# Patient Record
Sex: Female | Born: 1935 | Race: White | Hispanic: No | Smoking: Former smoker
Health system: Southern US, Community
[De-identification: ages and names within clinical notes are randomized; demographics above are authoritative.]

## PROBLEM LIST (undated history)

## (undated) DIAGNOSIS — I739 Peripheral vascular disease, unspecified: Secondary | ICD-10-CM

## (undated) DIAGNOSIS — M1990 Unspecified osteoarthritis, unspecified site: Secondary | ICD-10-CM

## (undated) DIAGNOSIS — I10 Essential (primary) hypertension: Secondary | ICD-10-CM

## (undated) DIAGNOSIS — K219 Gastro-esophageal reflux disease without esophagitis: Secondary | ICD-10-CM

## (undated) DIAGNOSIS — G629 Polyneuropathy, unspecified: Secondary | ICD-10-CM

## (undated) DIAGNOSIS — I4891 Unspecified atrial fibrillation: Secondary | ICD-10-CM

## (undated) DIAGNOSIS — M810 Age-related osteoporosis without current pathological fracture: Secondary | ICD-10-CM

## (undated) DIAGNOSIS — L97509 Non-pressure chronic ulcer of other part of unspecified foot with unspecified severity: Secondary | ICD-10-CM

## (undated) HISTORY — PX: OTHER SURGICAL HISTORY: SHX169

## (undated) HISTORY — PX: ABDOMINAL HYSTERECTOMY: SHX81

## (undated) HISTORY — PX: TONSILLECTOMY: SHX5217

## (undated) HISTORY — PX: STENT PLACEMENT ILIAC (ARMC HX): HXRAD1735

---

## 2004-10-24 ENCOUNTER — Ambulatory Visit: Payer: Self-pay | Admitting: Internal Medicine

## 2005-10-31 ENCOUNTER — Ambulatory Visit: Payer: Self-pay | Admitting: Internal Medicine

## 2006-05-22 ENCOUNTER — Ambulatory Visit: Payer: Self-pay | Admitting: Gastroenterology

## 2006-08-22 ENCOUNTER — Ambulatory Visit: Payer: Self-pay | Admitting: Gastroenterology

## 2006-08-22 ENCOUNTER — Other Ambulatory Visit: Payer: Self-pay

## 2006-11-03 ENCOUNTER — Ambulatory Visit: Payer: Self-pay | Admitting: Internal Medicine

## 2007-11-05 ENCOUNTER — Ambulatory Visit: Payer: Self-pay | Admitting: Internal Medicine

## 2008-11-17 ENCOUNTER — Ambulatory Visit: Payer: Self-pay | Admitting: Internal Medicine

## 2009-08-05 ENCOUNTER — Emergency Department: Payer: Self-pay | Admitting: Internal Medicine

## 2009-11-29 ENCOUNTER — Ambulatory Visit: Payer: Self-pay | Admitting: Internal Medicine

## 2012-07-11 ENCOUNTER — Emergency Department: Payer: Self-pay | Admitting: Emergency Medicine

## 2012-12-15 ENCOUNTER — Ambulatory Visit: Payer: Self-pay | Admitting: Vascular Surgery

## 2012-12-15 LAB — BASIC METABOLIC PANEL
Anion Gap: 6 — ABNORMAL LOW (ref 7–16)
Chloride: 109 mmol/L — ABNORMAL HIGH (ref 98–107)
Creatinine: 0.77 mg/dL (ref 0.60–1.30)
Osmolality: 281 (ref 275–301)
Potassium: 4.2 mmol/L (ref 3.5–5.1)
Sodium: 140 mmol/L (ref 136–145)

## 2012-12-15 LAB — CREATININE, SERUM
Creatinine: 1 mg/dL (ref 0.60–1.30)
EGFR (African American): 59 — ABNORMAL LOW
EGFR (Non-African Amer.): 51 — ABNORMAL LOW

## 2012-12-16 LAB — CBC WITH DIFFERENTIAL/PLATELET
Basophil #: 0 10*3/uL (ref 0.0–0.1)
Basophil %: 0.3 %
Eosinophil %: 0.1 %
HGB: 9.6 g/dL — ABNORMAL LOW (ref 12.0–16.0)
Lymphocyte %: 18.3 %
MCH: 32.2 pg (ref 26.0–34.0)
MCHC: 35 g/dL (ref 32.0–36.0)
MCV: 92 fL (ref 80–100)
Monocyte #: 0.6 x10 3/mm (ref 0.2–0.9)
Monocyte %: 7.5 %
RDW: 13.9 % (ref 11.5–14.5)
WBC: 8.4 10*3/uL (ref 3.6–11.0)

## 2012-12-17 LAB — BASIC METABOLIC PANEL
Anion Gap: 8 (ref 7–16)
BUN: 13 mg/dL (ref 7–18)
Calcium, Total: 8.2 mg/dL — ABNORMAL LOW (ref 8.5–10.1)
Co2: 26 mmol/L (ref 21–32)
EGFR (African American): >60
EGFR (Non-African Amer.): >60
Potassium: 3.8 mmol/L (ref 3.5–5.1)
Sodium: 142 mmol/L (ref 136–145)

## 2013-04-16 ENCOUNTER — Encounter: Payer: Self-pay | Admitting: Nurse Practitioner

## 2013-04-16 ENCOUNTER — Encounter: Payer: Self-pay | Admitting: Cardiothoracic Surgery

## 2013-05-02 ENCOUNTER — Encounter: Payer: Self-pay | Admitting: Cardiothoracic Surgery

## 2013-05-02 ENCOUNTER — Encounter: Payer: Self-pay | Admitting: Nurse Practitioner

## 2013-05-03 ENCOUNTER — Ambulatory Visit: Payer: Self-pay | Admitting: Vascular Surgery

## 2013-05-03 LAB — BUN: BUN: 25 mg/dL — ABNORMAL HIGH (ref 7–18)

## 2013-05-03 LAB — CREATININE, SERUM: EGFR (Non-African Amer.): 55 — ABNORMAL LOW

## 2013-06-01 ENCOUNTER — Encounter: Payer: Self-pay | Admitting: Nurse Practitioner

## 2013-06-01 ENCOUNTER — Encounter: Payer: Self-pay | Admitting: Cardiothoracic Surgery

## 2013-07-02 ENCOUNTER — Encounter: Payer: Self-pay | Admitting: Nurse Practitioner

## 2013-07-02 ENCOUNTER — Encounter: Payer: Self-pay | Admitting: Cardiothoracic Surgery

## 2013-08-02 ENCOUNTER — Encounter: Payer: Self-pay | Admitting: Cardiothoracic Surgery

## 2013-11-10 DIAGNOSIS — L97209 Non-pressure chronic ulcer of unspecified calf with unspecified severity: Secondary | ICD-10-CM | POA: Insufficient documentation

## 2013-12-14 DIAGNOSIS — M79609 Pain in unspecified limb: Secondary | ICD-10-CM | POA: Insufficient documentation

## 2014-05-25 DIAGNOSIS — I70229 Atherosclerosis of native arteries of extremities with rest pain, unspecified extremity: Secondary | ICD-10-CM | POA: Insufficient documentation

## 2014-05-25 DIAGNOSIS — L03119 Cellulitis of unspecified part of limb: Secondary | ICD-10-CM

## 2014-05-25 DIAGNOSIS — L02619 Cutaneous abscess of unspecified foot: Secondary | ICD-10-CM | POA: Insufficient documentation

## 2014-05-25 DIAGNOSIS — I998 Other disorder of circulatory system: Secondary | ICD-10-CM | POA: Insufficient documentation

## 2014-09-13 DIAGNOSIS — L97909 Non-pressure chronic ulcer of unspecified part of unspecified lower leg with unspecified severity: Secondary | ICD-10-CM | POA: Insufficient documentation

## 2015-03-24 NOTE — Discharge Summary (Signed)
PATIENT NAME:  Danielle OppenheimGRAHAM, Arna T MR#:  [id number] DATE OF BIRTH:  February 14, 1925  DATE OF ADMISSION:  12/15/2012 DATE OF DISCHARGE:  12/17/2012  DISCHARGE DIAGNOSIS:  1. Atherosclerotic occlusive disease, bilateral lower extremities, with rest pain and ulceration of the left lower extremity.  2. Hematoma complicating procedure with a right groin hematoma.   SECONDARY DIAGNOSES: 1. Hypertension.  2. Parkinson's.  3. Anxiety disorder.  4. Anemia of blood loss.  5. Diffuse degenerative joint disease.  6. Gastroesophageal reflux disease.   PROCEDURES PERFORMED: 1. Left lower extremity revascularization with percutaneous transluminal angioplasty, stent placement of the left external iliac artery, percutaneous transluminal angioplasty of the left superficial femoral artery.  2. Angiography performed via right groin approach.   CONSULTATIONS: None.   INDICATIONS: The patient is an 81 year old woman who presented to the office the day before her procedure with intense rest pain as well as ulceration of the left lower foot. Her left leg was clearly ischemic and in jeopardy, and therefore she was scheduled for angiography with the hope for intervention and revascularization for limb salvage.   HOSPITAL COURSE: On the day of admission, the patient underwent successful revascularization. Stents were placed in the above noted locations, and perfusion was significantly improved to the left foot. Attempts at using a closure device failed, and the patient sustained a moderate sized hematoma. Concomitant with this, the patient was also experiencing significant urinary retention and at the conclusion of the procedure became bradycardic as well as hypotensive. Initially, in and out catheterization resulted in improvement of her symptoms, however, they did recur after approximately 1 hour, at which point a full CT scan was obtained to help determine what was going on.  CT scan demonstrated the right groin hematoma,  moderate size, but also significant urinary retention in spite of having placed a Foley prior to the CT. Therefore, the Foley was taken down, reinserted, and this time over a liter of urine was returned. Postoperatively, the patient was taken to the intensive care unit where she did well overnight. On postoperative day #1, her  blood pressure was stable in the low 100s; however, Physical Therapy found that she was intolerant of weight-bearing on her left foot secondary to the pain. She was experiencing significant reperfusion symptoms and hyperemia of her foot. Doppler signals were excellent. After therapy evaluation, it was felt the patient would benefit from short-term rehabilitation and PT, and, therefore, she was transferred from the unit to the floor on post procedure day #2. [facility] She is actually starting to ambulate better. She was able to cruise around the room short distances from using furniture as an aid.  Her pain is much better, and she has not been using large quantities of narcotics. In fact, she at this point has had just 2 doses of narcotics this day. She is otherwise doing very well and felt fit for discharge to rehab.   DISPOSITION: She will be discharged to skilled nursing/ rehab. She is in improved condition. Her left foot is now warm and well perfused. She is weight-bearing as tolerated. Diet is regular. Medications are as noted in the discharge instructions.  ____________________________ Renford DillsGregory G. Lumi Winslett, MD ggs:cb D: 12/17/2012 16:39:27 ET T: 12/17/2012 17:16:49 ET JOB#: [id number]  cc: Renford DillsGregory G. Zeta Bucy, MD, <Dictator> Barbette ReichmannVishwanath Hande, MD [facility] Renford DillsGREGORY G Kathyrn Warmuth MD ELECTRONICALLY SIGNED 12/25/2012 10:02

## 2015-03-24 NOTE — Op Note (Signed)
PATIENT NAME:  [name], [name] MR#:  [id number] DATE OF BIRTH:  [birth year]  DATE OF PROCEDURE:  12/15/2012  PREOPERATIVE DIAGNOSES:   1.  Atherosclerotic occlusive disease, bilateral lower extremities, with ulceration and rest pain of the left lower extremity.  2.  Venous insufficiency.  3.  Varicose veins with venous ulceration, left ankle.   POSTOPERATIVE DIAGNOSES:   1.  Atherosclerotic occlusive disease, bilateral lower extremities, with ulceration and rest pain of the left lower extremity.  2.  Venous insufficiency.  3.  Varicose veins with venous ulceration, left ankle.   PROCEDURES PERFORMED:   1.  Abdominal aortogram.  2.  Left lower extremity distal runoff, third order catheter placement.  3.  Crosser atherectomy, left SFA.  4.  Percutaneous transluminal angioplasty and stent placement, left SFA.  5.  Percutaneous transluminal angioplasty and stent placement, left external iliac artery.   SURGEON:  Renford Dills, MD   SEDATION:  Versed 5 mg plus fentanyl 200 mcg administered IV. Continuous ECG, pulse oximetry and cardiopulmonary monitoring is performed throughout the entire procedure by the interventional radiology nurse. Total sedation time was 1 hour and 40 minutes.   ACCESS:  7 French sheath, right common femoral artery.   CONTRAST USED:  Isovue 110 mL.   FLUOROSCOPY TIME:  19.1 minutes.   INDICATIONS:  The patient is an 81 year old woman who presented to the office with increased pain in her left lower extremity associated with an ulcer that has been present since August. Physical examination demonstrated lack of pulses and trophic changes along with venous stasis changes. The patient appears to have a mixed ulcer which originated as a venous ulcer, but is now nonhealing secondary to her atherosclerotic occlusive disease. She is also describing dangling her leg off the side of the bed at night and increasing pain consistently throughout the day which has lessened  slightly with dependency, all suggestive of rest pain. The risks and benefits for angiography and the hope for intervention were reviewed. All questions have been answered. The patient has agreed to proceed.   DESCRIPTION OF PROCEDURE:  The patient is taken to special procedures and placed in the supine position. After adequate sedation is achieved, the right groin is prepped and draped in sterile fashion. Ultrasound is placed in a sterile sleeve. Ultrasound is utilized secondary to lack of appropriate landmarks and to avoid vascular injury. Under direct ultrasound visualization, the common femoral artery is identified. It is scanned distally to demonstrate the bifurcation and then scanned more proximally to select the common femoral proper. The artery is noted to be echolucent, homogeneous and pulsatile indicating patency. Image is recorded for the permanent record and after 1% lidocaine is infiltrated in the soft tissues under direct ultrasound visualization, a micropuncture needle is inserted, microwire followed micro sheath, J-wire followed by 5 French sheath and 5 French pigtail catheter. The pigtail catheter is positioned at the level of T12 and AP projection of the aorta is obtained. After review of the images, pigtail catheter is repositioned to just above the bifurcation and RAO projection of the pelvis is obtained. Rim catheter and stiff angled Glidewire are then used to cross the bifurcation. The catheter is advanced down into the profunda and hand injection of contrast is then used to evaluate the left lower extremity distal runoff. Flush occlusion of the SFA is noted with reconstitution of the above knee or at knee popliteal. There appears to be 2-vessel runoff to the foot.   Then 4000 units of heparin  are given and the sheath is then upsized to a 7 [place]French Ansel. The Ansel is advanced up and over the bifurcation positioned with its tip in the common femoral. An angled Usher catheter was then  utilized in combination with a S6 device in the Crosser atherectomy which has been prepped on the field. The S6 Usher catheter is then used to engage the SFA and the Crosser catheter is advanced down to the mid popliteal. Subsequently, a combination of catheters and Glidewires are used to negotiate this and the catheter is advanced down into the distal popliteal where hand injection of contrast is used to perform distal runoff and verify intraluminal placement. A 0.014 wire is reintroduced and a 3.5 x 30 balloon is used to angioplasty the proximal popliteal and SFA after 2 serial angioplasties. Followup angiography demonstrates there is a high-grade residual stenosis in the proximal SFA along with a dissection and therefore, a 5 x 80 LifeStent is deployed across this region. Its leading edge is approximately 2 to 3 cm below the bifurcation. It is post dilated with a 4 x 80 balloon.   Attention is then turned down to the reentry at Hunter's canal. There still appears to be some haziness and the 3.5 balloon is once again used to angioplasty this area. A followup angiography demonstrates an excellent result without flow limitation. The sheath is then pulled into the common iliac. Magnified oblique views of the external iliac are obtained demonstrating a midportion greater than 70% stenosis and a 6 x 40 LIFESTAR stent is deployed across this and postdilated to 5 mm. A sheath is then pulled into the external on the right and subsequently an oblique view is obtained. ProGlide is attempted. The knot does not capture and a Mynx device is deployed. Mynx appears to be hemostatic; however, the patient did become bradycardic and her blood pressure did drop, but she was noted to have a very large bladder and this situation was resolved with 1 dose of atropine, a small bolus of fluids and subsequently an in-and-out catheter which returned over a liter of urine.   The patient tolerated the procedure otherwise quite well and  was taken to the recovery area in excellent condition.   INTERPRETATION:  There are diffuse atherosclerotic changes throughout. There are no hemodynamically significant lesions within the aorta or the common iliac arteries bilaterally. There is a mid-external iliac on the left lesion of approximately 70%. Common femoral is patent as is the profunda. The superficial femoral artery is a flush occlusion at the common femoral with reconstitution of the mid popliteal. There appears to be 2-vessel runoff. Following angioplasty to 3.5 mm and subsequently a stent and angioplasty at 4 mm proximally, there is patency now of the SFA and popliteal. Following angioplasty and stent placement in the external iliac, there is complete resolution of the stenosis.   SUMMARY:  Successful revascularization of the left lower extremity as described above.   ____________________________ Renford DillsGregory G. Schnier, MD ggs:si D: 12/15/2012 17:59:00 ET T: 12/16/2012 00:01:35 ET JOB#: [id number]  cc: Renford DillsGregory G. Schnier, MD, <Dictator> Barbette ReichmannVishwanath Hande, MD  Renford DillsGREGORY G SCHNIER MD ELECTRONICALLY SIGNED 12/25/2012 10:01

## 2015-03-24 NOTE — Op Note (Signed)
PATIENT NAME:  [name], [name] MR#:  [id number] DATE OF BIRTH:  [birth year]  DATE OF PROCEDURE:  05/03/2013  PREOPERATIVE DIAGNOSES:  1.  Peripheral arterial disease with ulceration.  2.  Hypertension.   POSTOPERATIVE DIAGNOSES: 1.  Peripheral arterial disease with ulceration.  2.  Hypertension.   PROCEDURE:  1.  Catheter placement into left popliteal artery from right femoral approach.  2.  Aortogram, selective left lower extremity angiogram.  3.  Percutaneous transluminal angioplasty of entire left superficial femoral artery and above-knee popliteal artery with 4 mm diameter angioplasty balloon.  4.  Percutaneous transluminal angioplasty of proximal superficial femoral artery with 5 mm diameter angioplasty balloon.  5.  Self-expanding stent placement to mid superficial femoral artery with 5 mm diameter x 12 cm self-expanding stent for greater than 50% residual stenosis and dissection after angioplasty.  6.  StarClose closure device, right femoral artery.   SURGEON: Annice NeedyJason S Dew, M.D.   ANESTHESIA: Local with moderate conscious sedation.   ESTIMATED BLOOD LOSS: Minimal.   INDICATION FOR PROCEDURE: An 81 year old white female with severe peripheral vascular disease. She has had previous intervention. She has nonhealing ulceration of left lower extremity. She is brought back for an attempt at revascularization. Risks and benefits were discussed. Informed consent was obtained.   DESCRIPTION OF PROCEDURE: The patient is brought to the vascular interventional radiology suite. Groins were shaved and prepped and a sterile surgical field was created. The right femoral head was localized with fluoroscopy. The right femoral artery was accessed without difficulty with  a Seldinger needle. A J-wire and 5-French sheath were placed. Pigtail catheter was placed at noted at the L1 level and AP aortogram was performed. This showed some diffuse aortoiliac calcification with some mild stenosis in the iliacs  that was not flow limiting. I then hooked the aortic bifurcation and advanced to the left femoral head and selective left lower extremity angiogram was then performed. This showed basically a string sign of the majority of the SFA. The previous left SFA stent was actually open. The above-knee popliteal artery reconstituted just above the knee and she then had two-vessel runoff distally. The patient was systemically heparinized. A 6-French Ansel sheath was placed over a Terumo Advantage wire that would cross the lesion without difficulty and confirm intraluminal flow in the popliteal artery at the level of the knee. I then replaced the wire. A 4 mm diameter angioplasty balloon was inflated from just above the knee to the common femoral artery. In the upper leg, a 5 mm diameter angioplasty balloon was also used. There was still residual disease just below the previously placed stent and a 5 mm diameter x 12 cm self-expanding stent was used to encompass the lesion with dissection greater than 50% residual stenosis. This was ironed out with a 5 mm balloon. The angiographic result was good. Sheaths were pulled back to the ipsilateral external iliac artery. Oblique atrium was performed. StarClose closure device was deployed in the usual fashion and excellent hemostasis was achieved. The patient was then awakened from anesthesia and taken to the recovery room in stable condition having tolerated the procedure well.   ____________________________ Annice NeedyJason S. Dew, MD jsd:aw D: 05/03/2013 09:40:02 ET Good: 05/03/2013 10:30:30 ET JOB#: [id number]  cc: Annice NeedyJason S. Dew, MD, <Dictator> Barbette ReichmannVishwanath Hande, MD Annice NeedyJASON S DEW MD ELECTRONICALLY SIGNED 05/12/2013 10:55

## 2015-06-09 DIAGNOSIS — M810 Age-related osteoporosis without current pathological fracture: Secondary | ICD-10-CM | POA: Insufficient documentation

## 2015-06-09 DIAGNOSIS — K589 Irritable bowel syndrome without diarrhea: Secondary | ICD-10-CM | POA: Insufficient documentation

## 2015-10-23 ENCOUNTER — Emergency Department: Payer: Medicare Other

## 2015-10-23 ENCOUNTER — Inpatient Hospital Stay: Payer: Medicare Other

## 2015-10-23 ENCOUNTER — Inpatient Hospital Stay
Admission: EM | Admit: 2015-10-23 | Discharge: 2015-10-25 | DRG: 291 | Disposition: A | Discharge status: Home / Self Care | Payer: Medicare Other | Attending: Internal Medicine | Admitting: Internal Medicine

## 2015-10-23 ENCOUNTER — Encounter: Payer: Self-pay | Admitting: Emergency Medicine

## 2015-10-23 DIAGNOSIS — I509 Heart failure, unspecified: Secondary | ICD-10-CM

## 2015-10-23 DIAGNOSIS — I739 Peripheral vascular disease, unspecified: Secondary | ICD-10-CM | POA: Diagnosis present

## 2015-10-23 DIAGNOSIS — I071 Rheumatic tricuspid insufficiency: Secondary | ICD-10-CM | POA: Diagnosis present

## 2015-10-23 DIAGNOSIS — Z87891 Personal history of nicotine dependence: Secondary | ICD-10-CM | POA: Diagnosis not present

## 2015-10-23 DIAGNOSIS — Z885 Allergy status to narcotic agent status: Secondary | ICD-10-CM | POA: Diagnosis not present

## 2015-10-23 DIAGNOSIS — Z7902 Long term (current) use of antithrombotics/antiplatelets: Secondary | ICD-10-CM

## 2015-10-23 DIAGNOSIS — I5033 Acute on chronic diastolic (congestive) heart failure: Secondary | ICD-10-CM | POA: Diagnosis present

## 2015-10-23 DIAGNOSIS — J9601 Acute respiratory failure with hypoxia: Secondary | ICD-10-CM | POA: Diagnosis present

## 2015-10-23 DIAGNOSIS — G629 Polyneuropathy, unspecified: Secondary | ICD-10-CM | POA: Diagnosis present

## 2015-10-23 DIAGNOSIS — I482 Chronic atrial fibrillation: Secondary | ICD-10-CM | POA: Diagnosis present

## 2015-10-23 DIAGNOSIS — L8990 Pressure ulcer of unspecified site, unspecified stage: Secondary | ICD-10-CM | POA: Insufficient documentation

## 2015-10-23 DIAGNOSIS — Z888 Allergy status to other drugs, medicaments and biological substances status: Secondary | ICD-10-CM | POA: Diagnosis not present

## 2015-10-23 DIAGNOSIS — I110 Hypertensive heart disease with heart failure: Secondary | ICD-10-CM | POA: Diagnosis not present

## 2015-10-23 DIAGNOSIS — Z881 Allergy status to other antibiotic agents status: Secondary | ICD-10-CM | POA: Diagnosis not present

## 2015-10-23 DIAGNOSIS — M810 Age-related osteoporosis without current pathological fracture: Secondary | ICD-10-CM | POA: Diagnosis present

## 2015-10-23 DIAGNOSIS — M1990 Unspecified osteoarthritis, unspecified site: Secondary | ICD-10-CM | POA: Diagnosis present

## 2015-10-23 DIAGNOSIS — R0902 Hypoxemia: Secondary | ICD-10-CM

## 2015-10-23 DIAGNOSIS — R0602 Shortness of breath: Secondary | ICD-10-CM | POA: Diagnosis not present

## 2015-10-23 DIAGNOSIS — Z882 Allergy status to sulfonamides status: Secondary | ICD-10-CM | POA: Diagnosis not present

## 2015-10-23 DIAGNOSIS — J069 Acute upper respiratory infection, unspecified: Secondary | ICD-10-CM | POA: Diagnosis present

## 2015-10-23 DIAGNOSIS — L89522 Pressure ulcer of left ankle, stage 2: Secondary | ICD-10-CM | POA: Diagnosis present

## 2015-10-23 DIAGNOSIS — K219 Gastro-esophageal reflux disease without esophagitis: Secondary | ICD-10-CM | POA: Diagnosis present

## 2015-10-23 DIAGNOSIS — Z79899 Other long term (current) drug therapy: Secondary | ICD-10-CM | POA: Diagnosis not present

## 2015-10-23 HISTORY — DX: Unspecified osteoarthritis, unspecified site: M19.90

## 2015-10-23 HISTORY — DX: Unspecified atrial fibrillation: I48.91

## 2015-10-23 HISTORY — DX: Polyneuropathy, unspecified: G62.9

## 2015-10-23 HISTORY — DX: Non-pressure chronic ulcer of other part of unspecified foot with unspecified severity: L97.509

## 2015-10-23 HISTORY — DX: Age-related osteoporosis without current pathological fracture: M81.0

## 2015-10-23 HISTORY — DX: Peripheral vascular disease, unspecified: I73.9

## 2015-10-23 HISTORY — DX: Gastro-esophageal reflux disease without esophagitis: K21.9

## 2015-10-23 LAB — CBC
HCT: 43.2 % (ref 35.0–47.0)
HEMOGLOBIN: 14.1 g/dL (ref 12.0–16.0)
MCH: 30.3 pg (ref 26.0–34.0)
MCHC: 32.6 g/dL (ref 32.0–36.0)
MCV: 92.9 fL (ref 80.0–100.0)
Platelets: 157 10*3/uL (ref 150–440)
RBC: 4.66 MIL/uL (ref 3.80–5.20)
RDW: 15.1 % — AB (ref 11.5–14.5)
WBC: 6.4 10*3/uL (ref 3.6–11.0)

## 2015-10-23 LAB — BASIC METABOLIC PANEL
Anion gap: 8 (ref 5–15)
BUN: 16 mg/dL (ref 6–20)
CO2: 21 mmol/L — ABNORMAL LOW (ref 22–32)
CREATININE: 0.91 mg/dL (ref 0.44–1.00)
Calcium: 9 mg/dL (ref 8.9–10.3)
Chloride: 101 mmol/L (ref 101–111)
GFR calc Af Amer: >60 mL/min (ref >60)
GFR, EST NON AFRICAN AMERICAN: 55 mL/min — AB (ref >60)
GLUCOSE: 77 mg/dL (ref 65–99)
POTASSIUM: 4.2 mmol/L (ref 3.5–5.1)
SODIUM: 130 mmol/L — AB (ref 135–145)

## 2015-10-23 LAB — BRAIN NATRIURETIC PEPTIDE: B NATRIURETIC PEPTIDE 5: 654 pg/mL — AB (ref 0.0–100.0)

## 2015-10-23 LAB — TROPONIN I: Troponin I: 0.03 ng/mL (ref <0.031)

## 2015-10-23 MED ORDER — SIMVASTATIN 10 MG PO TABS
10.0000 mg | ORAL_TABLET | Freq: Every day | ORAL | Status: DC
  Administered 2015-10-23 – 2015-10-24 (×2): 10 mg via ORAL
  Filled 2015-10-23 (×2): qty 1

## 2015-10-23 MED ORDER — METOPROLOL TARTRATE 50 MG PO TABS
50.0000 mg | ORAL_TABLET | Freq: Two times a day (BID) | ORAL | Status: DC
  Administered 2015-10-24 – 2015-10-25 (×2): 50 mg via ORAL
  Filled 2015-10-23 (×3): qty 1

## 2015-10-23 MED ORDER — HYDROCODONE-ACETAMINOPHEN 5-325 MG PO TABS
1.0000 | ORAL_TABLET | Freq: Two times a day (BID) | ORAL | Status: DC | PRN
Start: 2015-10-23 — End: 2015-10-25

## 2015-10-23 MED ORDER — ACETAMINOPHEN 325 MG PO TABS
650.0000 mg | ORAL_TABLET | Freq: Four times a day (QID) | ORAL | Status: DC | PRN
  Administered 2015-10-24: 650 mg via ORAL
  Filled 2015-10-23: qty 2

## 2015-10-23 MED ORDER — NITROGLYCERIN 2 % TD OINT
0.5000 [in_us] | TOPICAL_OINTMENT | Freq: Once | TRANSDERMAL | Status: AC
  Administered 2015-10-23: 0.5 [in_us] via TOPICAL
  Filled 2015-10-23: qty 1

## 2015-10-23 MED ORDER — ALPRAZOLAM 0.25 MG PO TABS: 0.2500 mg | ORAL_TABLET | Freq: Every evening | ORAL | Status: DC | PRN

## 2015-10-23 MED ORDER — LOSARTAN POTASSIUM 50 MG PO TABS
50.0000 mg | ORAL_TABLET | Freq: Every day | ORAL | Status: DC
Start: 2015-10-24 — End: 2015-10-24

## 2015-10-23 MED ORDER — GABAPENTIN 300 MG PO CAPS
300.0000 mg | ORAL_CAPSULE | Freq: Two times a day (BID) | ORAL | Status: DC
  Administered 2015-10-23 – 2015-10-25 (×4): 300 mg via ORAL
  Filled 2015-10-23 (×4): qty 1

## 2015-10-23 MED ORDER — ENALAPRILAT 1.25 MG/ML IV SOLN
0.6250 mg | Freq: Once | INTRAVENOUS | Status: AC
  Administered 2015-10-23: 0.625 mg via INTRAVENOUS
  Filled 2015-10-23: qty 2

## 2015-10-23 MED ORDER — CLOPIDOGREL BISULFATE 75 MG PO TABS
75.0000 mg | ORAL_TABLET | Freq: Every day | ORAL | Status: DC
  Administered 2015-10-24 – 2015-10-25 (×2): 75 mg via ORAL
  Filled 2015-10-23 (×2): qty 1

## 2015-10-23 MED ORDER — FAMOTIDINE 20 MG PO TABS
20.0000 mg | ORAL_TABLET | Freq: Two times a day (BID) | ORAL | Status: DC
  Administered 2015-10-24 – 2015-10-25 (×3): 20 mg via ORAL
  Filled 2015-10-23 (×3): qty 1

## 2015-10-23 MED ORDER — POLYETHYLENE GLYCOL 3350 17 G PO PACK
17.0000 g | PACK | Freq: Every day | ORAL | Status: DC | PRN
Start: 2015-10-23 — End: 2015-10-25

## 2015-10-23 MED ORDER — DOCUSATE SODIUM 100 MG PO CAPS
100.0000 mg | ORAL_CAPSULE | Freq: Two times a day (BID) | ORAL | Status: DC | PRN
  Filled 2015-10-23: qty 1

## 2015-10-23 MED ORDER — ASPIRIN 81 MG PO CHEW
81.0000 mg | CHEWABLE_TABLET | Freq: Every day | ORAL | Status: DC
  Administered 2015-10-23 – 2015-10-25 (×3): 81 mg via ORAL
  Filled 2015-10-23 (×3): qty 1

## 2015-10-23 MED ORDER — ACETAMINOPHEN 650 MG RE SUPP: 650.0000 mg | Freq: Four times a day (QID) | RECTAL | Status: DC | PRN

## 2015-10-23 MED ORDER — ENOXAPARIN SODIUM 40 MG/0.4ML ~~LOC~~ SOLN
40.0000 mg | SUBCUTANEOUS | Status: DC
Start: 2015-10-23 — End: 2015-10-24

## 2015-10-23 MED ORDER — FUROSEMIDE 10 MG/ML IJ SOLN
40.0000 mg | Freq: Once | INTRAMUSCULAR | Status: AC
  Administered 2015-10-23: 40 mg via INTRAVENOUS
  Filled 2015-10-23: qty 4

## 2015-10-23 MED ORDER — FLUTICASONE PROPIONATE 50 MCG/ACT NA SUSP
2.0000 | Freq: Every evening | NASAL | Status: DC | PRN
  Administered 2015-10-24: 2 via NASAL
  Filled 2015-10-23 (×3): qty 16

## 2015-10-23 MED ORDER — ROPINIROLE HCL 1 MG PO TABS
2.0000 mg | ORAL_TABLET | Freq: Every day | ORAL | Status: DC
  Administered 2015-10-23 – 2015-10-24 (×2): 2 mg via ORAL
  Filled 2015-10-23 (×2): qty 2

## 2015-10-23 MED ORDER — VITAMIN D (ERGOCALCIFEROL) 1.25 MG (50000 UNIT) PO CAPS: 50000.0000 [IU] | ORAL_CAPSULE | ORAL | Status: DC

## 2015-10-23 MED ORDER — FUROSEMIDE 10 MG/ML IJ SOLN
20.0000 mg | Freq: Three times a day (TID) | INTRAMUSCULAR | Status: DC
  Administered 2015-10-23: 20 mg via INTRAVENOUS
  Filled 2015-10-23: qty 2

## 2015-10-23 MED ORDER — DIGOXIN 125 MCG PO TABS
0.1250 mg | ORAL_TABLET | Freq: Every day | ORAL | Status: DC
Start: 2015-10-24 — End: 2015-10-25
  Administered 2015-10-25: 0.125 mg via ORAL
  Filled 2015-10-23: qty 1

## 2015-10-23 MED ORDER — DOCUSATE SODIUM 100 MG PO CAPS
100.0000 mg | ORAL_CAPSULE | Freq: Two times a day (BID) | ORAL | Status: DC
  Administered 2015-10-23 – 2015-10-25 (×4): 100 mg via ORAL
  Filled 2015-10-23 (×3): qty 1

## 2015-10-23 MED ORDER — SODIUM CHLORIDE 0.9 % IJ SOLN
3.0000 mL | Freq: Two times a day (BID) | INTRAMUSCULAR | Status: DC
  Administered 2015-10-23: 3 mL via INTRAVENOUS

## 2015-10-23 NOTE — ED Notes (Signed)
Patient transported to room 252 

## 2015-10-23 NOTE — H&P (Signed)
[facility] [place] [facility]   PATIENT NAME: [name]    MR#:  [id number]  DATE OF BIRTH:  05/07/1925  DATE OF ADMISSION:  10/23/2015  PRIMARY CARE PHYSICIAN: Barbette ReichmannHANDE,VISHWANATH, MD   REQUESTING/REFERRING PHYSICIAN: Dr. Janalyn Harderavid Kaminski  CHIEF COMPLAINT:   Chief Complaint  Patient presents with  . Shortness of Breath    HISTORY OF PRESENT ILLNESS:  Danielle CocoGenelle Picklesimer  is a 81 y.o. female with a known history of atrial fibrillation, recently taken off of anticoagulation, peripheral arterial disease, osteoporosis and arthritis presents to the hospital secondary to difficulty breathing going on for 3 days now. Patient had congestive heart failure diagnosis in the past, according to daughter no recent trouble with her breathing. She denies any chest pain. Patient does not follow a strict low-sodium diet. She in fact eats a lot of salt. Noticed any swelling of her ankles. She notices that her breathing started to get worse or the last 3 days. She usually is able to walk around the house and able to perform simple activities. But lately has been getting more short of breath. She is not on any home oxygen. She was advised to come to the urgent care due to her breathing today. Over there she was noted to be hypoxic requiring 4 L of oxygen. Chest x-ray revealed pulmonary edema. So she is being admitted for CHF exacerbation. Patient denies any chest pain, nausea or vomiting. No fevers. She has been having occasional spells of dizziness for which reason her eliquis has been discontinued recently.  PAST MEDICAL HISTORY:   Past Medical History  Diagnosis Date  . Atrial fibrillation (HCC)   . PAD (peripheral artery disease) (HCC)     s/p bypass and left leg stents  . Osteoporosis   . Peripheral neuropathy (HCC)   . Degenerative joint disease   . Degenerative arthritis   . Foot ulcer (HCC)     Chronic left ankle ulcer  . GERD (gastroesophageal reflux disease)      PAST SURGICAL HISTORY:   Past Surgical History  Procedure Laterality Date  . Femoral bypass surgery      left leg  . Stent placement iliac ([facility] hx)      Left leg  . Abdominal hysterectomy      SOCIAL HISTORY:   Social History  Substance Use Topics  . Smoking status: Former Games developermoker  . Smokeless tobacco: Not on file     Comment: Quit 10 years ago  . Alcohol Use: No    FAMILY HISTORY:   Family History  Problem Relation Age of Onset  . Peripheral vascular disease Father     DRUG ALLERGIES:   Allergies  Allergen Reactions  . Alendronate Other (See Comments)    Reaction:  Unknown   . Doxycycline Nausea And Vomiting  . Minocycline Nausea And Vomiting  . Oxycodone Other (See Comments)    Reaction:  Hallucinations   . Sulfa Antibiotics Nausea And Vomiting    REVIEW OF SYSTEMS:   Review of Systems  Constitutional: Negative for fever, chills and weight loss.  HENT: Negative for ear discharge, ear pain, hearing loss, nosebleeds and tinnitus.   Eyes: Positive for blurred vision. Negative for double vision and photophobia.  Respiratory: Positive for shortness of breath. Negative for cough, hemoptysis and wheezing.   Cardiovascular: Positive for palpitations. Negative for chest pain, orthopnea and leg swelling.  Gastrointestinal: Positive for heartburn. Negative for nausea, vomiting, abdominal pain, diarrhea, constipation and melena.  Genitourinary: Negative  for dysuria, urgency, frequency and hematuria.  Musculoskeletal: Positive for joint pain. Negative for myalgias, back pain and neck pain.  Skin: Negative for rash.  Neurological: Positive for dizziness and weakness. Negative for tingling, tremors, sensory change, speech change, focal weakness and headaches.  Endo/Heme/Allergies: Does not bruise/bleed easily.  Psychiatric/Behavioral: Negative for depression.    MEDICATIONS AT HOME:   Prior to Admission medications   Medication Sig Start Date End Date Taking?  Authorizing Provider  ALPRAZolam Prudy Feeler) 0.25 MG tablet Take 0.25 mg by mouth at bedtime as needed for sleep.   Yes Historical Provider, MD  cetirizine-pseudoephedrine (ZYRTEC-D) 5-120 MG tablet Take 1 tablet by mouth 2 (two) times daily as needed for allergies.   Yes Historical Provider, MD  clopidogrel (PLAVIX) 75 MG tablet Take 75 mg by mouth daily.   Yes Historical Provider, MD  digoxin (LANOXIN) 0.125 MG tablet Take 0.125 mg by mouth daily.   Yes Historical Provider, MD  docusate sodium (COLACE) 100 MG capsule Take 100 mg by mouth 2 (two) times daily as needed for mild constipation.   Yes Historical Provider, MD  fluticasone (FLONASE) 50 MCG/ACT nasal spray Place 2 sprays into both nostrils at bedtime as needed for rhinitis.   Yes Historical Provider, MD  furosemide (LASIX) 20 MG tablet Take 20 mg by mouth daily.   Yes Historical Provider, MD  gabapentin (NEURONTIN) 300 MG capsule Take 300 mg by mouth 2 (two) times daily.    Yes Historical Provider, MD  HYDROcodone-acetaminophen (NORCO/VICODIN) 5-325 MG tablet Take 1 tablet by mouth 2 (two) times daily as needed for moderate pain.   Yes Historical Provider, MD  losartan (COZAAR) 50 MG tablet Take 50 mg by mouth daily.   Yes Historical Provider, MD  metoprolol (LOPRESSOR) 50 MG tablet Take 50 mg by mouth 2 (two) times daily.   Yes Historical Provider, MD  phenylephrine (SUDAFED PE) 10 MG TABS tablet Take 10 mg by mouth every 4 (four) hours as needed (for congestion).   Yes Historical Provider, MD  ranitidine (ZANTAC) 150 MG tablet Take 150 mg by mouth 2 (two) times daily as needed for heartburn.   Yes Historical Provider, MD  rOPINIRole (REQUIP) 2 MG tablet Take 2 mg by mouth at bedtime.   Yes Historical Provider, MD  simvastatin (ZOCOR) 10 MG tablet Take 10 mg by mouth at bedtime.   Yes Historical Provider, MD  Vitamin D, Ergocalciferol, (DRISDOL) 50000 UNITS CAPS capsule Take 50,000 Units by mouth every 7 (seven) days. Pt takes on Tuesday.    Yes Historical Provider, MD      VITAL SIGNS:  Blood pressure 166/98, pulse 54, temperature 97.6 F (36.4 C), temperature source Oral, resp. rate 22, height 5' (1.524 m), weight 48.535 kg (107 lb), SpO2 100 %.  PHYSICAL EXAMINATION:   Physical Exam  GENERAL:  81 y.o.-year-old elderly patient lying in the bed with no acute distress.  EYES: Pupils equal, round, reactive to light and accommodation. No scleral icterus. Extraocular muscles intact.  HEENT: Head atraumatic, normocephalic. Oropharynx and nasopharynx clear.  NECK:  Supple, No thyroid enlargement, no tenderness. Jugular venous distention present on exam LUNGS: Normal breath sounds bilaterally, no wheezing or rhonchi. Fine bibasilar crackles are present. No use of accessory muscles of respiration.  CARDIOVASCULAR: S1, S2 normal. No  rubs, or gallops. 3/6 systolic murmur is present ABDOMEN: Soft, nontender, nondistended. Bowel sounds present. No organomegaly or mass.  EXTREMITIES: No pedal edema, cyanosis, or clubbing. There is a healing left lateral ankle wounds  present. NEUROLOGIC: Cranial nerves II through XII are intact. Muscle strength 5/5 in all extremities. Sensation intact. Gait not checked.  PSYCHIATRIC: The patient is alert and oriented x 3.  SKIN: No obvious rash, lesion. Healing left lateral ankle wound.  LABORATORY PANEL:   CBC  Recent Labs Lab 10/23/15 1726  WBC 6.4  HGB 14.1  HCT 43.2  PLT 157   ------------------------------------------------------------------------------------------------------------------  Chemistries   Recent Labs Lab 10/23/15 1528  NA 130*  K 4.2  CL 101  CO2 21*  GLUCOSE 77  BUN 16  CREATININE 0.91  CALCIUM 9.0   ------------------------------------------------------------------------------------------------------------------  Cardiac Enzymes  Recent Labs Lab 10/23/15 1528  TROPONINI <0.03    ------------------------------------------------------------------------------------------------------------------  RADIOLOGY:  Dg Chest 2 View  10/23/2015  CLINICAL DATA:  Hypoxia. Shortness of breath. Cough. Symptoms started on Friday. EXAM: CHEST  2 VIEW COMPARISON:  Port from 12/22/2014 FINDINGS: Moderate enlargement of the cardiopericardial silhouette. Tortuous and atherosclerotic thoracic aorta. Small right pleural effusion. Indistinct pulmonary vasculature with accentuated interstitium especially in the lung bases. Suspected Kerley B-lines. Expanded AP diameter of the chest suggesting COPD. Thoracic spondylosis. Thoracic kyphosis. Dextroconvex upper thoracic scoliosis. Right greater than left apical pleural parenchymal scarring. IMPRESSION: 1. Moderate enlargement of the cardiopericardial silhouette with Kerley B-lines and interstitial accentuation suggesting interstitial pulmonary edema. 2. Small right pleural effusion. 3. Atherosclerotic aorta. Electronically Signed   By: Gaylyn Rong M.D.   On: 10/23/2015 16:14    EKG:   Orders placed or performed during the hospital encounter of 10/23/15  . ED EKG within 10 minutes  . ED EKG within 10 minutes  . EKG 12-Lead  . EKG 12-Lead    IMPRESSION AND PLAN:   Tasha Diaz  is a 81 y.o. female with a known history of atrial fibrillation, recently taken off of anticoagulation, peripheral arterial disease, osteoporosis and arthritis presents to the hospital secondary to difficulty breathing going on for 3 days now.  #1 Acute CHF exacerbation- unknown ejection fraction at baseline. -Admit to telemetry, start Lasix   IV 3 times a day, follow-up chest x-ray in a couple of days. -Echocardiogram ordered, cardiology consulted. -Check basic metabolic panel every day and adjust Lasix dose -Low sodium diet here and also counseled about low sodium diet. Strict input and output monitoring  #2 acute respiratory distress secondary to CHF  exacerbation-on 4 L oxygen now. -Continue diuresis and wean oxygen as tolerated.  #3 atrial fibrillation-rate controlled. Patient on metoprolol. -Also on digoxin. Anticoagulation discontinued recently due to dizziness and risk of falls with her age. -Maybe aspirin can be started. She is only on Plavix at this time.  #4 peripheral vascular disease-status post stents and bypass in her left leg. Continue Plavix.  #5 hypertension-continue losartan and metoprolol  #6 DVT prophylaxis-on Lovenox here  Physical therapy consult requested   All the records are reviewed and case discussed with ED provider. Management plans discussed with the patient, family and they are in agreement.  CODE STATUS: Full code  TOTAL TIME TAKING CARE OF THIS PATIENT: 50 minutes.    Enid Baas M.D on 10/23/2015 at 7:06 PM  Between 7am to 6pm - Pager - [phone]  After 6pm go to [email or web address] - password [email or web address] [facility]  [facility]  Office  [phone]  CC: Primary care physician; Barbette Reichmann, MD

## 2015-10-23 NOTE — ED Notes (Signed)
Assessment completed:   Gen: Sitting in bed, no acute distress. Very pleasant CV : irregularly irregular, +radial pulses palp bilaterally, fingers with delayed cap refill.  Pulm: mildly labored, diminished bilaterally. Productive cough.  Abd: Soft, non-tender Extremities: Warm, pt states when she is cold her fingertips turn purple.  Skin : wound dressed to left heel, laterally.   PIV : #20g left forearm (repeat labs sent)

## 2015-10-23 NOTE — ED Provider Notes (Signed)
[facility] Emergency Department Provider Note  ____________________________________________  Time seen: 1600 p.m.   I have reviewed the triage vital signs and the nursing notes.  History by: Primarily by daughter, but also from patient.  HISTORY  Chief Complaint Shortness of Breath     HPI [name] is a 81 y.o. female who has a history of vascular disease but no known pulmonary problems. She has been having increased shortness of breath through the weekend. She called her primary physician, Dr. Eston Esters office, at [place]. He is not available and they were referred to [facility] acute care. Upon arrival at [facility] acute care, the initial evaluation noted that she was hypoxic.The patient was then sent directly to the emergency department.  Upon arrival, the patient was having some shortness of breath. She had an oxygen saturation level 91% on 4 L. She is alert and communicative. She denies any chest pain. She does report ongoing shortness of breath. She denies any chest pain.   Past Medical History  Diagnosis Date  . Atrial fibrillation (HCC)     There are no active problems to display for this patient.   Past Surgical History  Procedure Laterality Date  . Cardiac surgery      No current outpatient prescriptions on file.  Allergies Alendronate; Doxycycline; Minocycline; Oxycodone; and Sulfa antibiotics  No family history on file.  Social History Social History  Substance Use Topics  . Smoking status: Former Games developer  . Smokeless tobacco: None  . Alcohol Use: No    Review of Systems  Constitutional: Negative for fever/chills. ENT: Negative for congestion. Cardiovascular: Negative for chest pain. Respiratory: Shortness of breath. See history of present illness Gastrointestinal: Negative for abdominal pain, vomiting and diarrhea. Genitourinary: Negative for dysuria. Musculoskeletal: No myalgias or injuries. Skin: Negative  for rash. Neurological: Negative for headache or focal weakness   10-point ROS otherwise negative.  ____________________________________________   PHYSICAL EXAM:  VITAL SIGNS: ED Triage Vitals  Enc Vitals Group     BP 10/23/15 1521 159/101 mmHg     Pulse Rate 10/23/15 1521 54     Resp 10/23/15 1521 18     Temp 10/23/15 1521 97.6 F (36.4 C)     Temp Source 10/23/15 1521 Oral     SpO2 10/23/15 1521 91 %     Weight 10/23/15 1521 107 lb (48.535 kg)     Height 10/23/15 1521 5' (1.524 m)     Head Cir --      Peak Flow --      Pain Score --      Pain Loc --      Pain Edu? --      Excl. in GC? --     Constitutional:  Alert and oriented. I'll increase work of breathing but otherwise no acute distress.Marland Kitchen ENT   Head: Normocephalic and atraumatic.   Nose: No congestion/rhinnorhea.       Mouth: No erythema, no swelling   Cardiovascular: Normal rate, regular rhythm, no murmur noted Respiratory:  Mild increase work of breathing. Mild rales. No tachypnea.  Gastrointestinal: Soft and nontender. No distention.  Back: No muscle spasm, no tenderness, no CVA tenderness. Musculoskeletal: No deformity noted. Nontender with normal range of motion in all extremities.  No noted edema. Neurologic:  Communicative. Normal appearing spontaneous movement in all 4 extremities. No gross focal neurologic deficits are appreciated.  Skin:  Skin is warm, dry. No rash noted. Psychiatric: Mood and affect are normal. Speech and behavior  are normal.  ____________________________________________    LABS (pertinent positives/negatives)  Labs Reviewed  BASIC METABOLIC PANEL - Abnormal; Notable for the following:    Sodium 130 (*)    CO2 21 (*)    GFR calc non Af Amer 55 (*)    All other components within normal limits  CBC - Abnormal; Notable for the following:    RDW 15.1 (*)    All other components within normal limits  TROPONIN I  BRAIN NATRIURETIC PEPTIDE      ____________________________________________   EKG  ED ECG REPORT I, Paiten Boies W, the attending physician, personally viewed and interpreted this ECG.   Date: 10/23/2015  EKG Time: 1538  Rate: 53  Rhythm:Atrial fibrillation   Axis: Normal  Intervals: Normal  ST&T Change: None   ____________________________________________    RADIOLOGY  Chest x-ray: IMPRESSION: 1. Moderate enlargement of the cardiopericardial silhouette with Kerley B-lines and interstitial accentuation suggesting interstitial pulmonary edema. 2. Small right pleural effusion. 3. Atherosclerotic aorta.  ____________________________________________   PROCEDURES  CRITICAL CARE Performed by: Darien RamusKAMINSKI,Angeliah Wisdom W   Total critical care time: 30 minutes due to her hypoxia, need for Lasix, ACE inhibitor, and glycerin. Discussions with family as well as with the admitting team, Dr. Imogene Burnhen.  Critical care time was exclusive of separately billable procedures and treating other patients.  Critical care was necessary to treat or prevent imminent or life-threatening deterioration.  Critical care was time spent personally by me on the following activities: development of treatment plan with patient and/or surrogate as well as nursing, discussions with consultants, evaluation of patient's response to treatment, examination of patient, obtaining history from patient or surrogate, ordering and performing treatments and interventions, ordering and review of laboratory studies, ordering and review of radiographic studies, pulse oximetry and re-evaluation of patient's condition.   ____________________________________________   INITIAL IMPRESSION / ASSESSMENT AND PLAN / ED COURSE  Pertinent labs & imaging results that were available during my care of the patient were reviewed by me and considered in my medical decision making (see chart for details).  81 year old female with worsening for breath and hypoxia. She  is currently 91% on 4 L by nasal cannula.  ----------------------------------------- 5:23 PM on 10/23/2015 -----------------------------------------  Patient's chest x-ray shows curly B-lines and I am concerned that congestive heart is the primary cause of her hypoxia and shortness of breath. We will treat her with enalapril, nitroglycerin glycerin paced, and Lasix IV.    ____________________________________________   FINAL CLINICAL IMPRESSION(S) / ED DIAGNOSES  Final diagnoses:  Acute congestive heart failure, unspecified congestive heart failure type (HCC)  Shortness of breath  Hypoxia      Darien Ramusavid W Chabeli Barsamian, MD 10/23/15 1810

## 2015-10-23 NOTE — ED Notes (Signed)
Pt presents from [facility] with low oxygen and shortness of breath with a cough, all started on Friday. Pt currently 91% 4 liters.

## 2015-10-24 ENCOUNTER — Inpatient Hospital Stay
Admit: 2015-10-24 | Discharge: 2015-10-24 | Disposition: A | Discharge status: Home / Self Care | Payer: Medicare Other | Attending: Internal Medicine | Admitting: Internal Medicine

## 2015-10-24 DIAGNOSIS — L8990 Pressure ulcer of unspecified site, unspecified stage: Secondary | ICD-10-CM | POA: Insufficient documentation

## 2015-10-24 LAB — BASIC METABOLIC PANEL
ANION GAP: 6 (ref 5–15)
BUN: 20 mg/dL (ref 6–20)
CALCIUM: 8.9 mg/dL (ref 8.9–10.3)
CO2: 28 mmol/L (ref 22–32)
Chloride: 108 mmol/L (ref 101–111)
Creatinine, Ser: 1 mg/dL (ref 0.44–1.00)
GFR, EST AFRICAN AMERICAN: 57 mL/min — AB (ref >60)
GFR, EST NON AFRICAN AMERICAN: 49 mL/min — AB (ref >60)
GLUCOSE: 95 mg/dL (ref 65–99)
Potassium: 3.9 mmol/L (ref 3.5–5.1)
Sodium: 142 mmol/L (ref 135–145)

## 2015-10-24 LAB — CBC
HCT: 34.8 % — ABNORMAL LOW (ref 35.0–47.0)
HEMOGLOBIN: 11.7 g/dL — AB (ref 12.0–16.0)
MCH: 30.9 pg (ref 26.0–34.0)
MCHC: 33.7 g/dL (ref 32.0–36.0)
MCV: 91.8 fL (ref 80.0–100.0)
Platelets: 146 10*3/uL — ABNORMAL LOW (ref 150–440)
RBC: 3.79 MIL/uL — AB (ref 3.80–5.20)
RDW: 14.8 % — ABNORMAL HIGH (ref 11.5–14.5)
WBC: 5.8 10*3/uL (ref 3.6–11.0)

## 2015-10-24 LAB — TROPONIN I
TROPONIN I: 0.04 ng/mL — AB (ref <0.031)
TROPONIN I: 0.04 ng/mL — AB (ref <0.031)

## 2015-10-24 MED ORDER — SODIUM CHLORIDE 0.9 % IJ SOLN: 3.0000 mL | INTRAMUSCULAR | Status: DC | PRN

## 2015-10-24 MED ORDER — FUROSEMIDE 10 MG/ML IJ SOLN
20.0000 mg | Freq: Two times a day (BID) | INTRAMUSCULAR | Status: DC
  Administered 2015-10-25: 20 mg via INTRAVENOUS
  Filled 2015-10-24: qty 2

## 2015-10-24 MED ORDER — FLUTICASONE PROPIONATE 50 MCG/ACT NA SUSP
2.0000 | Freq: Every evening | NASAL | Status: DC | PRN
  Filled 2015-10-24: qty 16

## 2015-10-24 MED ORDER — ENSURE ENLIVE PO LIQD: 237.0000 mL | Freq: Every day | ORAL | Status: DC

## 2015-10-24 MED ORDER — TIZANIDINE HCL 4 MG PO TABS
2.0000 mg | ORAL_TABLET | Freq: Once | ORAL | Status: AC
  Administered 2015-10-24: 2 mg via ORAL
  Filled 2015-10-24: qty 1

## 2015-10-24 MED ORDER — ENOXAPARIN SODIUM 30 MG/0.3ML ~~LOC~~ SOLN
30.0000 mg | SUBCUTANEOUS | Status: DC
Start: 2015-10-24 — End: 2015-10-25
  Administered 2015-10-24: 30 mg via SUBCUTANEOUS
  Filled 2015-10-24: qty 0.3

## 2015-10-24 NOTE — Evaluation (Signed)
Physical Therapy Evaluation Patient Details Name: Danielle OppenheimGenelle T Good MRN: [id number] DOB: 05/20/1925 Today's Date: 10/24/2015   History of Present Illness  Pt is an 81 y.o. female presenting to hospital with SOB x3 days and admitted with acute CHF exacerbation.  PMH includes:  a-fib, PAD, osteoporosis, CHF, chronic L ankle ulcer, L femoral bypass.  Clinical Impression  Prior to admission, pt was modified independent with AD (uses SPC during day and rollator at night/first thing in morning).  Pt lives alone but her daughter lives across the street (and can assist as needed).  Currently pt is CGA with transfers and ambulation with RW.  Pt would benefit from skilled PT to address noted impairments and functional limitations.  Recommend pt discharge to home with intermittent assist from family (and HHPT pending pt's progress) when medically appropriate.     Follow Up Recommendations Supervision - Intermittent (HHPT pending pt's progress)    Equipment Recommendations       Recommendations for Other Services       Precautions / Restrictions Precautions Precautions: Fall Restrictions Weight Bearing Restrictions: No      Mobility  Bed Mobility Overal bed mobility: Needs Assistance Bed Mobility: Sit to Supine       Sit to supine: Supervision;HOB elevated      Transfers Overall transfer level: Needs assistance Equipment used: Rolling walker (2 wheeled) Transfers: Sit to/from Stand Sit to Stand: Min guard         General transfer comment: steady without loss of balance  Ambulation/Gait Ambulation/Gait assistance: Min guard Ambulation Distance (Feet): 100 Feet Assistive device: Rolling walker (2 wheeled)       General Gait Details: decreased B step length/foot clearance/heelstrike; mildly antalgic d/t cramping in B calves; decreased cadence but steady  Stairs            Wheelchair Mobility    Modified Rankin (Stroke Patients Only)       Balance Overall  balance assessment: Needs assistance Sitting-balance support: No upper extremity supported;Feet supported Sitting balance-Leahy Scale: Good     Standing balance support: Bilateral upper extremity supported (on RW) Standing balance-Leahy Scale: Good                               Pertinent Vitals/Pain Pain Assessment:  (pt c/o cramping in B calves but alleviated after ambulation)  See flowsheet for HR and O2 vitals.    Home Living Family/patient expects to be discharged to:: Private residence Living Arrangements: Alone Available Help at Discharge: Family Type of Home: House Home Access: Level entry     Home Layout: One level Home Equipment: Environmental consultantWalker - 2 wheels;Walker - 4 wheels;Cane - single point;Bedside commode;Shower seat - built in      Prior Function Level of Independence: Independent with assistive device(s)         Comments: Pt uses SPC during the day and rollator at night and 1st thing in the morning.  Pt's daughter lives across the street and can assist as needed.  Pt denies any falls in past 6 months.     Hand Dominance        Extremity/Trunk Assessment   Upper Extremity Assessment: Generalized weakness           Lower Extremity Assessment: Generalized weakness         Communication   Communication: No difficulties  Cognition Arousal/Alertness: Awake/alert Behavior During Therapy: WFL for tasks assessed/performed Overall Cognitive Status: Within Functional Limits  for tasks assessed                      General Comments   Nursing cleared pt for participation in physical therapy.  Pt agreeable to PT session. Pt's daughter present during session.    Exercises  Performed standing B heelcord stretch 2x30 seconds B (UE support on RW).  Educated pt on pacing/energy conservation techniques.      Assessment/Plan    PT Assessment Patient needs continued PT services  PT Diagnosis Generalized weakness;Difficulty walking   PT  Problem List Decreased activity tolerance;Decreased balance;Decreased mobility;Cardiopulmonary status limiting activity  PT Treatment Interventions DME instruction;Gait training;Functional mobility training;Therapeutic activities;Therapeutic exercise;Balance training;Neuromuscular re-education;Patient/family education   PT Goals (Current goals can be found in the Care Plan section) Acute Rehab PT Goals Patient Stated Goal: to go home PT Goal Formulation: With patient/family Time For Goal Achievement: 11/07/15 Potential to Achieve Goals: Good    Frequency Min 2X/week   Barriers to discharge        Co-evaluation               End of Session Equipment Utilized During Treatment: Gait belt;Oxygen Activity Tolerance: Patient tolerated treatment well Patient left: in bed;with call bell/phone within reach;with bed alarm set;with family/visitor present Nurse Communication: Mobility status         Time: 1400-1430 PT Time Calculation (min) (ACUTE ONLY): 30 min   Charges:   PT Evaluation $Initial PT Evaluation Tier I: 1 Procedure PT Treatments $Therapeutic Exercise: 8-22 mins   PT G CodesHendricks Limes 11-14-2015, 2:58 PM Hendricks Limes, PT [phone]

## 2015-10-24 NOTE — Consult Note (Signed)
[facility] Cardiology  CARDIOLOGY CONSULT NOTE  Patient ID: [name] MRN: [id number] DOB/AGE: 79-Sep-1926 79 y.o.  Admit date: 10/23/2015 Referring Physician Clent Ridges Primary Physician [facility] Primary Cardiologist [facility] Reason for Consultation congestive heart failure  HPI: 81 year old female referred for evaluation of acute on chronic diastolic congestive heart failure. Patient has a history of essential hypertension and chronic CHF. She has a history of chronic atrial fibrillation, currently on Plavix history of peripheral vascular disease. According to the patient, his been in usual state of health until recently she noted sinus upper airway congestion consistent with URI type symptoms. He presented to [facility] emergency room chest x-ray revealed evidence for pulmonary edema patient was admitted with acute on chronic*congestive heart failure. Initial labs were notable for borderline elevated troponin. The patient was treated with intravenous furosemide with diuresis and overall clinical improvement. Echocardiogram was performed which revealed normal ventricular function with LVEF of 55-65%. Moderate to severe tricuspid regurgitation was noted.  Review of systems complete and found to be negative unless listed above     Past Medical History  Diagnosis Date  . Atrial fibrillation (HCC)   . PAD (peripheral artery disease) (HCC)     s/p bypass and left leg stents  . Osteoporosis   . Peripheral neuropathy (HCC)   . Degenerative joint disease   . Degenerative arthritis   . Foot ulcer (HCC)     Chronic left ankle ulcer  . GERD (gastroesophageal reflux disease)     Past Surgical History  Procedure Laterality Date  . Femoral bypass surgery      left leg  . Stent placement iliac ([facility] hx)      Left leg  . Abdominal hysterectomy      Prescriptions prior to admission  Medication Sig Dispense Refill Last Dose  . ALPRAZolam (XANAX) 0.25 MG tablet Take 0.25 mg by mouth at bedtime as needed for  sleep.   PRN at PRN  . cetirizine-pseudoephedrine (ZYRTEC-D) 5-120 MG tablet Take 1 tablet by mouth 2 (two) times daily as needed for allergies.   Past Week at Unknown time  . clopidogrel (PLAVIX) 75 MG tablet Take 75 mg by mouth daily.   10/23/2015 at 1000  . digoxin (LANOXIN) 0.125 MG tablet Take 0.125 mg by mouth daily.   10/23/2015 at Unknown time  . docusate sodium (COLACE) 100 MG capsule Take 100 mg by mouth 2 (two) times daily as needed for mild constipation.   PRN at PRN  . fluticasone (FLONASE) 50 MCG/ACT nasal spray Place 2 sprays into both nostrils at bedtime as needed for rhinitis.   PRN at PRN  . furosemide (LASIX) 20 MG tablet Take 20 mg by mouth daily.   10/23/2015 at Unknown time  . gabapentin (NEURONTIN) 300 MG capsule Take 300 mg by mouth 2 (two) times daily.    10/23/2015 at Unknown time  . HYDROcodone-acetaminophen (NORCO/VICODIN) 5-325 MG tablet Take 1 tablet by mouth 2 (two) times daily as needed for moderate pain.   Past Week at Unknown time  . losartan (COZAAR) 50 MG tablet Take 50 mg by mouth daily.   10/23/2015 at Unknown time  . metoprolol (LOPRESSOR) 50 MG tablet Take 50 mg by mouth 2 (two) times daily.   10/23/2015 at 1000  . phenylephrine (SUDAFED PE) 10 MG TABS tablet Take 10 mg by mouth every 4 (four) hours as needed (for congestion).   Past Week at Unknown time  . ranitidine (ZANTAC) 150 MG tablet Take 150 mg by mouth 2 (two)  times daily as needed for heartburn.   PRN at PRN  . rOPINIRole (REQUIP) 2 MG tablet Take 2 mg by mouth at bedtime.   10/22/2015 at Unknown time  . simvastatin (ZOCOR) 10 MG tablet Take 10 mg by mouth at bedtime.   10/22/2015 at Unknown time  . Vitamin D, Ergocalciferol, (DRISDOL) 50000 UNITS CAPS capsule Take 50,000 Units by mouth every 7 (seven) days. Pt takes on Tuesday.   10/17/2015 at unknown   Social History   Social History  . Marital Status: Widowed    Spouse Name: N/A  . Number of Children: N/A  . Years of Education: N/A    Occupational History  . Not on file.   Social History Main Topics  . Smoking status: Former Games developer  . Smokeless tobacco: Not on file     Comment: Quit 10 years ago  . Alcohol Use: No  . Drug Use: Not on file  . Sexual Activity: Not on file   Other Topics Concern  . Not on file   Social History Narrative   Left-sided home independently. Walks with a cane, also has a walker. Daughter lives right across her.    Family History  Problem Relation Age of Onset  . Peripheral vascular disease Father       Review of systems complete and found to be negative unless listed above      PHYSICAL EXAM  General: Well developed, well nourished, in no acute distress HEENT:  Normocephalic and atramatic Neck:  No JVD.  Lungs: Clear bilaterally to auscultation and percussion. Heart: HRRR . Normal S1 and S2 without gallops or murmurs.  Abdomen: Bowel sounds are positive, abdomen soft and non-tender  Msk:  Back normal, normal gait. Normal strength and tone for age. Extremities: No clubbing, cyanosis or edema.   Neuro: Alert and oriented X 3. Psych:  Good affect, responds appropriately  Labs:   Lab Results  Component Value Date   WBC 5.8 10/24/2015   HGB 11.7* 10/24/2015   HCT 34.8* 10/24/2015   MCV 91.8 10/24/2015   PLT 146* 10/24/2015    Recent Labs Lab 10/24/15 0437  NA 142  K 3.9  CL 108  CO2 28  BUN 20  CREATININE 1.00  CALCIUM 8.9  GLUCOSE 95   Lab Results  Component Value Date   TROPONINI 0.04* 10/24/2015   No results found for: CHOL No results found for: HDL No results found for: LDLCALC No results found for: TRIG No results found for: CHOLHDL No results found for: LDLDIRECT    Radiology: Dg Chest 2 View  10/23/2015  CLINICAL DATA:  Hypoxia. Shortness of breath. Cough. Symptoms started on Friday. EXAM: CHEST  2 VIEW COMPARISON:  Port from 12/22/2014 FINDINGS: Moderate enlargement of the cardiopericardial silhouette. Tortuous and atherosclerotic thoracic  aorta. Small right pleural effusion. Indistinct pulmonary vasculature with accentuated interstitium especially in the lung bases. Suspected Kerley B-lines. Expanded AP diameter of the chest suggesting COPD. Thoracic spondylosis. Thoracic kyphosis. Dextroconvex upper thoracic scoliosis. Right greater than left apical pleural parenchymal scarring. IMPRESSION: 1. Moderate enlargement of the cardiopericardial silhouette with Kerley B-lines and interstitial accentuation suggesting interstitial pulmonary edema. 2. Small right pleural effusion. 3. Atherosclerotic aorta. Electronically Signed   By: Gaylyn Rong M.D.   On: 10/23/2015 16:14    EKG: Atrial fibrillation with a controlled rate  ASSESSMENT AND PLAN:   1. Acute on chronic diastolic congestive heart failure, improved after initial diuresis 2. URI 3. Moderate to severe tricuspid regurgitation, chronic  4. Chronic atrial fibrillation, rate control  Recommendations  1. Agree with overall current therapy 2. Continue diuresis 3. Carefully monitor renal status 4. Defer chronic anticoagulation for atrial fibrillation 5. If patient continues to improve clinically, may discharge in a.m., follow-up Dr. Juliann Paresallwood   Signed: Marcina MillardPARASCHOS,Rennae Ferraiolo MD,PhD, [facility] 10/24/2015, 5:19 PM

## 2015-10-24 NOTE — Care Management (Signed)
Patient presents from home with CHF exacerbation. Patient lives at home alone.  Patient's daughter lives across the street and is a support system for her.  Patient has a cane and rollator at home.  Patient primarily uses cane for ambulation.  Patient does not have chronic O2.  Patient is currently requiring 4 liters acutely .  If patient requires O2 at the time of discharge she will need qualifying O2 sats.  Currently PT is recommending Supervision - Intermittent (HHPT pending pt's progress). RNCM to follow for discharge planning

## 2015-10-24 NOTE — Progress Notes (Signed)
*  PRELIMINARY RESULTS* Echocardiogram 2D Echocardiogram has been performed.  [name] 10/24/2015, 8:55 AM

## 2015-10-24 NOTE — Progress Notes (Addendum)
Initial Nutrition Assessment   INTERVENTION:   Meals and Snacks: Cater to patient preferences. Pt aware of low sodium diet order. Medical Food Supplement Therapy: will recommend Ensure Enlive po daily, each supplement provides 350 kcal and 20 grams of protein; will send mixed with ice cream as a milkshake as pt prefers Ensure to be frozen. Coordination of Care: Recommend daily weights   NUTRITION DIAGNOSIS:   Increased nutrient needs related to wound healing as evidenced by estimated needs.  GOAL:   Patient will meet greater than or equal to 90% of their needs  MONITOR:    (Energy Intake, Anthropometrics, Digestive System)  REASON FOR ASSESSMENT:   Diagnosis    ASSESSMENT:   Pt admitted with SOB secondary to CHF.  Past Medical History  Diagnosis Date  . Atrial fibrillation (HCC)   . PAD (peripheral artery disease) (HCC)     s/p bypass and left leg stents  . Osteoporosis   . Peripheral neuropathy (HCC)   . Degenerative joint disease   . Degenerative arthritis   . Foot ulcer (HCC)     Chronic left ankle ulcer  . GERD (gastroesophageal reflux disease)      Diet Order:  Diet 2 gram sodium Room service appropriate?: Yes; Fluid consistency:: Thin    Current Nutrition: Pt reports eating pasta, salad and fruit for lunch today and home fries with pancakes for breakfast. Pt reports very good appetite today.   Food/Nutrition-Related History: Pt reports appetite was good PTA usually eats breakfast late, a bowl of cereal, 'whatever' is around for lunch and dinner is always a vegetable and meat. Pt reports liking to drink Boost as a bedtime snack.    Scheduled Medications:  . aspirin  81 mg Oral Daily  . clopidogrel  75 mg Oral Daily  . digoxin  0.125 mg Oral Daily  . docusate sodium  100 mg Oral BID  . enoxaparin (LOVENOX) injection  30 mg Subcutaneous Q24H  . famotidine  20 mg Oral BID  . feeding supplement (ENSURE ENLIVE)  237 mL Oral Q2000  . furosemide  20 mg  Intravenous 3 times per day  . gabapentin  300 mg Oral BID  . metoprolol  50 mg Oral BID  . rOPINIRole  2 mg Oral QHS  . simvastatin  10 mg Oral QHS  . sodium chloride  3 mL Intravenous Q12H  . Vitamin D (Ergocalciferol)  50,000 Units Oral Q7 days    Electrolyte/Renal Profile and Glucose Profile:   Recent Labs Lab 10/23/15 1528 10/24/15 0437  NA 130* 142  K 4.2 3.9  CL 101 108  CO2 21* 28  BUN 16 20  CREATININE 0.91 1.00  CALCIUM 9.0 8.9  GLUCOSE 77 95   Protein Profile: No results for input(s): ALBUMIN in the last 168 hours.  Gastrointestinal Profile: Last BM:   Nutrition-Focused Physical Exam Findings:  Unable to complete Nutrition-Focused physical exam at this time.    Weight Change: Pt reports weight has been between 100-105lbs recently   Skin:   (Stage II foot pressure ulcer)  Height:   Ht Readings from Last 1 Encounters:  10/23/15 5' (1.524 m)    Weight:   Wt Readings from Last 1 Encounters:  10/23/15 107 lb (48.535 kg)    BMI:  Body mass index is 20.9 kg/(m^2).  Estimated Nutritional Needs:   Kcal:  BEE: 839kcals, TEE: (IF 1.1-1.3)(AF 1.2) 1104-1305kcals  Protein:  58-72g protein (1.2-1.5g/kg)  Fluid:  1213-143155mL of fluid (25-7530mL/kg)    MODERATE  Care Level  [place], [place], [place] Pager [phone]

## 2015-10-24 NOTE — Progress Notes (Signed)
[facility] [place] [facility]   PATIENT NAME: Danielle CocoGenelle Good    MR#:  [id number]  DATE OF BIRTH:  12/24/1924  SUBJECTIVE:  CHIEF COMPLAINT:   Chief Complaint  Patient presents with  . Shortness of Breath   Comfortable. Still with fairly high oxygen need  REVIEW OF SYSTEMS:   Review of Systems  Constitutional: Negative for fever.  Respiratory: Positive for shortness of breath.   Cardiovascular: Negative for chest pain and palpitations.  Gastrointestinal: Negative for nausea, vomiting and abdominal pain.  Genitourinary: Negative for dysuria.    DRUG ALLERGIES:   Allergies  Allergen Reactions  . Alendronate Other (See Comments)    Reaction:  Unknown   . Doxycycline Nausea And Vomiting  . Minocycline Nausea And Vomiting  . Oxycodone Other (See Comments)    Reaction:  Hallucinations   . Sulfa Antibiotics Nausea And Vomiting    VITALS:  Blood pressure 98/58, pulse 84, temperature 97.4 F (36.3 C), temperature source Axillary, resp. rate 18, height 5' (1.524 m), weight 48.535 kg (107 lb), SpO2 94 %.  PHYSICAL EXAMINATION:  GENERAL:  81 y.o.-year-old patient lying in the bed with no acute distress.  LUNGS: Basilar crackles, no wheezing, rales,rhonchi or crepitation. No use of accessory muscles of respiration.  CARDIOVASCULAR: S1, S2 normal. No murmurs, rubs, or gallops.  ABDOMEN: Soft, nontender, nondistended. Bowel sounds present. No organomegaly or mass.  EXTREMITIES: Trace bilateral pedal edema, cyanosis, or clubbing.  NEUROLOGIC: Cranial nerves II through XII are intact. Muscle strength 5/5 in all extremities. Sensation intact. Gait not checked.  PSYCHIATRIC: The patient is alert and oriented x 3.  SKIN: No obvious rash, lesion, or ulcer.    LABORATORY PANEL:   CBC  Recent Labs Lab 10/24/15 0437  WBC 5.8  HGB 11.7*  HCT 34.8*  PLT 146*    ------------------------------------------------------------------------------------------------------------------  Chemistries   Recent Labs Lab 10/24/15 0437  NA 142  K 3.9  CL 108  CO2 28  GLUCOSE 95  BUN 20  CREATININE 1.00  CALCIUM 8.9   ------------------------------------------------------------------------------------------------------------------  Cardiac Enzymes  Recent Labs Lab 10/24/15 1011  TROPONINI 0.04*   ------------------------------------------------------------------------------------------------------------------  RADIOLOGY:  Dg Chest 2 View  10/23/2015  CLINICAL DATA:  Hypoxia. Shortness of breath. Cough. Symptoms started on Friday. EXAM: CHEST  2 VIEW COMPARISON:  Port from 12/22/2014 FINDINGS: Moderate enlargement of the cardiopericardial silhouette. Tortuous and atherosclerotic thoracic aorta. Small right pleural effusion. Indistinct pulmonary vasculature with accentuated interstitium especially in the lung bases. Suspected Kerley B-lines. Expanded AP diameter of the chest suggesting COPD. Thoracic spondylosis. Thoracic kyphosis. Dextroconvex upper thoracic scoliosis. Right greater than left apical pleural parenchymal scarring. IMPRESSION: 1. Moderate enlargement of the cardiopericardial silhouette with Kerley B-lines and interstitial accentuation suggesting interstitial pulmonary edema. 2. Small right pleural effusion. 3. Atherosclerotic aorta. Electronically Signed   By: Gaylyn RongWalter  Liebkemann M.D.   On: 10/23/2015 16:14    EKG:   Orders placed or performed during the hospital encounter of 10/23/15  . ED EKG within 10 minutes  . ED EKG within 10 minutes  . EKG 12-Lead  . EKG 12-Lead    ASSESSMENT AND PLAN:   1. Acute respiratory failure with hypoxia due to acute on chronic diastolic heart failure:  - Appreciate cardiology consultation, echo showing EF 50-65%, diastolic dysfunction, tricuspid regurgitation and severe - Continue with diuresis,  she is responding well, we will decrease frequency  2. Atrial fibrillation - Rate is controlled. Continue metoprolol, digoxin  3. Peripheral vascular disease:  Continue Plavix  4. Hypertension: Blood pressure is fairly low with diuresis. I am holding losartan. Continue metoprolol    All the records are reviewed and case discussed with Care Management/Social Workerr. Management plans discussed with the patient, family and they are in agreement.  CODE STATUS: Full  TOTAL TIME TAKING CARE OF THIS PATIENT:25 minutes.  Greater than 50% of time spent in care coordination and counseling. POSSIBLE D/C IN 1 DAYS, DEPENDING ON CLINICAL CONDITION.   Elby Showers M.D on 10/24/2015 at 6:01 PM  Between 7am to 6pm - Pager - [phone]  After 6pm go to [email or web address] - password [email or web address] [facility]  [place] [facility]  Office  [phone]  CC: Primary care physician; Barbette Reichmann, MD

## 2015-10-24 NOTE — Progress Notes (Signed)
Patient alert and oriented, BP trending low, HTN meds and lasix were held today.    Patient c/o cramps in her legs which is better with ambulation and walking.  No other complaints.  Currently on 3LNC, no shortness of breath noted.

## 2015-10-25 ENCOUNTER — Inpatient Hospital Stay: Payer: Medicare Other

## 2015-10-25 LAB — CBC
HCT: 37.6 % (ref 35.0–47.0)
Hemoglobin: 12.7 g/dL (ref 12.0–16.0)
MCH: 31.1 pg (ref 26.0–34.0)
MCHC: 33.8 g/dL (ref 32.0–36.0)
MCV: 92 fL (ref 80.0–100.0)
Platelets: 167 10*3/uL (ref 150–440)
RBC: 4.09 MIL/uL (ref 3.80–5.20)
RDW: 15 % — AB (ref 11.5–14.5)
WBC: 5.1 10*3/uL (ref 3.6–11.0)

## 2015-10-25 LAB — BASIC METABOLIC PANEL
Anion gap: 6 (ref 5–15)
BUN: 19 mg/dL (ref 6–20)
CALCIUM: 8.8 mg/dL — AB (ref 8.9–10.3)
CO2: 27 mmol/L (ref 22–32)
CREATININE: 1.16 mg/dL — AB (ref 0.44–1.00)
Chloride: 106 mmol/L (ref 101–111)
GFR calc non Af Amer: 41 mL/min — ABNORMAL LOW (ref >60)
GFR, EST AFRICAN AMERICAN: 47 mL/min — AB (ref >60)
Glucose, Bld: 88 mg/dL (ref 65–99)
Potassium: 4 mmol/L (ref 3.5–5.1)
SODIUM: 139 mmol/L (ref 135–145)

## 2015-10-25 MED ORDER — FUROSEMIDE 20 MG PO TABS: 20.0000 mg | ORAL_TABLET | Freq: Every day | ORAL | Status: DC

## 2015-10-25 NOTE — Progress Notes (Signed)
[facility]  SUBJECTIVE: I feel better   Filed Vitals:   10/25/15 1119 10/25/15 1120 10/25/15 1123 10/25/15 1203  BP: 129/59   132/60  Pulse: 64 70  55  Temp: 98.1 F (36.7 C)   98.3 F (36.8 C)  TempSrc: Oral   Oral  Resp:    20  Height:      Weight:      SpO2: 99%  97% 95%     Intake/Output Summary (Last 24 hours) at 10/25/15 1336 Last data filed at 10/25/15 1212  Gross per 24 hour  Intake    480 ml  Output   2325 ml  Net  -1845 ml      PHYSICAL EXAM  General: Well developed, well nourished, in no acute distress HEENT:  Normocephalic and atramatic Neck:  No JVD.  Lungs: Clear bilaterally to auscultation and percussion. Heart: HRRR . Normal S1 and S2 without gallops or murmurs.  Abdomen: Bowel sounds are positive, abdomen soft and non-tender  Msk:  Back normal, normal gait. Normal strength and tone for age. Extremities: No clubbing, cyanosis or edema.   Neuro: Alert and oriented X 3. Psych:  Good affect, responds appropriately   LABS: Basic Metabolic Panel:  Recent Labs  86/57/8411/22/16 0437 10/25/15 0413  NA 142 139  K 3.9 4.0  CL 108 106  CO2 28 27  GLUCOSE 95 88  BUN 20 19  CREATININE 1.00 1.16*  CALCIUM 8.9 8.8*   Liver Function Tests: No results for input(s): AST, ALT, ALKPHOS, BILITOT, PROT, ALBUMIN in the last 72 hours. No results for input(s): LIPASE, AMYLASE in the last 72 hours. CBC:  Recent Labs  10/24/15 0437 10/25/15 0413  WBC 5.8 5.1  HGB 11.7* 12.7  HCT 34.8* 37.6  MCV 91.8 92.0  PLT 146* 167   Cardiac Enzymes:  Recent Labs  10/23/15 2227 10/24/15 0437 10/24/15 1011  TROPONINI 0.03 0.04* 0.04*   BNP: Invalid input(s): POCBNP D-Dimer: No results for input(s): DDIMER in the last 72 hours. Hemoglobin A1C: No results for input(s): HGBA1C in the last 72 hours. Fasting Lipid Panel: No results for input(s): CHOL, HDL, LDLCALC, TRIG, CHOLHDL, LDLDIRECT in the last 72 hours. Thyroid Function Tests: No results for input(s):  TSH, T4TOTAL, T3FREE, THYROIDAB in the last 72 hours.  Invalid input(s): FREET3 Anemia Panel: No results for input(s): VITAMINB12, FOLATE, FERRITIN, TIBC, IRON, RETICCTPCT in the last 72 hours.  Dg Chest 2 View  10/25/2015  CLINICAL DATA:  Cough, shortness of Breath EXAM: CHEST  2 VIEW COMPARISON:  10/23/2015 FINDINGS: Cardiomediastinal silhouette is stable. No convincing pulmonary edema. There is small right pleural effusion with right basilar atelectasis or infiltrate. Trace left basilar atelectasis. Osteopenia and degenerative changes thoracic spine. IMPRESSION: Small right pleural effusion with right basilar atelectasis or infiltrate. Trace left basilar atelectasis. No convincing pulmonary edema. Osteopenia and degenerative changes thoracic spine. Electronically Signed   By: Natasha MeadLiviu  Pop M.D.   On: 10/25/2015 10:52   Dg Chest 2 View  10/23/2015  CLINICAL DATA:  Hypoxia. Shortness of breath. Cough. Symptoms started on Friday. EXAM: CHEST  2 VIEW COMPARISON:  Port from 12/22/2014 FINDINGS: Moderate enlargement of the cardiopericardial silhouette. Tortuous and atherosclerotic thoracic aorta. Small right pleural effusion. Indistinct pulmonary vasculature with accentuated interstitium especially in the lung bases. Suspected Kerley B-lines. Expanded AP diameter of the chest suggesting COPD. Thoracic spondylosis. Thoracic kyphosis. Dextroconvex upper thoracic scoliosis. Right greater than left apical pleural parenchymal scarring. IMPRESSION: 1. Moderate enlargement of the cardiopericardial silhouette  with Kerley B-lines and interstitial accentuation suggesting interstitial pulmonary edema. 2. Small right pleural effusion. 3. Atherosclerotic aorta. Electronically Signed   By: Gaylyn Rong M.D.   On: 10/23/2015 16:14     Echo Normal left ventricular function, LVEF 55-65%, with moderate to severe tricuspid regurgitation  TELEMETRY: Sinus rhythm:  ASSESSMENT AND PLAN:  Active Problems:   CHF  (congestive heart failure) (HCC)   Pressure ulcer    1. Acute on chronic diastolic congestive heart failure, improved after diuresis 2. Chronic atrial fibrillation, rate control 3. Moderate to severe tricuspid regurgitation, chronic  Recommendations  1. Agree with current therapy 2. Continue gentle diuresis 3. Carefully monitor renal status 4. Defer chronic anticoagulation for atrial fibrillation  Sign off for now, please call if any questions   Olden Klauer, MD, PhD, [facility] 10/25/2015 1:36 PM

## 2015-10-25 NOTE — Care Management Important Message (Signed)
Important Message  Patient Details  Name: Georgeann OppenheimGenelle T Espericueta MRN: [id number] Date of Birth: 07/25/1925   Medicare Important Message Given:  Yes    Olegario MessierKathy A Jadee Golebiewski 10/25/2015, 10:28 AM

## 2015-10-25 NOTE — Progress Notes (Signed)
Initial appointment scheduled at the [facility] on November 06, 2015 at 10:00am. Thank you .

## 2015-10-25 NOTE — Discharge Instructions (Signed)
Heart Failure  Heart failure means your heart has trouble pumping blood. This makes it hard for your body to work well. Heart failure is usually a long-term (chronic) condition. You must take good care of yourself and follow your doctor's treatment plan.  HOME CARE   Take your heart medicine as told by your doctor.    Do not stop taking medicine unless your doctor tells you to.    Do not skip any dose of medicine.    Refill your medicines before they run out.    Take other medicines only as told by your doctor or pharmacist.   Stay active if told by your doctor. The elderly and people with severe heart failure should talk with a doctor about physical activity.   Eat heart-healthy foods. Choose foods that are without trans fat and are low in saturated fat, cholesterol, and salt (sodium). This includes fresh or frozen fruits and vegetables, fish, lean meats, fat-free or low-fat dairy foods, whole grains, and high-fiber foods. Lentils and dried peas and beans (legumes) are also good choices.   Limit salt if told by your doctor.   Cook in a healthy way. Roast, grill, broil, bake, poach, steam, or stir-fry foods.   Limit fluids as told by your doctor.   Weigh yourself every morning. Do this after you pee (urinate) and before you eat breakfast. Write down your weight to give to your doctor.   Take your blood pressure and write it down if your doctor tells you to.   Ask your doctor how to check your pulse. Check your pulse as told.   Lose weight if told by your doctor.   Stop smoking or chewing tobacco. Do not use gum or patches that help you quit without your doctor's approval.   Schedule and go to doctor visits as told.   Nonpregnant women should have no more than 1 drink a day. Men should have no more than 2 drinks a day. Talk to your doctor about drinking alcohol.   Stop illegal drug use.   Stay current with shots (immunizations).   Manage your health conditions as told by your doctor.   Learn to  manage your stress.   Rest when you are tired.   If it is really hot outside:    Avoid intense activities.    Use air conditioning or fans, or get in a cooler place.    Avoid caffeine and alcohol.    Wear loose-fitting, lightweight, and light-colored clothing.   If it is really cold outside:    Avoid intense activities.    Layer your clothing.    Wear mittens or gloves, a hat, and a scarf when going outside.    Avoid alcohol.   Learn about heart failure and get support as needed.   Get help to maintain or improve your quality of life and your ability to care for yourself as needed.  GET HELP IF:    You gain weight quickly.   You are more short of breath than usual.   You cannot do your normal activities.   You tire easily.   You cough more than normal, especially with activity.   You have any or more puffiness (swelling) in areas such as your hands, feet, ankles, or belly (abdomen).   You cannot sleep because it is hard to breathe.   You feel like your heart is beating fast (palpitations).   You get dizzy or light-headed when you stand up.  GET HELP   are not doing well or get worse.   This information is not intended to replace advice given to you by your health care provider. Make sure you discuss any questions you have with your health care provider.   Document Released: 08/27/2008 Document Revised: 12/09/2014 Document Reviewed: 01/04/2013 Elsevier Interactive Patient Education 2016 [employer]   [facility] Failure [facility] appointment on November 06, 2015 at 10:00am with Clarisa Kindredina Hackney, FNP. Please call [phone] to reschedule.

## 2015-10-25 NOTE — Progress Notes (Signed)
PT Cancellation Note  Patient Details Name: [name] MRN: [id number] DOB: 10/22/1925   Cancelled Treatment:    Reason Eval/Treat Not Completed: Other (comment). Treatment attempted, MD in the room with pt/family. Spoke with MD when exited room who states it is not necessary to see this pt. Pt is being discharged today and pt/family wish no follow up PT at this time.    Elsie StainHeidi Elizabeth Bishop 10/25/2015, 3:41 PM

## 2015-10-28 NOTE — Discharge Summary (Signed)
[facility] [place] [facility]   PATIENT NAME: Danielle CocoGenelle Good    MR#:  [id number]  DATE OF BIRTH:  04/01/1925  DATE OF ADMISSION:  10/23/2015 ADMITTING PHYSICIAN: Enid Baasadhika Kalisetti, MD  DATE OF DISCHARGE: 10/25/2015  4:48 PM  PRIMARY CARE PHYSICIAN: Barbette ReichmannHANDE,VISHWANATH, MD    ADMISSION DIAGNOSIS:  Shortness of breath [R06.02] CHF (congestive heart failure) (HCC) [I50.9] Hypoxia [R09.02] Acute congestive heart failure, unspecified congestive heart failure type (HCC) [I50.9]  DISCHARGE DIAGNOSIS:  Active Problems:   CHF (congestive heart failure) (HCC)   Pressure ulcer Acute respiratory failure with hypoxia due to acute on chronic diastolic heart failure SECONDARY DIAGNOSIS:   Past Medical History  Diagnosis Date  . Atrial fibrillation (HCC)   . PAD (peripheral artery disease) (HCC)     s/p bypass and left leg stents  . Osteoporosis   . Peripheral neuropathy (HCC)   . Degenerative joint disease   . Degenerative arthritis   . Foot ulcer (HCC)     Chronic left ankle ulcer  . GERD (gastroesophageal reflux disease)     HOSPITAL COURSE:  81 y.o. female with a known history of atrial fibrillation, recently taken off of anticoagulation, peripheral arterial disease, osteoporosis and arthritis presents to the hospital secondary to difficulty breathing. Chest x-ray revealed pulmonary edema was admitted for CHF exacerbation.  Please see Dr. Prudencio PairKalisetti's dictated history and physical for further details.   Patient was admitted for Acute respiratory failure with hypoxia due to acute on chronic diastolic heart failure and responded well to intravenous diuresis.  Cardiology consultation was obtained who agreed with above management.  Patient was feeling much better by 23rd of November and was discharged home in stable condition.  She was agreeable to discharge plans.  DISCHARGE CONDITIONS:   stable  CONSULTS OBTAINED:  Treatment Team:  Marcina MillardAlexander  Paraschos, MD  DRUG ALLERGIES:   Allergies  Allergen Reactions  . Alendronate Other (See Comments)    Reaction:  Unknown   . Doxycycline Nausea And Vomiting  . Minocycline Nausea And Vomiting  . Oxycodone Other (See Comments)    Reaction:  Hallucinations   . Sulfa Antibiotics Nausea And Vomiting    DISCHARGE MEDICATIONS:   Discharge Medication List as of 10/25/2015  4:11 PM    CONTINUE these medications which have CHANGED   Details  furosemide (LASIX) 20 MG tablet Take 1 tablet (20 mg total) by mouth daily. Can use additional 20 mg PO lasix as need for worsening SOB, LEG SWELLING or more than 3 lbs wt gain in a week, Starting 10/25/2015, Until Discontinued, Normal      CONTINUE these medications which have NOT CHANGED   Details  ALPRAZolam (XANAX) 0.25 MG tablet Take 0.25 mg by mouth at bedtime as needed for sleep., Until Discontinued, Historical Med    cetirizine-pseudoephedrine (ZYRTEC-D) 5-120 MG tablet Take 1 tablet by mouth 2 (two) times daily as needed for allergies., Until Discontinued, Historical Med    clopidogrel (PLAVIX) 75 MG tablet Take 75 mg by mouth daily., Until Discontinued, Historical Med    digoxin (LANOXIN) 0.125 MG tablet Take 0.125 mg by mouth daily., Until Discontinued, Historical Med    docusate sodium (COLACE) 100 MG capsule Take 100 mg by mouth 2 (two) times daily as needed for mild constipation., Until Discontinued, Historical Med    fluticasone (FLONASE) 50 MCG/ACT nasal spray Place 2 sprays into both nostrils at bedtime as needed for rhinitis., Until Discontinued, Historical Med    gabapentin (NEURONTIN) 300 MG  capsule Take 300 mg by mouth 2 (two) times daily. , Until Discontinued, Historical Med    HYDROcodone-acetaminophen (NORCO/VICODIN) 5-325 MG tablet Take 1 tablet by mouth 2 (two) times daily as needed for moderate pain., Until Discontinued, Historical Med    losartan (COZAAR) 50 MG tablet Take 50 mg by mouth daily., Until Discontinued,  Historical Med    metoprolol (LOPRESSOR) 50 MG tablet Take 50 mg by mouth 2 (two) times daily., Until Discontinued, Historical Med    phenylephrine (SUDAFED PE) 10 MG TABS tablet Take 10 mg by mouth every 4 (four) hours as needed (for congestion)., Until Discontinued, Historical Med    ranitidine (ZANTAC) 150 MG tablet Take 150 mg by mouth 2 (two) times daily as needed for heartburn., Until Discontinued, Historical Med    rOPINIRole (REQUIP) 2 MG tablet Take 2 mg by mouth at bedtime., Until Discontinued, Historical Med    simvastatin (ZOCOR) 10 MG tablet Take 10 mg by mouth at bedtime., Until Discontinued, Historical Med    Vitamin D, Ergocalciferol, (DRISDOL) 50000 UNITS CAPS capsule Take 50,000 Units by mouth every 7 (seven) days. Pt takes on Tuesday., Until Discontinued, Historical Med       DISCHARGE INSTRUCTIONS:    DIET:  Cardiac diet  DISCHARGE CONDITION:  Good  ACTIVITY:  Activity as tolerated  OXYGEN:  Home Oxygen: No.   Oxygen Delivery: room air  DISCHARGE LOCATION:  home   If you experience worsening of your admission symptoms, develop shortness of breath, life threatening emergency, suicidal or homicidal thoughts you must seek medical attention immediately by calling 911 or calling your MD immediately  if symptoms less severe.  You Must read complete instructions/literature along with all the possible adverse reactions/side effects for all the Medicines you take and that have been prescribed to you. Take any new Medicines after you have completely understood and accpet all the possible adverse reactions/side effects.   Please note  You were cared for by a hospitalist during your hospital stay. If you have any questions about your discharge medications or the care you received while you were in the hospital after you are discharged, you can call the unit and asked to speak with the hospitalist on call if the hospitalist that took care of you is not available. Once  you are discharged, your primary care physician will handle any further medical issues. Please note that NO REFILLS for any discharge medications will be authorized once you are discharged, as it is imperative that you return to your primary care physician (or establish a relationship with a primary care physician if you do not have one) for your aftercare needs so that they can reassess your need for medications and monitor your lab values.    On the day of Discharge:   VITAL SIGNS:  Blood pressure 132/60, pulse 55, temperature 98.3 F (36.8 C), temperature source Oral, resp. rate 20, height  (1.473 m), weight 47.356 kg (104 lb 6.4 oz), SpO2 95 %.  I/O:  No intake or output data in the 24 hours ending 10/28/15 1255  PHYSICAL EXAMINATION:  GENERAL:  81 y.o.-year-old patient lying in the bed with no acute distress.  EYES: Pupils equal, round, reactive to light and accommodation. No scleral icterus. Extraocular muscles intact.  HEENT: Head atraumatic, normocephalic. Oropharynx and nasopharynx clear.  NECK:  Supple, no jugular venous distention. No thyroid enlargement, no tenderness.  LUNGS: Normal breath sounds bilaterally, no wheezing, rales,rhonchi or crepitation. No use of accessory muscles of respiration.  CARDIOVASCULAR: S1, S2 normal. No murmurs, rubs, or gallops.  ABDOMEN: Soft, non-tender, non-distended. Bowel sounds present. No organomegaly or mass.  EXTREMITIES: No pedal edema, cyanosis, or clubbing.  NEUROLOGIC: Cranial nerves II through XII are intact. Muscle strength 5/5 in all extremities. Sensation intact. Gait not checked.  PSYCHIATRIC: The patient is alert and oriented x 3.  SKIN: No obvious rash, lesion, or ulcer.   DATA REVIEW:   CBC  Recent Labs Lab 10/25/15 0413  WBC 5.1  HGB 12.7  HCT 37.6  PLT 167    Chemistries   Recent Labs Lab 10/25/15 0413  NA 139  K 4.0  CL 106  CO2 27  GLUCOSE 88  BUN 19  CREATININE 1.16*  CALCIUM 8.8*    Cardiac  Enzymes  Recent Labs Lab 10/24/15 1011  TROPONINI 0.04*    Microbiology Results  No results found for this or any previous visit.  RADIOLOGY:  No results found.   Management plans discussed with the patient, family and they are in agreement.  CODE STATUS:  Advance Directive Documentation        Most Recent Value   Type of Advance Directive  Healthcare Power of Attorney   Pre-existing out of facility DNR order (yellow form or pink MOST form)     "MOST" Form in Place?        TOTAL TIME TAKING CARE OF THIS PATIENT: 55 minutes.    [facility], Tiffany Talarico M.D on 10/28/2015 at 12:55 PM  Between 7am to 6pm - Pager - [phone]  After 6pm go to [email or web address] - password [email or web address] [facility]  [facility]  Office  [phone]  CC: Primary care physician; Barbette Reichmann, MD

## 2015-11-06 ENCOUNTER — Encounter: Payer: Self-pay | Admitting: Family

## 2015-11-06 ENCOUNTER — Ambulatory Visit: Payer: Medicare Other | Attending: Family | Admitting: Family

## 2015-11-06 VITALS — BP 116/68 | HR 49 | Resp 18 | Ht 60.0 in | Wt 104.0 lb

## 2015-11-06 DIAGNOSIS — Z79899 Other long term (current) drug therapy: Secondary | ICD-10-CM | POA: Diagnosis not present

## 2015-11-06 DIAGNOSIS — K219 Gastro-esophageal reflux disease without esophagitis: Secondary | ICD-10-CM | POA: Insufficient documentation

## 2015-11-06 DIAGNOSIS — R001 Bradycardia, unspecified: Secondary | ICD-10-CM | POA: Diagnosis not present

## 2015-11-06 DIAGNOSIS — M810 Age-related osteoporosis without current pathological fracture: Secondary | ICD-10-CM | POA: Diagnosis not present

## 2015-11-06 DIAGNOSIS — I4891 Unspecified atrial fibrillation: Secondary | ICD-10-CM | POA: Insufficient documentation

## 2015-11-06 DIAGNOSIS — I482 Chronic atrial fibrillation, unspecified: Secondary | ICD-10-CM

## 2015-11-06 DIAGNOSIS — R0602 Shortness of breath: Secondary | ICD-10-CM | POA: Insufficient documentation

## 2015-11-06 DIAGNOSIS — R42 Dizziness and giddiness: Secondary | ICD-10-CM | POA: Insufficient documentation

## 2015-11-06 DIAGNOSIS — G629 Polyneuropathy, unspecified: Secondary | ICD-10-CM | POA: Diagnosis not present

## 2015-11-06 DIAGNOSIS — M1990 Unspecified osteoarthritis, unspecified site: Secondary | ICD-10-CM | POA: Insufficient documentation

## 2015-11-06 DIAGNOSIS — Z87891 Personal history of nicotine dependence: Secondary | ICD-10-CM | POA: Insufficient documentation

## 2015-11-06 DIAGNOSIS — R2242 Localized swelling, mass and lump, left lower limb: Secondary | ICD-10-CM

## 2015-11-06 DIAGNOSIS — J302 Other seasonal allergic rhinitis: Secondary | ICD-10-CM

## 2015-11-06 DIAGNOSIS — R2240 Localized swelling, mass and lump, unspecified lower limb: Secondary | ICD-10-CM | POA: Insufficient documentation

## 2015-11-06 DIAGNOSIS — I5032 Chronic diastolic (congestive) heart failure: Secondary | ICD-10-CM | POA: Diagnosis not present

## 2015-11-06 DIAGNOSIS — I739 Peripheral vascular disease, unspecified: Secondary | ICD-10-CM | POA: Diagnosis not present

## 2015-11-06 NOTE — Patient Instructions (Addendum)
Continue weighing daily and call for an overnight weight gain of > 2 pounds or a weekly weight gain of >5 pounds.  Decrease metoprolol to 25mg  twice daily due to heart rate being in the 40's.

## 2015-11-06 NOTE — Progress Notes (Signed)
Subjective:    Patient ID: [name], female    DOB: July 23, 1925, 81 y.o.   MRN: [id number]  Congestive Heart Failure Presents for initial visit. The disease course has been stable. Associated symptoms include fatigue, palpitations and shortness of breath. Pertinent negatives include no abdominal pain, chest pain, edema, orthopnea or paroxysmal nocturnal dyspnea. The symptoms have been stable. Past treatments include digoxin, salt and fluid restriction, angiotensin receptor blockers and beta blockers. The treatment provided moderate relief. Compliance with prior treatments has been good. Her past medical history is significant for arrhythmia. There is no history of CVA, DM or HTN. Compliance with total regimen is 76-100%.  Other This is a recurrent (bradycardia) problem. The current episode started more than 1 year ago. The problem occurs daily. The problem has been unchanged. Associated symptoms include congestion, coughing (with allergies) and fatigue. Pertinent negatives include no abdominal pain, chest pain, headaches, neck pain, numbness, sore throat or weakness. Nothing aggravates the symptoms. She has tried nothing for the symptoms.    Past Medical History  Diagnosis Date  . Atrial fibrillation (HCC)   . PAD (peripheral artery disease) (HCC)     s/p bypass and left leg stents  . Osteoporosis   . Peripheral neuropathy (HCC)   . Degenerative joint disease   . Degenerative arthritis   . Foot ulcer (HCC)     Chronic left ankle ulcer  . GERD (gastroesophageal reflux disease)     Past Surgical History  Procedure Laterality Date  . Femoral bypass surgery      left leg  . Stent placement iliac ([facility] hx)      Left leg  . Abdominal hysterectomy    . Tonsillectomy  age 29    Family History  Problem Relation Age of Onset  . Peripheral vascular disease Father     Social History  Substance Use Topics  . Smoking status: Former Games developer  . Smokeless tobacco: Never Used      Comment: Quit 10 years ago  . Alcohol Use: No    Allergies  Allergen Reactions  . Alendronate Other (See Comments)    Reaction:  Unknown   . Doxycycline Nausea And Vomiting  . Minocycline Nausea And Vomiting  . Oxycodone Other (See Comments)    Reaction:  Hallucinations   . Sulfa Antibiotics Nausea And Vomiting    Prior to Admission medications   Medication Sig Start Date End Date Taking? Authorizing Provider  ALPRAZolam Prudy Feeler) 0.25 MG tablet Take 0.25 mg by mouth at bedtime as needed for sleep.   Yes Historical Provider, MD  cetirizine-pseudoephedrine (ZYRTEC-D) 5-120 MG tablet Take 1 tablet by mouth 2 (two) times daily as needed for allergies.   Yes Historical Provider, MD  clopidogrel (PLAVIX) 75 MG tablet Take 75 mg by mouth daily.   Yes Historical Provider, MD  digoxin (LANOXIN) 0.125 MG tablet Take 0.125 mg by mouth daily.   Yes Historical Provider, MD  docusate sodium (COLACE) 100 MG capsule Take 100 mg by mouth 2 (two) times daily as needed for mild constipation.   Yes Historical Provider, MD  fluticasone (FLONASE) 50 MCG/ACT nasal spray Place 2 sprays into both nostrils at bedtime as needed for rhinitis.   Yes Historical Provider, MD  furosemide (LASIX) 20 MG tablet Take 1 tablet (20 mg total) by mouth daily. Can use additional 20 mg PO lasix as need for worsening SOB, LEG SWELLING or more than 3 lbs wt gain in a week 10/25/15  Yes Vipul Sherryll Burger,  MD  gabapentin (NEURONTIN) 300 MG capsule Take 300 mg by mouth 2 (two) times daily.    Yes Historical Provider, MD  HYDROcodone-acetaminophen (NORCO/VICODIN) 5-325 MG tablet Take 1 tablet by mouth 2 (two) times daily as needed for moderate pain.   Yes Historical Provider, MD  losartan (COZAAR) 50 MG tablet Take 50 mg by mouth daily.   Yes Historical Provider, MD  metoprolol (LOPRESSOR) 50 MG tablet Take 50 mg by mouth 2 (two) times daily.   Yes Historical Provider, MD  phenylephrine (SUDAFED PE) 10 MG TABS tablet Take 10 mg by mouth every  4 (four) hours as needed (for congestion).   Yes Historical Provider, MD  ranitidine (ZANTAC) 150 MG tablet Take 150 mg by mouth 2 (two) times daily as needed for heartburn.   Yes Historical Provider, MD  rOPINIRole (REQUIP) 2 MG tablet Take 2 mg by mouth at bedtime.   Yes Historical Provider, MD  simvastatin (ZOCOR) 10 MG tablet Take 10 mg by mouth at bedtime.   Yes Historical Provider, MD  Vitamin D, Ergocalciferol, (DRISDOL) 50000 UNITS CAPS capsule Take 50,000 Units by mouth every 7 (seven) days. Pt takes on Tuesday.   Yes Historical Provider, MD     Review of Systems  Constitutional: Positive for fatigue. Negative for appetite change.  HENT: Positive for congestion. Negative for rhinorrhea and sore throat.   Eyes: Negative for pain and visual disturbance.  Respiratory: Positive for cough (with allergies) and shortness of breath. Negative for chest tightness and wheezing.   Cardiovascular: Positive for palpitations. Negative for chest pain and leg swelling.  Gastrointestinal: Negative for abdominal pain and abdominal distention.  Endocrine: Negative.   Genitourinary: Negative.   Musculoskeletal: Positive for neck stiffness (right side of the neck). Negative for back pain and neck pain.  Skin:       Chronic lump on medial side of left knee  Allergic/Immunologic: Negative.   Neurological: Positive for dizziness and light-headedness. Negative for weakness, numbness and headaches.  Hematological: Negative for adenopathy. Bruises/bleeds easily.  Psychiatric/Behavioral: Negative for sleep disturbance (sleeping on 1 pillow) and dysphoric mood. The patient is not nervous/anxious.        Objective:   Physical Exam  Constitutional: She is oriented to person, place, and time. She appears well-developed and well-nourished.  HENT:  Head: Normocephalic and atraumatic.  Eyes: Conjunctivae are normal. Pupils are equal, round, and reactive to light.  Neck: Normal range of motion. Neck supple.   Cardiovascular: An irregular rhythm present. Bradycardia present.   Pulmonary/Chest: Effort normal. She has no wheezes. She has no rales.  Abdominal: Soft. She exhibits no distension. There is no tenderness.  Musculoskeletal: She exhibits no edema or tenderness.  Neurological: She is alert and oriented to person, place, and time.  Skin: Skin is warm and dry. Rash noted. Rash is nodular (firm nodule medial posterior part of knee).  Psychiatric: She has a normal mood and affect. Her behavior is normal. Thought content normal.  Nursing note and vitals reviewed.   BP 116/68 mmHg  Pulse 49  Resp 18  Ht 5' (1.524 m)  Wt 104 lb (47.174 kg)  BMI 20.31 kg/m2  SpO2 95%       Assessment & Plan:  1: Chronic heart failure with preserved ejection fraction- Patient presents with some fatigue and shortness of breath upon exertion. They got lost coming to the appointment today so had to walk quite a distance and eventually had to get a wheelchair because she was getting tired.  She says that after she sits down for a little bit, her energy level improves fairly quickly. She is already weighing herself daily and says that her weight has been stable. Instructed to call for an overnight weight gain of >2 pounds or a weekly weight gain of >5 pounds. She does use a little salt usually when eating raw vegetables. Discussed a 2000mg  sodium diet and written information was given to her about that. She has already received her flu vaccine this season. [facility] went in the room and reviewed medications with the patient and her daughter. 2: Bradycardia- Patient's heartrate is in the 40's and she does feel fatigued and dizzy at times. Will decrease her metoprolol to 25mg  twice daily (1/2 tablet BID). Patient's daughter says that she does have a pill cutter at home.  3: Chronic atrial fibrillation- Is on plavix and digoxin along with the metroprolol.  4: Lump of left lower extremity- Patient has a firm nodule along  the medial lower portion of her knee where her bypass incision is. She says that sometimes it's more firm and other times, it's actually soft. She's been told that this lump is related to her femoral bypass and that eventually it should go away. Encouraged her to followup with her PCP regarding this. 5: Seasonal allergies- She has both zyrtec-D and sudafed listed but her daughter says that she doesn't take both at the same time. She says that she usually takes the zyrtec-D on a daily basis but that when she's really congested, she will take the sudafed.   Return here in 1 month or sooner for any questions/problems before then.

## 2015-12-07 ENCOUNTER — Encounter: Payer: Self-pay | Admitting: Family

## 2015-12-07 ENCOUNTER — Ambulatory Visit: Payer: Medicare Other | Attending: Family | Admitting: Family

## 2015-12-07 VITALS — BP 119/65 | HR 68 | Resp 20 | Ht 60.0 in | Wt 106.0 lb

## 2015-12-07 DIAGNOSIS — I5032 Chronic diastolic (congestive) heart failure: Secondary | ICD-10-CM | POA: Diagnosis not present

## 2015-12-07 DIAGNOSIS — Z9889 Other specified postprocedural states: Secondary | ICD-10-CM | POA: Diagnosis not present

## 2015-12-07 DIAGNOSIS — M810 Age-related osteoporosis without current pathological fracture: Secondary | ICD-10-CM | POA: Diagnosis not present

## 2015-12-07 DIAGNOSIS — K219 Gastro-esophageal reflux disease without esophagitis: Secondary | ICD-10-CM | POA: Insufficient documentation

## 2015-12-07 DIAGNOSIS — M1990 Unspecified osteoarthritis, unspecified site: Secondary | ICD-10-CM | POA: Diagnosis not present

## 2015-12-07 DIAGNOSIS — Z87891 Personal history of nicotine dependence: Secondary | ICD-10-CM | POA: Insufficient documentation

## 2015-12-07 DIAGNOSIS — Z951 Presence of aortocoronary bypass graft: Secondary | ICD-10-CM | POA: Diagnosis not present

## 2015-12-07 DIAGNOSIS — Z888 Allergy status to other drugs, medicaments and biological substances status: Secondary | ICD-10-CM | POA: Insufficient documentation

## 2015-12-07 DIAGNOSIS — I482 Chronic atrial fibrillation, unspecified: Secondary | ICD-10-CM

## 2015-12-07 DIAGNOSIS — M25462 Effusion, left knee: Secondary | ICD-10-CM | POA: Diagnosis not present

## 2015-12-07 DIAGNOSIS — R001 Bradycardia, unspecified: Secondary | ICD-10-CM | POA: Diagnosis not present

## 2015-12-07 DIAGNOSIS — I739 Peripheral vascular disease, unspecified: Secondary | ICD-10-CM | POA: Insufficient documentation

## 2015-12-07 DIAGNOSIS — G629 Polyneuropathy, unspecified: Secondary | ICD-10-CM | POA: Insufficient documentation

## 2015-12-07 DIAGNOSIS — Z79899 Other long term (current) drug therapy: Secondary | ICD-10-CM | POA: Diagnosis not present

## 2015-12-07 DIAGNOSIS — R2242 Localized swelling, mass and lump, left lower limb: Secondary | ICD-10-CM

## 2015-12-07 NOTE — Progress Notes (Signed)
Subjective:    Patient ID: [name], female    DOB: 06/21/1925, 82 y.o.   MRN: [id number]  Congestive Heart Failure Presents for follow-up visit. The disease course has been stable. Associated symptoms include fatigue and shortness of breath (when rushing around). Pertinent negatives include no abdominal pain, chest pain, edema, orthopnea or palpitations. The symptoms have been stable. Past treatments include salt and fluid restriction, digoxin, beta blockers and angiotensin receptor blockers. The treatment provided moderate relief. Compliance with prior treatments has been good. Her past medical history is significant for arrhythmia. There is no history of CVA, DM or HTN. Compliance with total regimen is 76-100%.  Atrial Fibrillation Presents for follow-up visit. Symptoms include dizziness and shortness of breath (when rushing around). Symptoms are negative for bradycardia, chest pain, hypertension, palpitations and weakness. The symptoms have been stable. Past treatments include beta blockers. Compliance with prior treatments has been good. Past medical history includes atrial fibrillation and CHF. There is no history of HTN. There are no medication compliance problems.    Past Medical History  Diagnosis Date  . Atrial fibrillation (HCC)   . PAD (peripheral artery disease) (HCC)     s/p bypass and left leg stents  . Osteoporosis   . Peripheral neuropathy (HCC)   . Degenerative joint disease   . Degenerative arthritis   . Foot ulcer (HCC)     Chronic left ankle ulcer  . GERD (gastroesophageal reflux disease)     Past Surgical History  Procedure Laterality Date  . Femoral bypass surgery      left leg  . Stent placement iliac ([facility] hx)      Left leg  . Abdominal hysterectomy    . Tonsillectomy  age 89    Family History  Problem Relation Age of Onset  . Peripheral vascular disease Father     Social History  Substance Use Topics  . Smoking status: Former Games developermoker  .  Smokeless tobacco: Never Used     Comment: Quit 10 years ago  . Alcohol Use: No    Allergies  Allergen Reactions  . Alendronate Other (See Comments)    Reaction:  Unknown   . Doxycycline Nausea And Vomiting  . Minocycline Nausea And Vomiting  . Oxycodone Other (See Comments)    Reaction:  Hallucinations   . Sulfa Antibiotics Nausea And Vomiting    Prior to Admission medications   Medication Sig Start Date End Date Taking? Authorizing Provider  ALPRAZolam Prudy Feeler(XANAX) 0.25 MG tablet Take 0.25 mg by mouth at bedtime as needed for sleep.   Yes Historical Provider, MD  cetirizine-pseudoephedrine (ZYRTEC-D) 5-120 MG tablet Take 1 tablet by mouth 2 (two) times daily as needed for allergies.   Yes Historical Provider, MD  clopidogrel (PLAVIX) 75 MG tablet Take 75 mg by mouth daily.   Yes Historical Provider, MD  digoxin (LANOXIN) 0.125 MG tablet Take 0.125 mg by mouth 4 (four) times a week.   Yes Historical Provider, MD  docusate sodium (COLACE) 100 MG capsule Take 100 mg by mouth 2 (two) times daily as needed for mild constipation.   Yes Historical Provider, MD  fluticasone (FLONASE) 50 MCG/ACT nasal spray Place 2 sprays into both nostrils at bedtime as needed for rhinitis.   Yes Historical Provider, MD  furosemide (LASIX) 20 MG tablet Take 1 tablet (20 mg total) by mouth daily. Can use additional 20 mg PO lasix as need for worsening SOB, LEG SWELLING or more than 3 lbs wt gain in  a week 10/25/15  Yes Vipul Sherryll Burger, MD  gabapentin (NEURONTIN) 300 MG capsule Take 300 mg by mouth 2 (two) times daily.    Yes Historical Provider, MD  HYDROcodone-acetaminophen (NORCO/VICODIN) 5-325 MG tablet Take 1 tablet by mouth 2 (two) times daily as needed for moderate pain.   Yes Historical Provider, MD  losartan (COZAAR) 50 MG tablet Take 50 mg by mouth daily.   Yes Historical Provider, MD  metoprolol (LOPRESSOR) 50 MG tablet Take 25 mg by mouth 2 (two) times daily.   Yes Historical Provider, MD  phenylephrine  (SUDAFED PE) 10 MG TABS tablet Take 10 mg by mouth every 4 (four) hours as needed (for congestion).   Yes Historical Provider, MD  ranitidine (ZANTAC) 150 MG tablet Take 150 mg by mouth 2 (two) times daily as needed for heartburn.   Yes Historical Provider, MD  rOPINIRole (REQUIP) 2 MG tablet Take 2 mg by mouth at bedtime.   Yes Historical Provider, MD  simvastatin (ZOCOR) 10 MG tablet Take 10 mg by mouth at bedtime.   Yes Historical Provider, MD  Vitamin D, Ergocalciferol, (DRISDOL) 50000 UNITS CAPS capsule Take 50,000 Units by mouth every 7 (seven) days. Pt takes on Tuesday.   Yes Historical Provider, MD     Review of Systems  Constitutional: Positive for fatigue. Negative for appetite change.  HENT: Positive for postnasal drip. Negative for congestion and sore throat.   Eyes: Negative.   Respiratory: Positive for cough and shortness of breath (when rushing around). Negative for chest tightness.   Cardiovascular: Negative for chest pain, palpitations and leg swelling.  Gastrointestinal: Negative for abdominal pain and abdominal distention.  Endocrine: Negative.   Genitourinary: Negative.   Musculoskeletal: Positive for arthralgias (left ankle).  Skin: Negative.   Allergic/Immunologic: Negative.   Neurological: Positive for dizziness. Negative for weakness, light-headedness and headaches.  Hematological: Negative for adenopathy. Bruises/bleeds easily.  Psychiatric/Behavioral: Negative for sleep disturbance (sleeping on  1 pillow) and dysphoric mood. The patient is not hyperactive.        Objective:   Physical Exam  Constitutional: She is oriented to person, place, and time. She appears well-developed and well-nourished.  HENT:  Head: Normocephalic and atraumatic.  Eyes: Conjunctivae are normal. Pupils are equal, round, and reactive to light.  Neck: Normal range of motion. Neck supple.  Cardiovascular: Normal rate.  An irregular rhythm present.  Pulmonary/Chest: Effort normal. She  has no wheezes. She has no rales.  Abdominal: Soft. She exhibits no distension. There is no tenderness.  Musculoskeletal: She exhibits no edema or tenderness.  Neurological: She is alert and oriented to person, place, and time.  Skin: Skin is warm and dry.  Chronic lump on the medial side of left knee  Psychiatric: She has a normal mood and affect. Her behavior is normal. Thought content normal.  Nursing note and vitals reviewed.   BP 119/65 mmHg  Pulse 68  Resp 20  Ht 5' (1.524 m)  Wt 106 lb (48.081 kg)  BMI 20.70 kg/m2       Assessment & Plan:  1: Chronic heart failure with preserved ejection fraction- Patient presents with fatigue at times and shortness of breath when she has to rush/hurry around. She denied feeling short of breath upon walking into the office today. She has continued to weigh herself daily and reports a stable weight. Reminded to call for an overnight weight gain of >2 pounds or a weekly weight gain of >5 pounds. She is not adding any salt to  her food and tries to follow a low sodium diet.  2: Chronic atrial fibrillation- Currently rate controlled at this time. She does say that her digoxin has been decreased to four times a week.  3: Bradycardia- Metoprolol was decreased at her last visit and her heart rate has rebounded nicely. Digoxin has also been decreased. Heart rate today in the 60's. 4: Lump of left leg- Patient has had this lump on the inside of the left knee for a few months. Is being followed by her PCP. No pain over it and no numbness/tingling.  Return here in 3 months or sooner for any questions/problems before then.

## 2015-12-07 NOTE — Patient Instructions (Signed)
Continue weighing daily and call for an overnight weight gain of > 2 pounds or a weekly weight gain of >5 pounds. 

## 2016-03-29 ENCOUNTER — Ambulatory Visit: Payer: Medicare Other | Admitting: Family

## 2016-09-16 ENCOUNTER — Other Ambulatory Visit: Payer: Self-pay | Admitting: Internal Medicine

## 2016-09-16 DIAGNOSIS — R1011 Right upper quadrant pain: Secondary | ICD-10-CM

## 2016-09-17 ENCOUNTER — Ambulatory Visit
Admission: RE | Admit: 2016-09-17 | Discharge: 2016-09-17 | Disposition: A | Discharge status: Home / Self Care | Payer: Medicare Other | Source: Ambulatory Visit | Attending: Internal Medicine | Admitting: Internal Medicine

## 2016-09-17 DIAGNOSIS — I480 Paroxysmal atrial fibrillation: Secondary | ICD-10-CM | POA: Diagnosis not present

## 2016-09-17 DIAGNOSIS — M150 Primary generalized (osteo)arthritis: Secondary | ICD-10-CM | POA: Insufficient documentation

## 2016-09-17 DIAGNOSIS — N200 Calculus of kidney: Secondary | ICD-10-CM | POA: Diagnosis not present

## 2016-09-17 DIAGNOSIS — N261 Atrophy of kidney (terminal): Secondary | ICD-10-CM | POA: Insufficient documentation

## 2016-09-17 DIAGNOSIS — I700 Atherosclerosis of aorta: Secondary | ICD-10-CM | POA: Diagnosis not present

## 2016-09-17 DIAGNOSIS — I10 Essential (primary) hypertension: Secondary | ICD-10-CM | POA: Diagnosis not present

## 2016-09-17 DIAGNOSIS — R1011 Right upper quadrant pain: Secondary | ICD-10-CM | POA: Diagnosis present

## 2016-09-17 DIAGNOSIS — L97909 Non-pressure chronic ulcer of unspecified part of unspecified lower leg with unspecified severity: Secondary | ICD-10-CM | POA: Insufficient documentation

## 2016-09-17 HISTORY — DX: Essential (primary) hypertension: I10

## 2016-09-17 MED ORDER — IOPAMIDOL (ISOVUE-300) INJECTION 61%
75.0000 mL | Freq: Once | INTRAVENOUS | Status: AC | PRN
  Administered 2016-09-17: 75 mL via INTRAVENOUS

## 2016-09-17 MED ORDER — IOPAMIDOL (ISOVUE-370) INJECTION 76%: 75.0000 mL | Freq: Once | INTRAVENOUS | Status: DC | PRN

## 2017-02-27 DIAGNOSIS — M24562 Contracture, left knee: Secondary | ICD-10-CM | POA: Insufficient documentation

## 2017-02-27 DIAGNOSIS — T792XXA Traumatic secondary and recurrent hemorrhage and seroma, initial encounter: Secondary | ICD-10-CM | POA: Insufficient documentation

## 2017-02-27 DIAGNOSIS — R2681 Unsteadiness on feet: Secondary | ICD-10-CM | POA: Insufficient documentation

## 2017-04-01 ENCOUNTER — Ambulatory Visit: Payer: Medicare Other | Attending: Internal Medicine

## 2017-04-01 DIAGNOSIS — M79672 Pain in left foot: Secondary | ICD-10-CM | POA: Diagnosis present

## 2017-04-01 DIAGNOSIS — R262 Difficulty in walking, not elsewhere classified: Secondary | ICD-10-CM | POA: Diagnosis not present

## 2017-04-01 DIAGNOSIS — M25672 Stiffness of left ankle, not elsewhere classified: Secondary | ICD-10-CM | POA: Diagnosis present

## 2017-04-01 NOTE — Therapy (Signed)
[facility] [place] [facility] PHYSICAL AND SPOR[facility] MEDICINE [address], [place], [address] Phone: [phone]   Fax:  [phone]  Physical Therapy Evaluation  Patient Details  Name: [name] MRN: [id number] Date of Birth: 02/02/1925 Referring Provider: Dr. Marcello Fennel  Encounter Date: 04/01/2017      PT End of Session - 04/01/17 1433    Visit Number 1   Number of Visits 17   Date for PT Re-Evaluation 05/27/17   Authorization Type 1 / 10 G Code   PT Start Time 1100   PT Stop Time 1200   PT Time Calculation (min) 60 min   Activity Tolerance Patient tolerated treatment well   Behavior During Therapy [facility] for tasks assessed/performed      Past Medical History:  Diagnosis Date  . Atrial fibrillation (HCC)   . Degenerative arthritis   . Degenerative joint disease   . Foot ulcer (HCC)    Chronic left ankle ulcer  . GERD (gastroesophageal reflux disease)   . Hypertension   . Osteoporosis   . PAD (peripheral artery disease) (HCC)    s/p bypass and left leg stents  . Peripheral neuropathy     Past Surgical History:  Procedure Laterality Date  . ABDOMINAL HYSTERECTOMY    . Femoral bypass surgery     left leg  . STENT PLACEMENT ILIAC ([facility] HX)     Left leg  . TONSILLECTOMY  age 18    There were no vitals filed for this visit.       Subjective Assessment - 04/01/17 1123    Subjective Patient reports increased L ankle/foot pain. Patient reports difficulty with bending down to grab soemthing out of a low cabinet, walking for long periods of time, reaching for items in a high shelf, . Patient reports she avoids certain activties secondary to wound on the L ankle. Patient reports she would like to get better at walking (especially over uneven ground), her balance, and decrease her ankle pain, to be able to walk over uneven surfaces. Patient reports increased L ankle pain when ambulating and performing increased weight bearing positions. Patient  reports she feels unsteadiness on her feet when changing directions quickly. Worse pain in the past week in the foot/ankle is 7/10; the best in the ankle is a 0/10      Patient is accompained by: Family member  daughter   Pertinent History PMH: Wound in the L ankle/foot since 2014; poor LE circulation; Low BP   Limitations Standing;Walking   How long can you walk comfortably? 184ft   Patient Stated Goals To improve walking throughout   Currently in Pain? Yes   Pain Score 4    Pain Location Ankle   Pain Orientation Left   Pain Descriptors / Indicators Stabbing   Pain Type Chronic pain   Pain Onset More than a month ago   Pain Frequency Intermittent            [facility] PT Assessment - 04/01/17 1113      Assessment   Medical Diagnosis L foot and ankle pain   Referring Provider Dr. Marcello Fennel   Onset Date/Surgical Date 07/02/12   Hand Dominance Right   Next MD Visit 06/12/17   Prior Therapy yes - home health PT      Precautions   Precautions Fall     Balance Screen   Has the patient fallen in the past 6 months No   Has the patient had a decrease in activity  level because of a fear of falling?  Yes   Is the patient reluctant to leave their home because of a fear of falling?  Yes     Home Environment   Living Environment Private residence   Living Arrangements Alone   Available Help at Discharge Family   Type of Home House   Home Access Level entry   Home Layout One level   Home Equipment Walker - 2 wheels;Cane - single point  Rollator     Prior Function   Level of Independence Independent with household mobility with device   Vocation Retired   Gaffer N/A   Leisure [employer]   Overall Cognitive Status Within Functional Limits for tasks assessed     Observation/Other Assessments   Other Surveys  Other Surveys   Lower Extremity Functional Scale  35/80     Observation/Other Assessments-Edema    Edema --  Increased diffuse pitting  edema along L Foot/ankle     ROM / Strength   AROM / PROM / Strength AROM;Strength     AROM   AROM Assessment Site Hip;Knee;Ankle   Right/Left Hip Right;Left   Right Hip Flexion 110   Right Hip ABduction 30   Right Hip ADduction 30   Left Hip Flexion 110   Left Hip ABduction 30   Left Hip ADduction 30   Right/Left Knee Right;Left   Right Knee Extension 0   Right Knee Flexion 110   Left Knee Extension 0   Left Knee Flexion 110   Right/Left Ankle Right;Left   Right Ankle Dorsiflexion 0   Right Ankle Plantar Flexion 50   Right Ankle Inversion 30   Right Ankle Eversion 15   Left Ankle Dorsiflexion -20   Left Ankle Plantar Flexion 45   Left Ankle Inversion 5   Left Ankle Eversion 5     Strength   Strength Assessment Site Ankle;Hip;Knee   Right/Left Hip Right;Left   Right Hip Flexion 4/5   Right Hip ABduction 4/5   Right Hip ADduction 4/5   Left Hip Flexion 4-/5   Left Hip ABduction 4/5   Left Hip ADduction 4/5   Right/Left Knee Right;Left   Right Knee Flexion 4/5   Right Knee Extension 4+/5   Left Knee Flexion 4/5   Left Knee Extension 4+/5   Right/Left Ankle Right;Left   Right Ankle Dorsiflexion 4+/5   Right Ankle Plantar Flexion 4+/5   Left Ankle Dorsiflexion 3+/5   Left Ankle Plantar Flexion 2+/5     Palpation   Palpation comment TTP: along lateral aspect on ankle over TC joint     Special Tests    Special Tests Ankle/Foot Special Tests   Ankle/Foot Special Tests  --  Push up test heel raise : not performed secondary pain      Transfers   Five time sit to stand comments  75sec     Ambulation/Gait   Assistive device Straight cane   Gait velocity Below speed for age range   Gait Comments Decreased Side length bilateral, decreased weight shift onto the L LE, antalgic gait pattern, increased hip ER and toe out bilaterally with weight acceptance       Observation: Decreased toe flexion/extension on the affected L side; Bunion noted on L 1st MTP  joint   Treatment: Sit to stand to be performed at home with use of UEs -- 2 x 5  Ankle pumps in sitting -- 1 min to be  performed throughout day at home         PT Education - 04/15/17 1432    Education provided Yes   Education Details HEP: ankle pumps, sit to stands for speed   Person(s) Educated Patient   Methods Explanation;Demonstration   Comprehension Verbalized understanding;Returned demonstration             PT Long Term Goals - Apr 15, 2017 1458      PT LONG TERM GOAL #1   Title Pt will be independent with HEP focus on improving ankle mobility and strength to continue benefits of therapy after D/C.   Baseline Dependent with Exercise and form   Time 8   Period Weeks   Status New     PT LONG TERM GOAL #2   Title Patient will improve 5xSTS to under 30sec without use of UE's to indicate singificant improvement in fall risk and functional LE strengthening   Baseline 75sec with use of UE's   Time 8   Period Weeks   Status New     PT LONG TERM GOAL #3   Title Patient will improve LEFS to over 50 points to demonstrate significant improvement in LE dysfunction and greater ability to perform ADLs.   Baseline LEFS: 35/ 80   Time 8   Period Weeks   Status New     PT LONG TERM GOAL #4   Title Patient will improve ankle dorsiflexion to 10 degrees to allow for significant improvement in ankle mobility and decreased risk of ankle injury   Baseline -20 degrees    Time 8   Period Weeks   Status New               Plan - 15-Apr-2017 1438    Clinical Impression Statement Patient is an 83 yo right hand dominant female presenting with increased L sided foot and ankle pain secondary to deconditioning and LE weakness. Patient always demonstrates increased balance and fall risk as indicated by decreased time to perform 5xSTS with use of UE's (75sec). Patient LE dysfunction is also indicated by difficulty ambulating and decreased score on the LEFS. Patient demonstrates  significant weakness and decreased mobility throughout L LE and will benefit from further skilled therapy to return to prior level of function.    Rehab Potential Fair   Clinical Impairments Affecting Rehab Potential (-) Chronicity of condition (+) family support, highly motivated   PT Frequency 2x / week   PT Duration 8 weeks   PT Treatment/Interventions Gait training;Stair training;ADLs/Self Care Home Management;Electrical Stimulation;Cryotherapy;Ultrasound;Moist Heat;Iontophoresis /ml Dexamethasone;Functional mobility training;Therapeutic activities;Therapeutic exercise;Patient/family education;Neuromuscular re-education;Balance training;Manual techniques;Passive range of motion   PT Next Visit Plan Improve ankle mobility with manual techniques, improve ankle/LE strength   Consulted and Agree with Plan of Care Patient      Patient will benefit from skilled therapeutic intervention in order to improve the following deficits and impairments:  Abnormal gait, Pain, Impaired sensation, Improper body mechanics, Decreased coordination, Decreased mobility, Increased muscle spasms, Postural dysfunction, Decreased endurance, Decreased range of motion, Difficulty walking, Decreased balance, Decreased strength  Visit Diagnosis: Difficulty in walking, not elsewhere classified - Plan: PT plan of care cert/re-cert  Pain in left foot - Plan: PT plan of care cert/re-cert  Stiffness of left ankle, not elsewhere classified - Plan: PT plan of care cert/re-cert      G-Codes - 04-15-2017 1607    Functional Assessment Tool Used (Outpatient Only) 5XSTS, LEFS, MMT, AROM, Clinical Judgement   Functional Limitation Changing and maintaining body  position   Changing and Maintaining Body Position Current Status [phone]) At least 40 percent but less than 60 percent impaired, limited or restricted   Changing and Maintaining Body Position Goal Status ([id number]) At least 20 percent but less than 40 percent impaired, limited  or restricted       Problem List Patient Active Problem List   Diagnosis Date Noted  . Chronic diastolic heart failure (HCC) 11/06/2015  . Bradycardia 11/06/2015  . Atrial fibrillation (HCC) 11/06/2015  . Lump of skin of lower extremity 11/06/2015  . Seasonal allergies 11/06/2015  . Pressure ulcer 10/24/2015    Myrene Galas, PT DPT 04/01/2017, 5:09 PM  [facility] PHYS[facility] AND SPORTS MEDICINE [address] [place], [address] Phone: [phone]   Fax:  [phone]  Name: [name] MRN: [id number] Date of Birth: 06/07/1925

## 2017-04-08 ENCOUNTER — Ambulatory Visit: Payer: Medicare Other

## 2017-04-14 ENCOUNTER — Ambulatory Visit: Payer: Medicare Other

## 2017-11-07 IMAGING — CR DG CHEST 2V
1 series · 2 of 2 positions shown · non-contrast
Comparison: Port from 12/22/2014

CLINICAL DATA: Hypoxia. Shortness of breath. Cough. Symptoms
started on [REDACTED].

EXAM:
CHEST  2 VIEW

[Series 1: dg chest 2 view · 0.14mm/px · 2 of 2 slices shown]
[im 1/2]
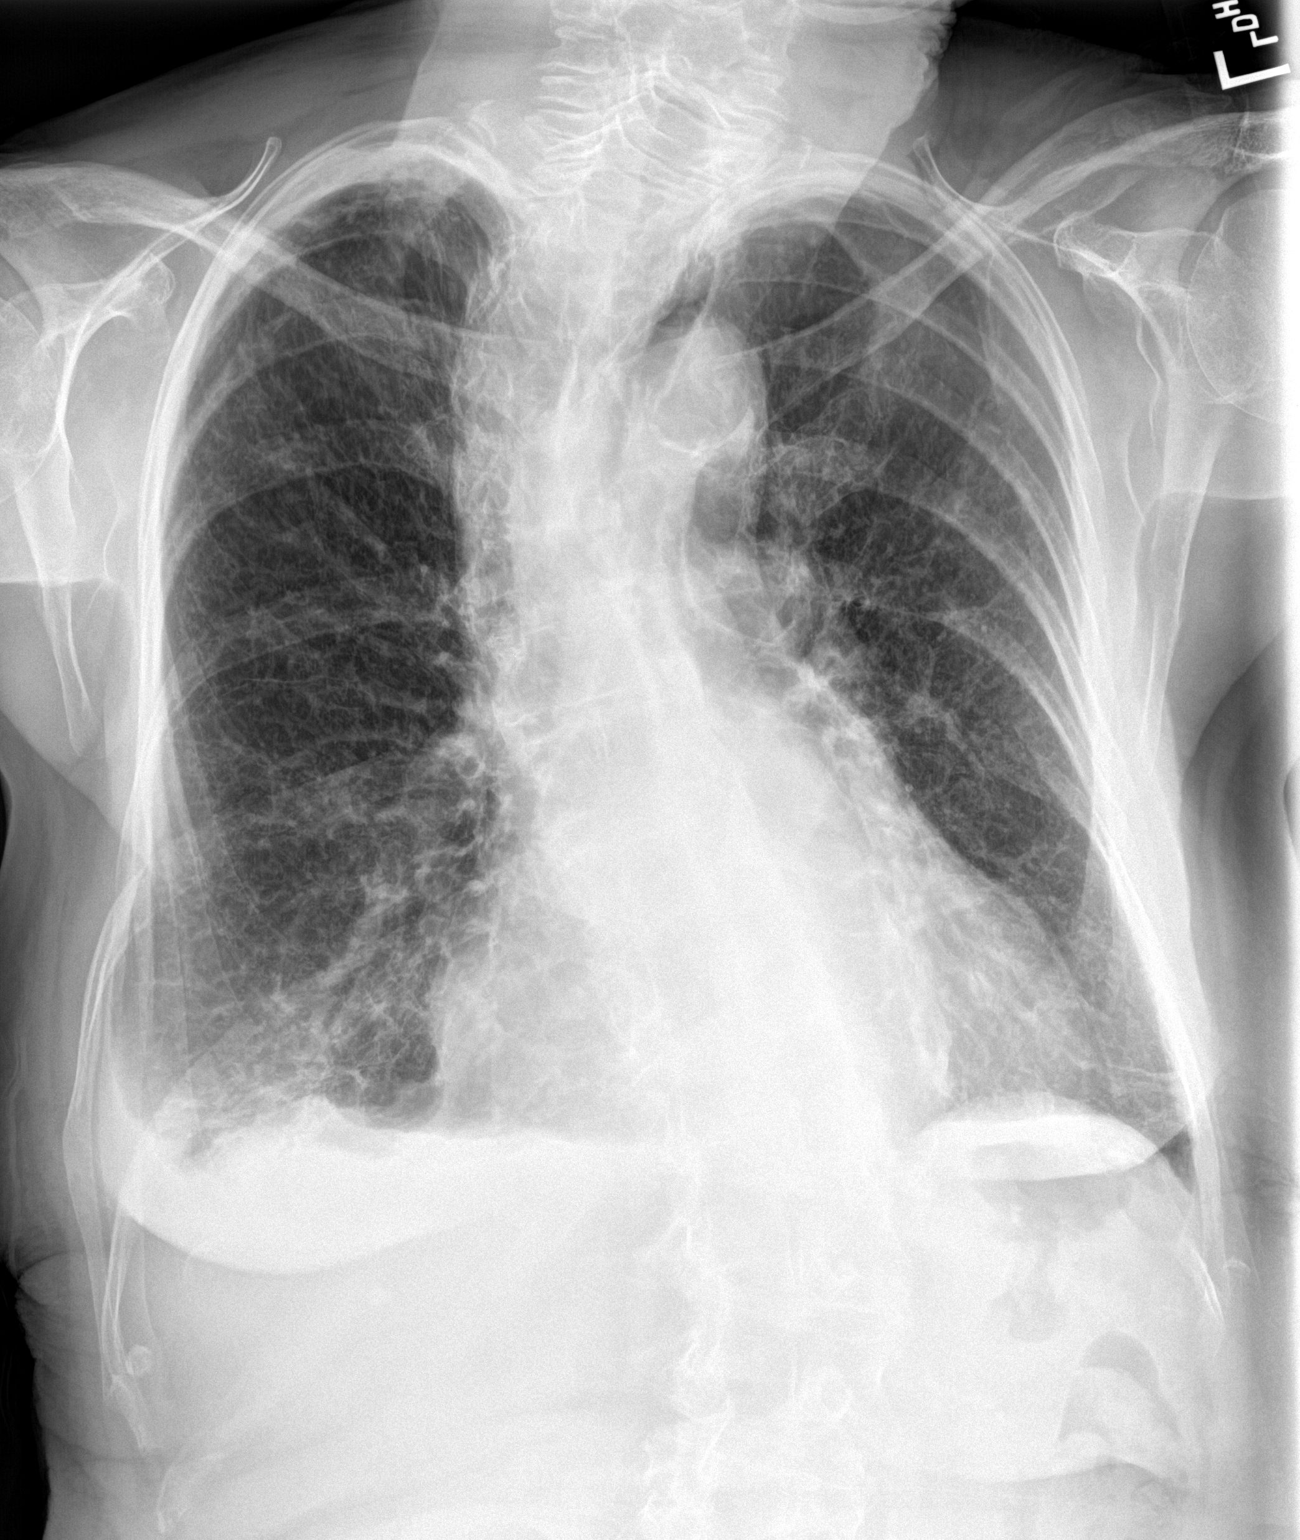
[im 2/2]
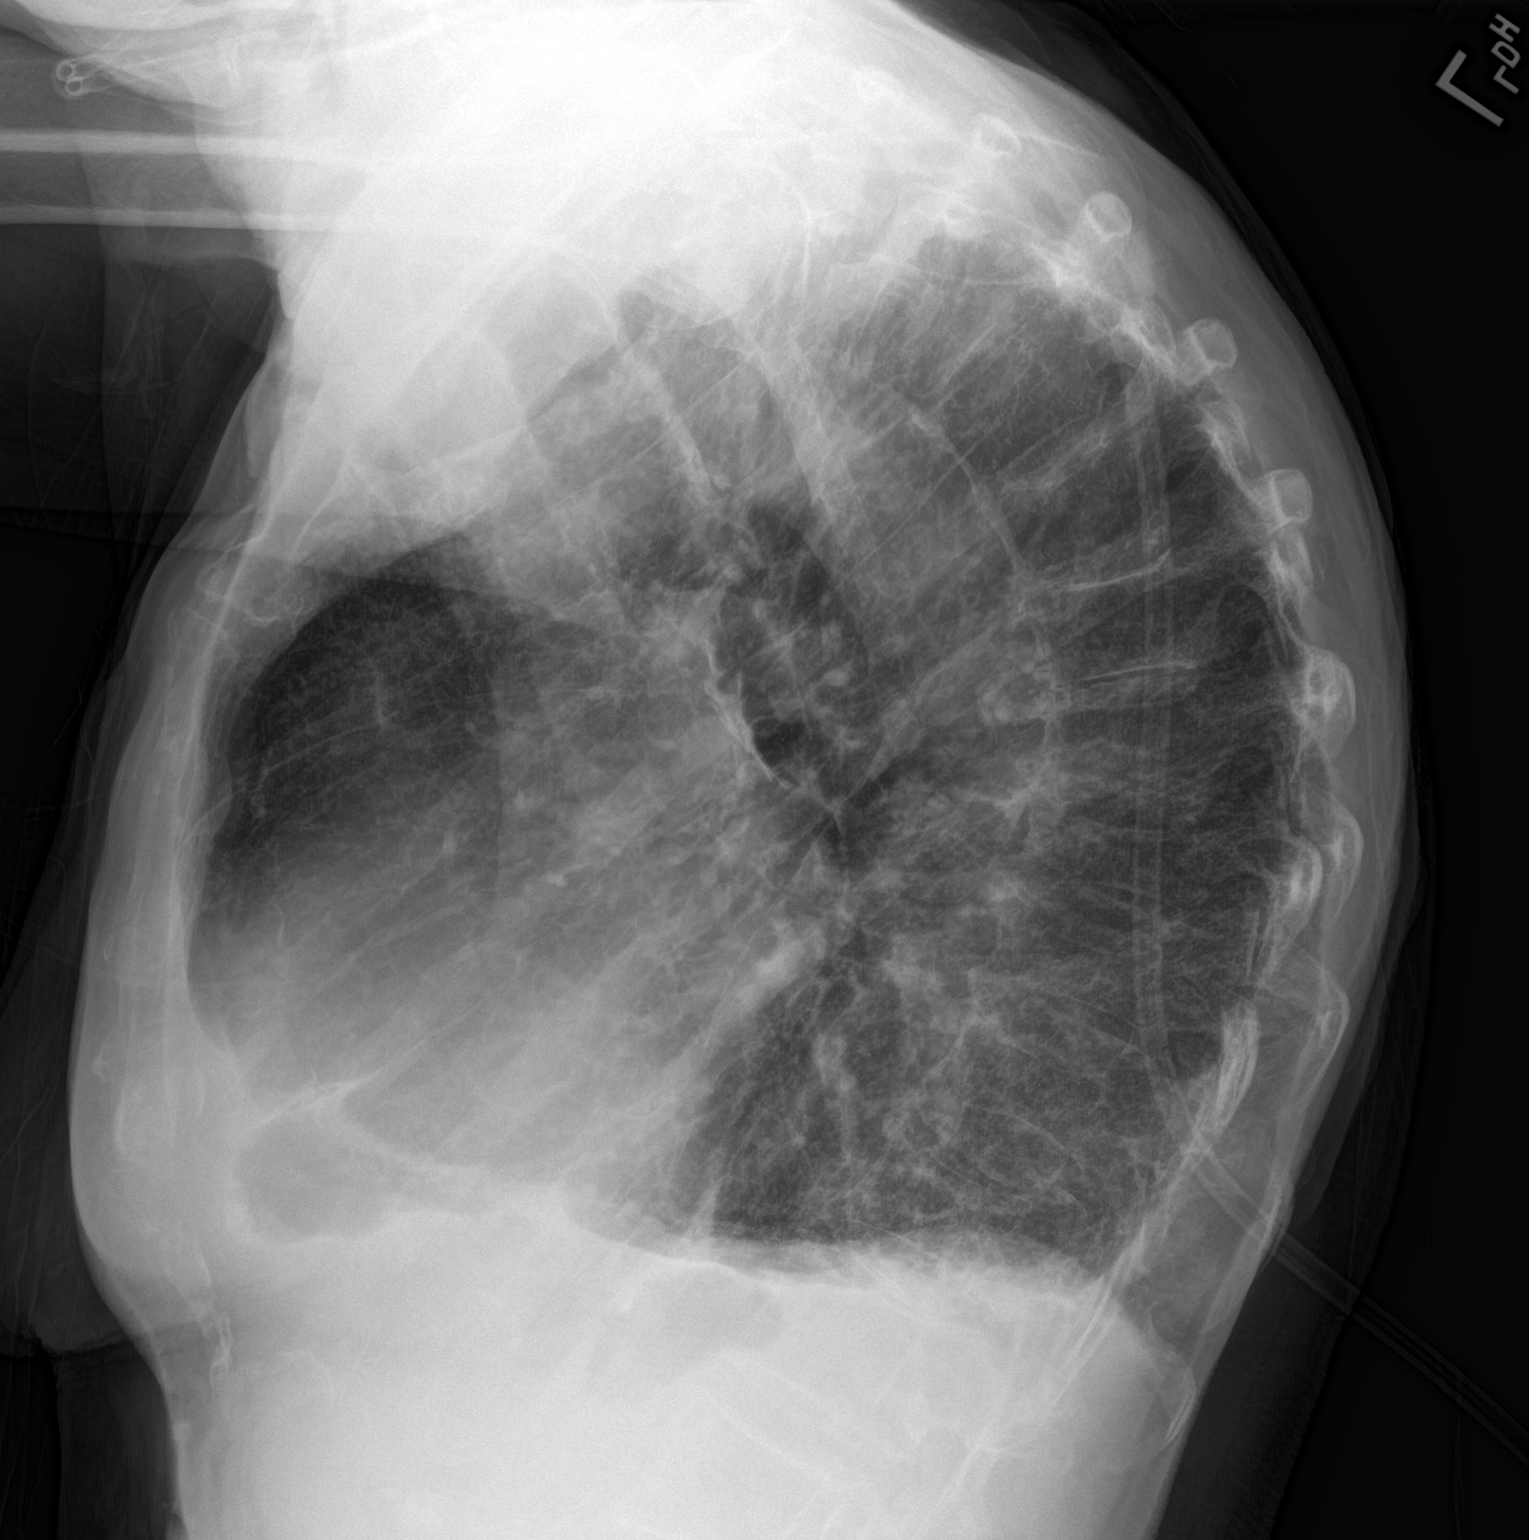

[2 of 2 positions shown; findings below may reference images not displayed]

FINDINGS: Moderate enlargement of the cardiopericardial silhouette. Tortuous
and atherosclerotic thoracic aorta. Small right pleural effusion.

Indistinct pulmonary vasculature with accentuated interstitium
especially in the lung bases. Suspected Kerley B-lines.

Expanded AP diameter of the chest suggesting COPD. Thoracic
spondylosis. Thoracic kyphosis. Dextroconvex upper thoracic
scoliosis.

Right greater than left apical pleural parenchymal scarring.
IMPRESSION: 1. Moderate enlargement of the cardiopericardial silhouette with
Kerley B-lines and interstitial accentuation suggesting interstitial
pulmonary edema.
2. Small right pleural effusion.
3. Atherosclerotic aorta.

## 2017-11-09 IMAGING — CR DG CHEST 2V
1 series · 2 of 2 positions shown · non-contrast
Comparison: 10/23/2015

CLINICAL DATA: Cough, shortness of Breath

EXAM:
CHEST  2 VIEW

[Series 1: dg chest 2 view · 0.14mm/px · 2 of 2 slices shown]
[im 1/2]
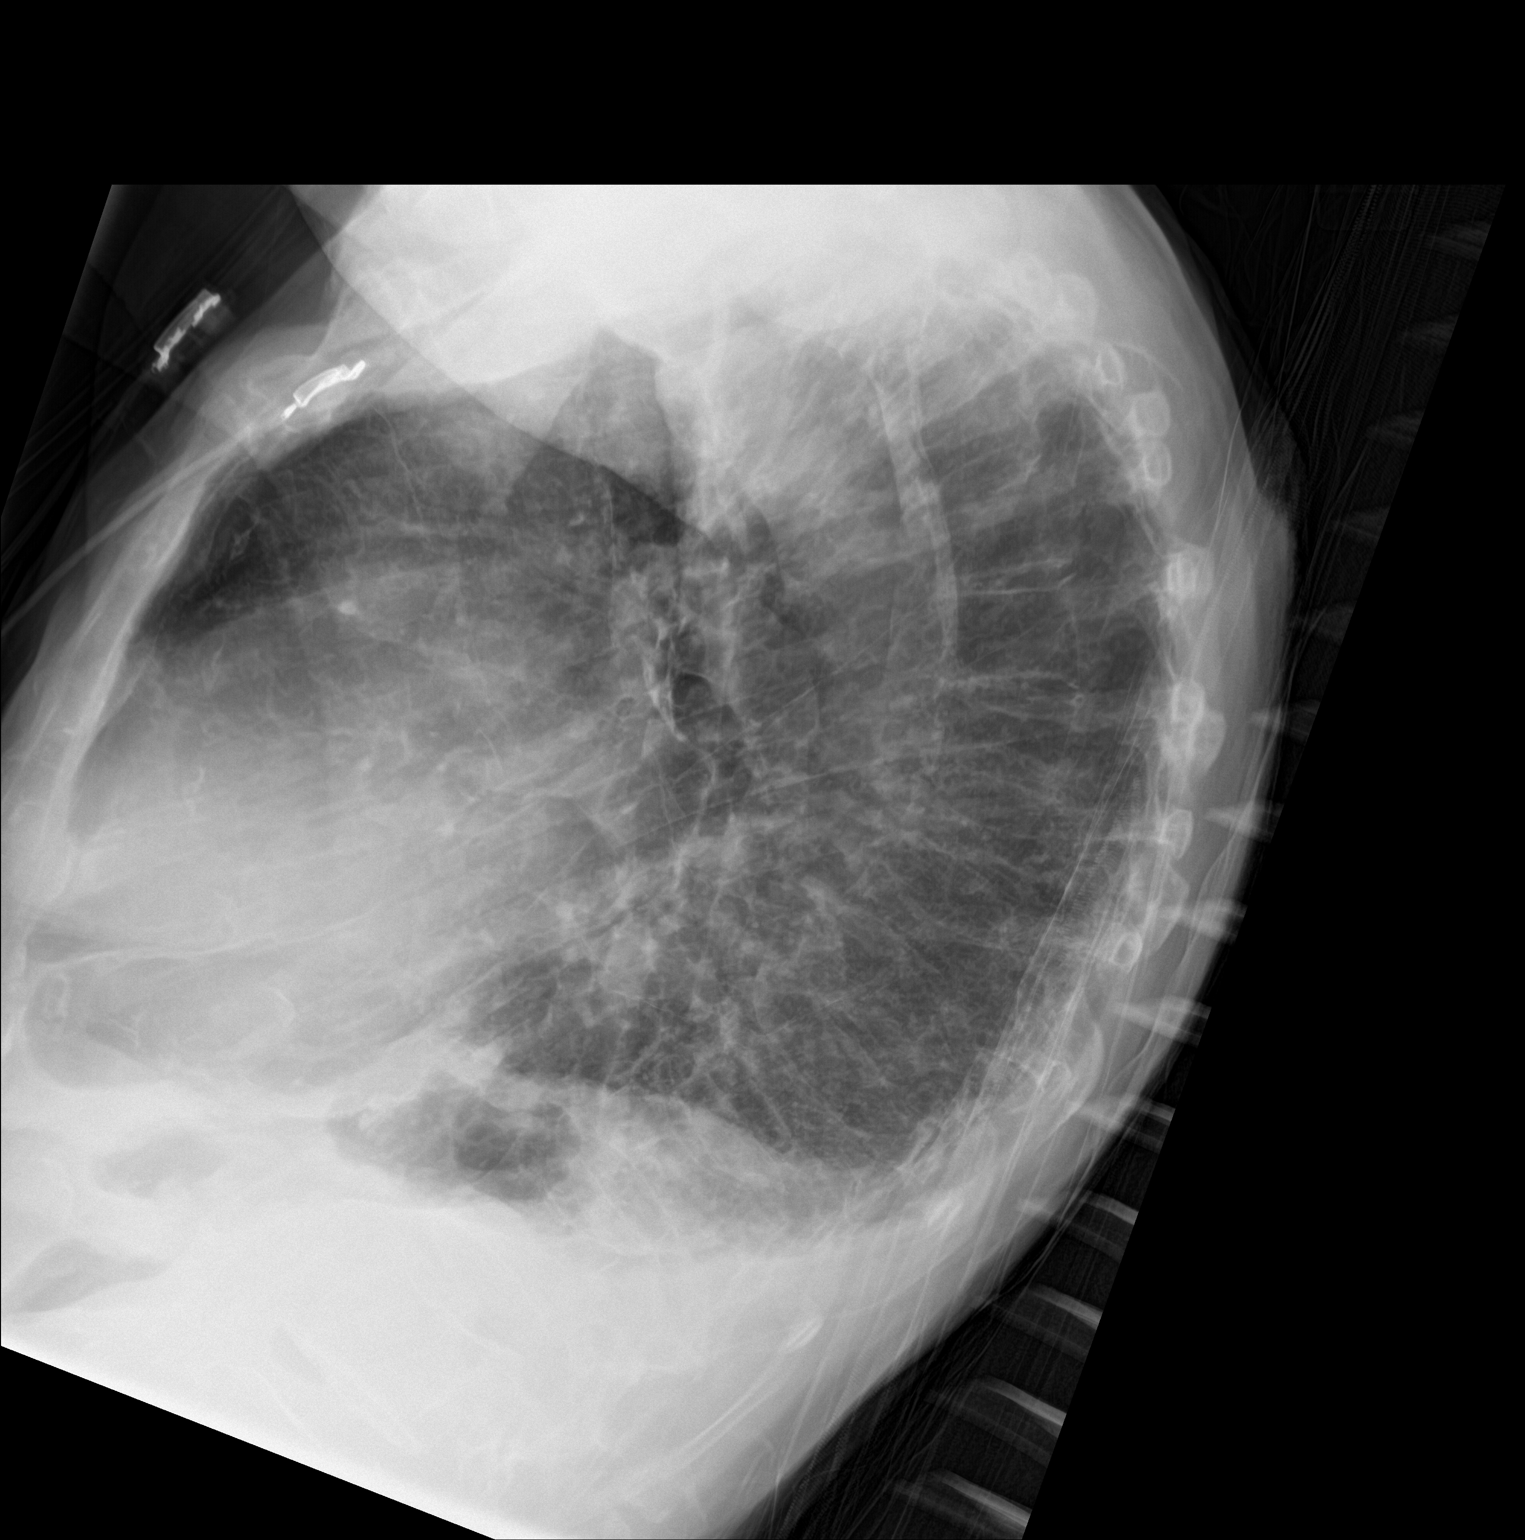
[im 2/2]
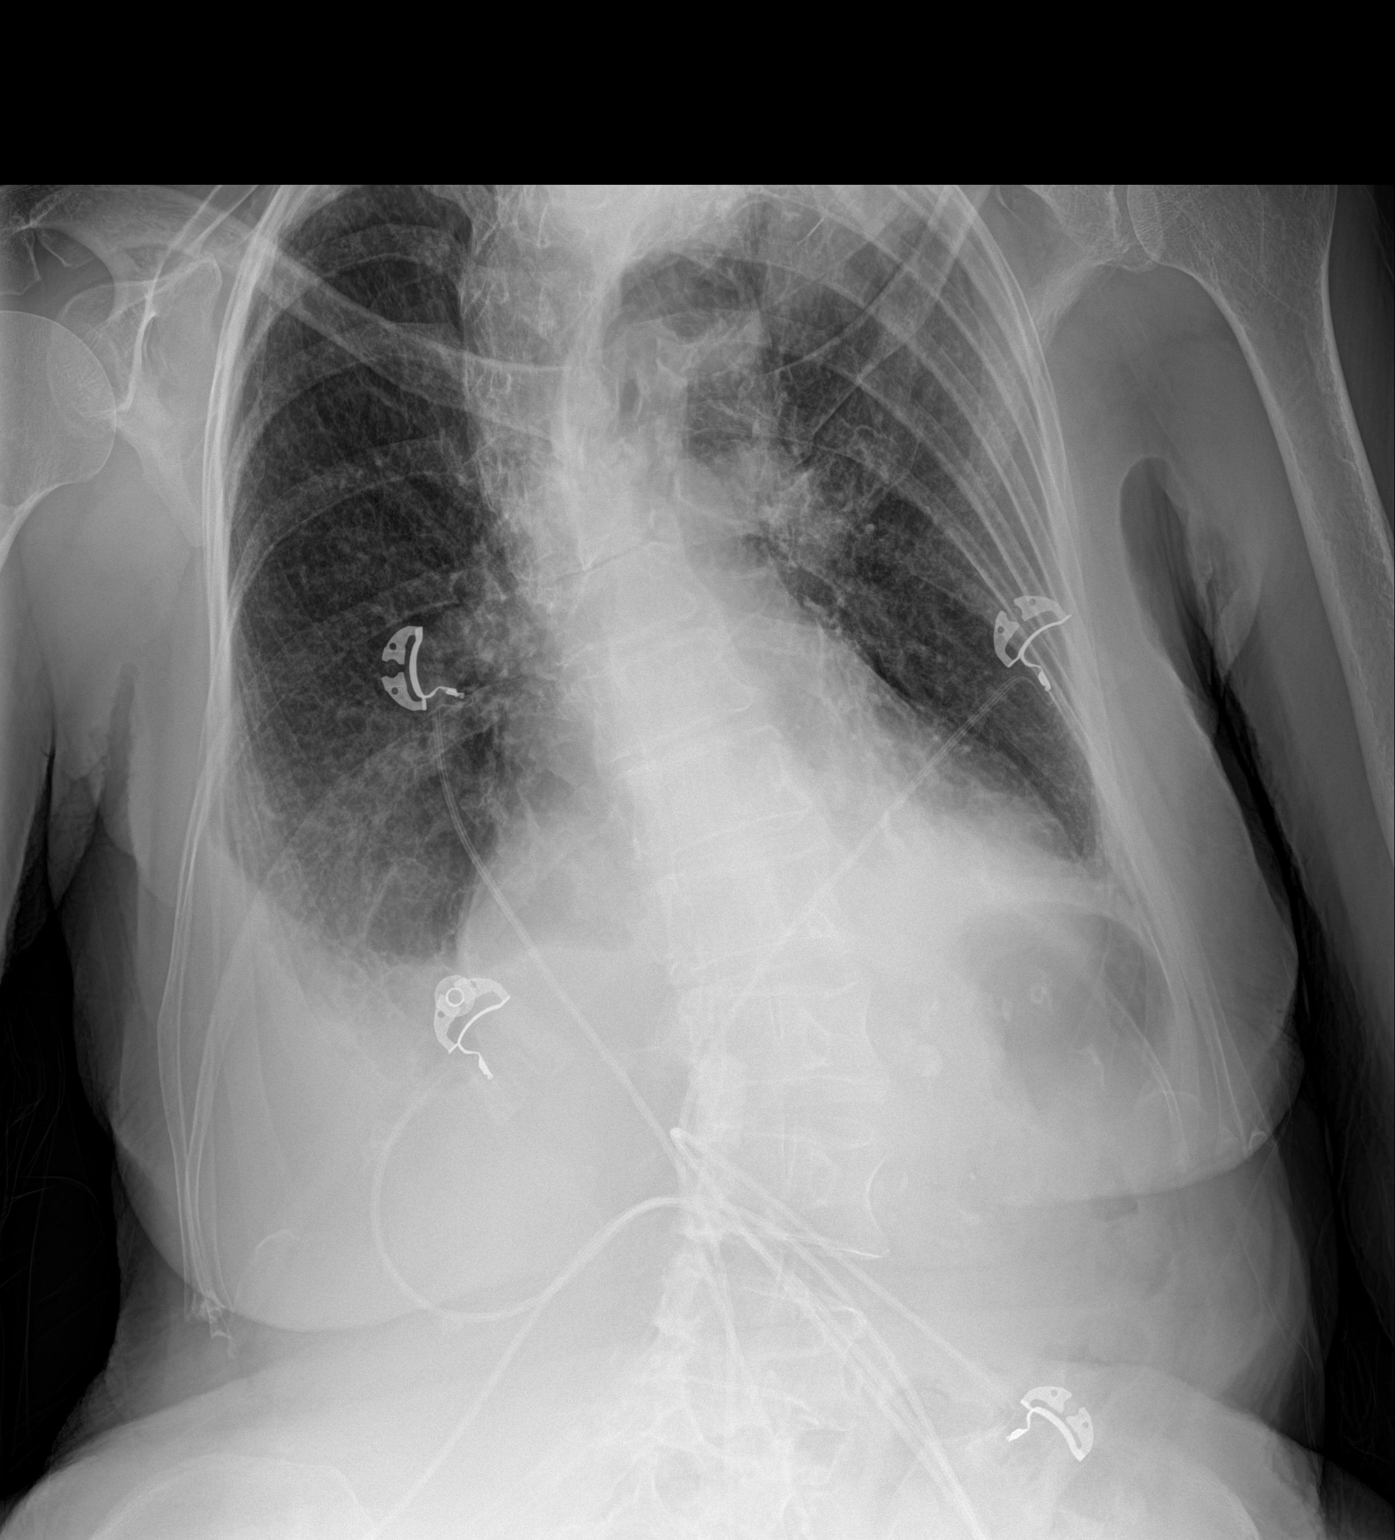

[2 of 2 positions shown; findings below may reference images not displayed]

FINDINGS: Cardiomediastinal silhouette is stable. No convincing pulmonary
edema. There is small right pleural effusion with right basilar
atelectasis or infiltrate. Trace left basilar atelectasis.
Osteopenia and degenerative changes thoracic spine.
IMPRESSION: Small right pleural effusion with right basilar atelectasis or
infiltrate. Trace left basilar atelectasis. No convincing pulmonary
edema. Osteopenia and degenerative changes thoracic spine.

## 2018-06-19 ENCOUNTER — Observation Stay
Admission: EM | Admit: 2018-06-19 | Discharge: 2018-06-23 | Disposition: A | Discharge status: Home Health | Payer: Medicare Other | Attending: Internal Medicine | Admitting: Internal Medicine

## 2018-06-19 ENCOUNTER — Emergency Department: Payer: Medicare Other

## 2018-06-19 ENCOUNTER — Other Ambulatory Visit: Payer: Self-pay

## 2018-06-19 DIAGNOSIS — Z882 Allergy status to sulfonamides status: Secondary | ICD-10-CM | POA: Diagnosis not present

## 2018-06-19 DIAGNOSIS — Z87891 Personal history of nicotine dependence: Secondary | ICD-10-CM | POA: Diagnosis not present

## 2018-06-19 DIAGNOSIS — Z881 Allergy status to other antibiotic agents status: Secondary | ICD-10-CM | POA: Diagnosis not present

## 2018-06-19 DIAGNOSIS — I739 Peripheral vascular disease, unspecified: Secondary | ICD-10-CM | POA: Diagnosis not present

## 2018-06-19 DIAGNOSIS — I480 Paroxysmal atrial fibrillation: Secondary | ICD-10-CM | POA: Insufficient documentation

## 2018-06-19 DIAGNOSIS — Z90710 Acquired absence of both cervix and uterus: Secondary | ICD-10-CM | POA: Diagnosis not present

## 2018-06-19 DIAGNOSIS — M7989 Other specified soft tissue disorders: Secondary | ICD-10-CM | POA: Diagnosis present

## 2018-06-19 DIAGNOSIS — G629 Polyneuropathy, unspecified: Secondary | ICD-10-CM | POA: Insufficient documentation

## 2018-06-19 DIAGNOSIS — K219 Gastro-esophageal reflux disease without esophagitis: Secondary | ICD-10-CM | POA: Insufficient documentation

## 2018-06-19 DIAGNOSIS — I5032 Chronic diastolic (congestive) heart failure: Secondary | ICD-10-CM | POA: Insufficient documentation

## 2018-06-19 DIAGNOSIS — Z7902 Long term (current) use of antithrombotics/antiplatelets: Secondary | ICD-10-CM | POA: Insufficient documentation

## 2018-06-19 DIAGNOSIS — L03116 Cellulitis of left lower limb: Principal | ICD-10-CM | POA: Insufficient documentation

## 2018-06-19 DIAGNOSIS — M810 Age-related osteoporosis without current pathological fracture: Secondary | ICD-10-CM | POA: Insufficient documentation

## 2018-06-19 DIAGNOSIS — I130 Hypertensive heart and chronic kidney disease with heart failure and stage 1 through stage 4 chronic kidney disease, or unspecified chronic kidney disease: Secondary | ICD-10-CM | POA: Diagnosis not present

## 2018-06-19 DIAGNOSIS — R0602 Shortness of breath: Secondary | ICD-10-CM

## 2018-06-19 DIAGNOSIS — Z885 Allergy status to narcotic agent status: Secondary | ICD-10-CM | POA: Diagnosis not present

## 2018-06-19 DIAGNOSIS — Z79899 Other long term (current) drug therapy: Secondary | ICD-10-CM | POA: Diagnosis not present

## 2018-06-19 DIAGNOSIS — Z7951 Long term (current) use of inhaled steroids: Secondary | ICD-10-CM | POA: Diagnosis not present

## 2018-06-19 LAB — CBC WITH DIFFERENTIAL/PLATELET
Basophils Absolute: 0.1 10*3/uL (ref 0–0.1)
Basophils Relative: 1 %
EOS ABS: 0.2 10*3/uL (ref 0–0.7)
Eosinophils Relative: 3 %
HCT: 42.1 % (ref 35.0–47.0)
HEMOGLOBIN: 14.3 g/dL (ref 12.0–16.0)
LYMPHS ABS: 1.5 10*3/uL (ref 1.0–3.6)
LYMPHS PCT: 22 %
MCH: 32.5 pg (ref 26.0–34.0)
MCHC: 34 g/dL (ref 32.0–36.0)
MCV: 95.6 fL (ref 80.0–100.0)
MONOS PCT: 9 %
Monocytes Absolute: 0.6 10*3/uL (ref 0.2–0.9)
Neutro Abs: 4.5 10*3/uL (ref 1.4–6.5)
Neutrophils Relative %: 65 %
Platelets: 163 10*3/uL (ref 150–440)
RBC: 4.4 MIL/uL (ref 3.80–5.20)
RDW: 14 % (ref 11.5–14.5)
WBC: 6.9 10*3/uL (ref 3.6–11.0)

## 2018-06-19 MED ORDER — CLINDAMYCIN PHOSPHATE 900 MG/50ML IV SOLN
900.0000 mg | Freq: Once | INTRAVENOUS | Status: AC
  Administered 2018-06-20: 900 mg via INTRAVENOUS
  Filled 2018-06-19: qty 50

## 2018-06-19 MED ORDER — FENTANYL CITRATE (PF) 100 MCG/2ML IJ SOLN
50.0000 ug | Freq: Once | INTRAMUSCULAR | Status: AC
  Administered 2018-06-19: 50 ug via INTRAVENOUS
  Filled 2018-06-19: qty 2

## 2018-06-19 MED ORDER — ENOXAPARIN SODIUM 60 MG/0.6ML ~~LOC~~ SOLN
1.0000 mg/kg | Freq: Once | SUBCUTANEOUS | Status: AC
Start: 2018-06-19 — End: 2018-06-20
  Administered 2018-06-20: 45 mg via SUBCUTANEOUS
  Filled 2018-06-19: qty 0.6

## 2018-06-19 MED ORDER — SODIUM CHLORIDE 0.9 % IV SOLN
1.0000 g | Freq: Once | INTRAVENOUS | Status: AC
  Administered 2018-06-19: 1 g via INTRAVENOUS
  Filled 2018-06-19: qty 10

## 2018-06-19 MED ORDER — IBUPROFEN 600 MG PO TABS
600.0000 mg | ORAL_TABLET | Freq: Once | ORAL | Status: DC
  Filled 2018-06-19: qty 1

## 2018-06-19 NOTE — ED Triage Notes (Signed)
Family reports left lower leg swelling that patient told them began yesterday and has gotten progressively worse.  Swelling and redness noted to left lower leg.

## 2018-06-19 NOTE — ED Notes (Signed)
ED Provider at bedside. 

## 2018-06-19 NOTE — ED Provider Notes (Signed)
[facility] Emergency Department Provider Note  ____________________________________________   First MD Initiated Contact with Patient 06/19/18 2259     (approximate)  I have reviewed the triage vital signs and the nursing notes.   HISTORY  Chief Complaint Leg Swelling   HPI [name] is a 84 y.o. female who is brought to the emergency department by her daughters with 1 day of sudden onset severe pain in her left lower extremity.  She has a complex past medical history including atrial fibrillation, peripheral artery disease, and a chronic wound to her left lower extremity.  She takes Plavix but no blood thinning medication.  She has a remote stent in her left lower extremity that according to the patient "failed".  She has chronic pain and swelling in that left leg however for the past 24 hours it is become acutely worse.  She has severe sharp stabbing pain with even the most minor of movements or touch.  She cannot even put a sheet on her leg without discomfort.  The pain is worse when walking minimally improved with rest and elevation.  No fevers or chills.  No new trauma.    Past Medical History:  Diagnosis Date  . Atrial fibrillation (HCC)   . Degenerative arthritis   . Degenerative joint disease   . Foot ulcer (HCC)    Chronic left ankle ulcer  . GERD (gastroesophageal reflux disease)   . Hypertension   . Osteoporosis   . PAD (peripheral artery disease) (HCC)    s/p bypass and left leg stents  . Peripheral neuropathy     Patient Active Problem List   Diagnosis Date Noted  . Cellulitis of left lower extremity 06/20/2018  . Chronic diastolic heart failure (HCC) 11/06/2015  . Bradycardia 11/06/2015  . Atrial fibrillation (HCC) 11/06/2015  . Lump of skin of lower extremity 11/06/2015  . Seasonal allergies 11/06/2015  . Pressure ulcer 10/24/2015    Past Surgical History:  Procedure Laterality Date  . ABDOMINAL HYSTERECTOMY    .  Femoral bypass surgery     left leg  . STENT PLACEMENT ILIAC ([facility] HX)     Left leg  . TONSILLECTOMY  age 40    Prior to Admission medications   Medication Sig Start Date End Date Taking? Authorizing Provider  acetaminophen (TYLENOL) 500 MG tablet Take 500 mg by mouth every 6 (six) hours as needed.   Yes [provider]  ALPRAZolam (XANAX) 0.25 MG tablet Take 0.25 mg by mouth at bedtime as needed for sleep.   Yes [provider]  cetirizine (ZYRTEC) 10 MG tablet Take 10 mg by mouth daily.   Yes [provider]  clopidogrel (PLAVIX) 75 MG tablet Take 75 mg by mouth daily.   Yes [provider]  digoxin (LANOXIN) 0.125 MG tablet Take 0.125 mg by mouth 4 (four) times a week. Sunday, Tuesday, Thursday, Saturday.   Yes [provider]  docusate sodium (COLACE) 100 MG capsule Take 100 mg by mouth 2 (two) times daily as needed for mild constipation.   Yes [provider]  ferrous sulfate 325 (65 FE) MG tablet TAKE 1 TABLET BY MOUTH EVERY three times weekly (Mondays, Wednesdays, Fridays) 04/13/18  Yes [provider]  fluticasone (FLONASE) 50 MCG/ACT nasal spray Place 2 sprays into both nostrils at bedtime.    Yes [provider]  furosemide (LASIX) 20 MG tablet Take 20-40 mg by mouth every other day. Pt alternates furosemide 20 mg and  furosemide 40 mg every other day.   Yes [provider]  HYDROcodone-acetaminophen (NORCO/VICODIN) 5-325 MG tablet Take 1 tablet by mouth 2 (two) times daily as needed for moderate pain.   Yes [provider]  losartan (COZAAR) 50 MG tablet Take 50 mg by mouth daily.   Yes [provider]  meclizine (ANTIVERT) 25 MG tablet Take 12.5 mg by mouth 2 (two) times daily as needed for dizziness.   Yes [provider]  metoprolol tartrate (LOPRESSOR) 25 MG tablet Take 25 mg by mouth 2 (two) times daily.    Yes [provider]  rOPINIRole (REQUIP) 2 MG tablet Take 2  mg by mouth at bedtime.   Yes [provider]  Vitamin D, Ergocalciferol, (DRISDOL) 50000 UNITS CAPS capsule Take 50,000 Units by mouth every 7 (seven) days. Pt takes on Tuesday.   Yes [provider]  cetirizine-pseudoephedrine (ZYRTEC-D) 5-120 MG tablet Take 1 tablet by mouth 2 (two) times daily as needed for allergies.    [provider]  furosemide (LASIX) 20 MG tablet Take 1 tablet (20 mg total) by mouth daily. Can use additional 20 mg PO lasix as need for worsening SOB, LEG SWELLING or more than 3 lbs wt gain in a week Patient not taking: Reported on 06/20/2018 10/25/15   Delfino LovettShah, Vipul, MD  gabapentin (NEURONTIN) 300 MG capsule Take 300 mg by mouth 2 (two) times daily.     [provider]  phenylephrine (SUDAFED PE) 10 MG TABS tablet Take 10 mg by mouth every 4 (four) hours as needed (for congestion).    [provider]  ranitidine (ZANTAC) 150 MG tablet Take 150 mg by mouth 2 (two) times daily as needed for heartburn.    [provider]  simvastatin (ZOCOR) 10 MG tablet Take 10 mg by mouth at bedtime.    [provider]    Allergies Alendronate; Doxycycline; Minocycline; Oxycodone; and Sulfa antibiotics  Family History  Problem Relation Age of Onset  . Peripheral vascular disease Father     Social History Social History   Tobacco Use  . Smoking status: Former Games developermoker  . Smokeless tobacco: Never Used  . Tobacco comment: Quit 10 years ago  Substance Use Topics  . Alcohol use: No    Alcohol/week: 0.0 oz  . Drug use: Not on file    Review of Systems Constitutional: No fever/chills Eyes: No visual changes. ENT: No sore throat. Cardiovascular: Denies chest pain. Respiratory: Denies shortness of breath. Gastrointestinal: No abdominal pain.  No nausea, no vomiting.  No diarrhea.  No constipation. Genitourinary: Negative for dysuria. Musculoskeletal: Positive for leg Skin: Positive for rash. Neurological: Negative for  headaches, focal weakness or numbness.   ____________________________________________   PHYSICAL EXAM:  VITAL SIGNS: ED Triage Vitals  Enc Vitals Group     BP 06/19/18 2205 131/64     Pulse Rate 06/19/18 2205 64     Resp 06/19/18 2205 18     Temp 06/19/18 2205 98.4 F (36.9 C)     Temp Source 06/19/18 2205 Oral     SpO2 06/19/18 2205 95 %     Weight 06/19/18 2202 99 lb (44.9 kg)     Height 06/19/18 2202 4\' 11"  (1.499 m)     Head Circumference --      Peak Flow --      Pain Score 06/19/18 2238 10     Pain Loc --      Pain Edu? --  Excl. in GC? --     Constitutional: Appears miserable crying out in pain with even the most minimal touch to her left leg Eyes: PERRL EOMI. Head: Atraumatic. Nose: No congestion/rhinnorhea. Mouth/Throat: No trismus Neck: No stridor.   Cardiovascular: Normal rate, regular rhythm. Grossly normal heart sounds.  Good peripheral circulation. Respiratory: Normal respiratory effort.  No retractions. Lungs CTAB and moving good air Gastrointestinal: Soft nontender Musculoskeletal: Left leg swollen and erythematous no pitting edema Neurologic:  Normal speech and language. No gross focal neurologic deficits are appreciated. Skin: Left lower extremity erythematous.  No bulla blisters or sloughing.  No crepitus appreciated.  Delayed capillary refill in the left lower extremity although about 4 seconds.  I do get a biphasic PT signal Psychiatric: Mood and affect are normal. Speech and behavior are normal.    ____________________________________________   DIFFERENTIAL includes but not limited to  DVT, arterial occlusion, necrotizing soft tissue infection, complex regional pain syndrome ____________________________________________   LABS (all labs ordered are listed, but only abnormal results are displayed)  Labs Reviewed  COMPREHENSIVE METABOLIC PANEL - Abnormal; Notable for the following components:      Result Value   Creatinine, Ser 1.06 (*)     GFR calc non Af Amer 45 (*)    GFR calc Af Amer 52 (*)    All other components within normal limits  TROPONIN I - Abnormal; Notable for the following components:   Troponin I 0.03 (*)    All other components within normal limits  C-REACTIVE PROTEIN - Abnormal; Notable for the following components:   CRP 4.0 (*)    All other components within normal limits  CULTURE, BLOOD (ROUTINE X 2)  CULTURE, BLOOD (ROUTINE X 2)  LACTIC ACID, PLASMA  CBC WITH DIFFERENTIAL/PLATELET  PROCALCITONIN  PROTIME-INR  APTT  CK    Lab work reviewed by me with negative inflammatory markers __________________________________________  EKG    ____________________________________________  RADIOLOGY  Ultrasound left lower extremity reviewed by me with no evidence of DVT ____________________________________________   PROCEDURES  Procedure(s) performed: no  Procedures  Critical Care performed: no  ____________________________________________   INITIAL IMPRESSION / ASSESSMENT AND PLAN / ED COURSE  Pertinent labs & imaging results that were available during my care of the patient were reviewed by me and considered in my medical decision making (see chart for details).   On arrival the patient is exquisitely uncomfortable appearing with pain out of proportion to her left lower extremity.  Differential includes DVT versus arterial occlusion versus infection.  I am able to get a PT signal that is biphasic so if this is claudication it is not a critical occlusion at this point.  I will begin with 50 mcg of IV fentanyl for pain control as well as an ultrasound of her left lower extremity to evaluate for DVT.  I will also give her a first dose of Lovenox to cover her given my high clinical suspicion for arterial versus venous occlusion.  I spoke with the ultrasound technician Tammy and unfortunately we are unable to perform arterial ultrasounds overnight.  Given the pain out of proportion all also cover  her with triple antibiotics and check a lactate and blood cultures in case this could represent an early necrotizing soft tissue infection.  Regardless of the results the patient will require inpatient admission for continued IV pain control, IV antibiotics, and management of her complicated leg.      ____________________________________________   FINAL CLINICAL IMPRESSION(S) / ED DIAGNOSES  Final  diagnoses:  Leg swelling  Claudication (HCC)      NEW MEDICATIONS STARTED DURING THIS VISIT:  Current Discharge Medication List       Note:  This document was prepared using Dragon voice recognition software and may include unintentional dictation errors.     Merrily Brittle, MD 06/21/18 [phone]

## 2018-06-20 ENCOUNTER — Emergency Department: Payer: Medicare Other

## 2018-06-20 ENCOUNTER — Encounter: Payer: Self-pay | Admitting: Internal Medicine

## 2018-06-20 DIAGNOSIS — L03116 Cellulitis of left lower limb: Secondary | ICD-10-CM | POA: Diagnosis present

## 2018-06-20 LAB — COMPREHENSIVE METABOLIC PANEL
ALBUMIN: 4.3 g/dL (ref 3.5–5.0)
ALT: 18 U/L (ref 0–44)
ANION GAP: 11 (ref 5–15)
AST: 36 U/L (ref 15–41)
Alkaline Phosphatase: 83 U/L (ref 38–126)
BUN: 18 mg/dL (ref 8–23)
CALCIUM: 9.6 mg/dL (ref 8.9–10.3)
CO2: 25 mmol/L (ref 22–32)
Chloride: 105 mmol/L (ref 98–111)
Creatinine, Ser: 1.06 mg/dL — ABNORMAL HIGH (ref 0.44–1.00)
GFR calc Af Amer: 52 mL/min — ABNORMAL LOW (ref >60)
GFR calc non Af Amer: 45 mL/min — ABNORMAL LOW (ref >60)
Glucose, Bld: 79 mg/dL (ref 70–99)
Potassium: 5 mmol/L (ref 3.5–5.1)
Sodium: 141 mmol/L (ref 135–145)
Total Bilirubin: 1.1 mg/dL (ref 0.3–1.2)
Total Protein: 7.4 g/dL (ref 6.5–8.1)

## 2018-06-20 LAB — C-REACTIVE PROTEIN: CRP: 4 mg/dL — AB (ref <1.0)

## 2018-06-20 LAB — LACTIC ACID, PLASMA: Lactic Acid, Venous: 1.6 mmol/L (ref 0.5–1.9)

## 2018-06-20 LAB — TROPONIN I: Troponin I: 0.03 ng/mL (ref <0.03)

## 2018-06-20 LAB — CK: CK TOTAL: 106 U/L (ref 38–234)

## 2018-06-20 LAB — APTT: APTT: 31 s (ref 24–36)

## 2018-06-20 LAB — PROCALCITONIN

## 2018-06-20 LAB — PROTIME-INR
INR: 1.14
Prothrombin Time: 14.5 seconds (ref 11.4–15.2)

## 2018-06-20 MED ORDER — ONDANSETRON HCL 4 MG/2ML IJ SOLN: 4.0000 mg | Freq: Four times a day (QID) | INTRAMUSCULAR | Status: DC | PRN

## 2018-06-20 MED ORDER — GABAPENTIN 300 MG PO CAPS
300.0000 mg | ORAL_CAPSULE | Freq: Two times a day (BID) | ORAL | Status: DC
  Filled 2018-06-20 (×5): qty 1

## 2018-06-20 MED ORDER — ACETAMINOPHEN 325 MG PO TABS
650.0000 mg | ORAL_TABLET | Freq: Four times a day (QID) | ORAL | Status: DC | PRN
  Administered 2018-06-20 – 2018-06-23 (×6): 650 mg via ORAL
  Filled 2018-06-20 (×6): qty 2

## 2018-06-20 MED ORDER — VANCOMYCIN HCL 500 MG IV SOLR
500.0000 mg | Freq: Once | INTRAVENOUS | Status: AC
  Administered 2018-06-20: 500 mg via INTRAVENOUS
  Filled 2018-06-20: qty 500

## 2018-06-20 MED ORDER — FENTANYL CITRATE (PF) 100 MCG/2ML IJ SOLN
50.0000 ug | Freq: Once | INTRAMUSCULAR | Status: AC
  Administered 2018-06-20: 50 ug via INTRAVENOUS

## 2018-06-20 MED ORDER — BISACODYL 5 MG PO TBEC: 5.0000 mg | DELAYED_RELEASE_TABLET | Freq: Every day | ORAL | Status: DC | PRN

## 2018-06-20 MED ORDER — SIMVASTATIN 20 MG PO TABS
10.0000 mg | ORAL_TABLET | Freq: Every day | ORAL | Status: DC
  Filled 2018-06-20 (×2): qty 1

## 2018-06-20 MED ORDER — FAMOTIDINE 20 MG PO TABS: 20.0000 mg | ORAL_TABLET | Freq: Two times a day (BID) | ORAL | Status: DC | PRN

## 2018-06-20 MED ORDER — HYDROMORPHONE HCL 1 MG/ML IJ SOLN
0.5000 mg | INTRAMUSCULAR | Status: DC | PRN
Start: 2018-06-20 — End: 2018-06-23
  Administered 2018-06-20: 1 mg via INTRAVENOUS
  Filled 2018-06-20: qty 1

## 2018-06-20 MED ORDER — SODIUM CHLORIDE 0.9 % IV SOLN
1.0000 g | INTRAVENOUS | Status: DC
  Administered 2018-06-20 – 2018-06-22 (×3): 1 g via INTRAVENOUS
  Filled 2018-06-20: qty 10
  Filled 2018-06-20 (×3): qty 1

## 2018-06-20 MED ORDER — DIGOXIN 125 MCG PO TABS
0.1250 mg | ORAL_TABLET | ORAL | Status: DC
  Administered 2018-06-20 – 2018-06-23 (×3): 0.125 mg via ORAL
  Filled 2018-06-20 (×3): qty 1

## 2018-06-20 MED ORDER — MECLIZINE HCL 12.5 MG PO TABS
12.5000 mg | ORAL_TABLET | Freq: Two times a day (BID) | ORAL | Status: DC | PRN
  Filled 2018-06-20: qty 1

## 2018-06-20 MED ORDER — METOPROLOL TARTRATE 25 MG PO TABS
25.0000 mg | ORAL_TABLET | Freq: Two times a day (BID) | ORAL | Status: DC
  Administered 2018-06-20 – 2018-06-23 (×6): 25 mg via ORAL
  Filled 2018-06-20 (×7): qty 1

## 2018-06-20 MED ORDER — ACETAMINOPHEN 650 MG RE SUPP: 650.0000 mg | Freq: Four times a day (QID) | RECTAL | Status: DC | PRN

## 2018-06-20 MED ORDER — SENNOSIDES-DOCUSATE SODIUM 8.6-50 MG PO TABS: 1.0000 | ORAL_TABLET | Freq: Every evening | ORAL | Status: DC | PRN

## 2018-06-20 MED ORDER — LOSARTAN POTASSIUM 50 MG PO TABS
50.0000 mg | ORAL_TABLET | Freq: Every day | ORAL | Status: DC
  Administered 2018-06-20 – 2018-06-23 (×4): 50 mg via ORAL
  Filled 2018-06-20 (×4): qty 1

## 2018-06-20 MED ORDER — ONDANSETRON HCL 4 MG PO TABS: 4.0000 mg | ORAL_TABLET | Freq: Four times a day (QID) | ORAL | Status: DC | PRN

## 2018-06-20 MED ORDER — CLOPIDOGREL BISULFATE 75 MG PO TABS
75.0000 mg | ORAL_TABLET | Freq: Every day | ORAL | Status: DC
  Administered 2018-06-20 – 2018-06-23 (×4): 75 mg via ORAL
  Filled 2018-06-20 (×4): qty 1

## 2018-06-20 MED ORDER — ROPINIROLE HCL 1 MG PO TABS
2.0000 mg | ORAL_TABLET | Freq: Every day | ORAL | Status: DC
  Administered 2018-06-20 – 2018-06-22 (×3): 2 mg via ORAL
  Filled 2018-06-20 (×3): qty 2

## 2018-06-20 MED ORDER — FUROSEMIDE 20 MG PO TABS
20.0000 mg | ORAL_TABLET | Freq: Every day | ORAL | Status: DC
  Administered 2018-06-20 – 2018-06-23 (×4): 20 mg via ORAL
  Filled 2018-06-20 (×4): qty 1

## 2018-06-20 MED ORDER — CLINDAMYCIN PHOSPHATE 600 MG/50ML IV SOLN
600.0000 mg | Freq: Three times a day (TID) | INTRAVENOUS | Status: DC
  Administered 2018-06-20 – 2018-06-23 (×10): 600 mg via INTRAVENOUS
  Filled 2018-06-20 (×12): qty 50

## 2018-06-20 NOTE — H&P (Signed)
[facility] [place] at [facility]   PATIENT NAME: [name]    MR#:  [id number]  DATE OF BIRTH:  [birth year]  DATE OF ADMISSION:  06/19/2018  PRIMARY CARE PHYSICIAN: Barbette Reichmann, MD   REQUESTING/REFERRING PHYSICIAN:  Merrily Brittle, MD   CHIEF COMPLAINT:   Chief Complaint  Patient presents with  . Leg Swelling    HISTORY OF PRESENT ILLNESS:  [name]  is a 84 y.o. female with a known history of AF (no systemic AC), PVD (s/p LLE stents + fem-pop bypass) p/w 1-2d Hx LLE swelling/erythema/pain. Of note, pt has a known long-standing Hx (~45yrs) of L lateral malleolar ulcer, w/ poor healing 2/2 vascular disease. She has persistent pain in the LLE at baseline, also 2/2 vascular disease. Additionally, pt has, at baseline, a small seroma at the base of the fem-pop bypass incision on the L medial calf/knee. At the time of my exam, she is sleeping soundly after having received pain medication. Hx is obtained from pt's daughter (though *this* daughter tells me that her sister, pt's *other* daughter, is DPOA). At baseline, pt has poor ambulation, as well as hearing and vision loss, but does not have dementia.  Pt's daughter states that pt had complained of swelling of LLE on Thursday 07/18, but did not report any worsening of the persistent/chronic pain she has in the LLE at baseline. On Friday 07/19, pt reported worsening of the swelling. Her daughters went to see her @~2000PM. At that time, she told them her LLE was hurting badly. They examined it to find erythema and edema up to the knee. She was brought to the hospital. She is resting comfortably. Afebrile, (-) leukocytosis, SIRS (-). Lactate 1.6. Pt noted to have pain out of proportion to physical examination findings, concerning for vascular disease, especially given pt's Hx. In addition to Tx w/ ABx for cellulitis, given the aforementioned concern, pt has also received a dose of therapeutic Lovenox, is now  awaiting LLE arterial doppler. Pt take Plavix. Pt's Vascular Surgeon is Dr. Karie Mainland [facility]  PAST MEDICAL HISTORY:   Past Medical History:  Diagnosis Date  . Atrial fibrillation (HCC)   . Degenerative arthritis   . Degenerative joint disease   . Foot ulcer (HCC)    Chronic left ankle ulcer  . GERD (gastroesophageal reflux disease)   . Hypertension   . Osteoporosis   . PAD (peripheral artery disease) (HCC)    s/p bypass and left leg stents  . Peripheral neuropathy     PAST SURGICAL HISTORY:   Past Surgical History:  Procedure Laterality Date  . ABDOMINAL HYSTERECTOMY    . Femoral bypass surgery     left leg  . STENT PLACEMENT ILIAC ([facility] HX)     Left leg  . TONSILLECTOMY  age 83    SOCIAL HISTORY:   Social History   Tobacco Use  . Smoking status: Former Games developer  . Smokeless tobacco: Never Used  . Tobacco comment: Quit 10 years ago  Substance Use Topics  . Alcohol use: No    Alcohol/week: 0.0 oz    FAMILY HISTORY:   Family History  Problem Relation Age of Onset  . Peripheral vascular disease Father     DRUG ALLERGIES:   Allergies  Allergen Reactions  . Alendronate Other (See Comments)    Reaction:  Unknown   . Doxycycline Nausea And Vomiting  . Minocycline Nausea And Vomiting  . Oxycodone Other (See Comments)    Reaction:  Hallucinations   .  Sulfa Antibiotics Nausea And Vomiting    REVIEW OF SYSTEMS:   Review of Systems  Constitutional: Negative for chills, diaphoresis, fever, malaise/fatigue and weight loss.  HENT: Negative for congestion, ear pain, hearing loss, nosebleeds, sinus pain, sore throat and tinnitus.   Eyes: Negative for blurred vision, double vision and photophobia.  Respiratory: Negative for cough, hemoptysis, sputum production, shortness of breath and wheezing.   Cardiovascular: Positive for leg swelling. Negative for chest pain, palpitations, orthopnea, claudication and PND.  Gastrointestinal: Negative for abdominal pain, blood in  stool, constipation, diarrhea, heartburn, melena, nausea and vomiting.  Genitourinary: Negative for dysuria, frequency, hematuria and urgency.  Musculoskeletal: Negative for back pain, joint pain, myalgias and neck pain.  Skin: Negative for itching and rash.  Neurological: Negative for dizziness, tingling, tremors, sensory change, speech change, focal weakness, seizures, loss of consciousness, weakness and headaches.  Psychiatric/Behavioral: Negative for memory loss. The patient does not have insomnia.    (+) LLE swelling/redness/pain. MEDICATIONS AT HOME:   Prior to Admission medications   Medication Sig Start Date End Date Taking? Authorizing Provider  acetaminophen (TYLENOL) 500 MG tablet Take 500 mg by mouth every 6 (six) hours as needed.   Yes [provider]  ALPRAZolam (XANAX) 0.25 MG tablet Take 0.25 mg by mouth at bedtime as needed for sleep.   Yes [provider]  cetirizine (ZYRTEC) 10 MG tablet Take 10 mg by mouth daily.   Yes [provider]  clopidogrel (PLAVIX) 75 MG tablet Take 75 mg by mouth daily.   Yes [provider]  digoxin (LANOXIN) 0.125 MG tablet Take 0.125 mg by mouth 4 (four) times a week. Sunday, Tuesday, Thursday, Saturday.   Yes [provider]  docusate sodium (COLACE) 100 MG capsule Take 100 mg by mouth 2 (two) times daily as needed for mild constipation.   Yes [provider]  ferrous sulfate 325 (65 FE) MG tablet TAKE 1 TABLET BY MOUTH EVERY three times weekly (Mondays, Wednesdays, Fridays) 04/13/18  Yes [provider]  fluticasone (FLONASE) 50 MCG/ACT nasal spray Place 2 sprays into both nostrils at bedtime.    Yes [provider]  furosemide (LASIX) 20 MG tablet Take 20-40 mg by mouth every other day. Pt alternates furosemide 20 mg and furosemide 40 mg every other day.   Yes [provider]  HYDROcodone-acetaminophen (NORCO/VICODIN) 5-325 MG tablet Take 1 tablet by mouth 2 (two)  times daily as needed for moderate pain.   Yes [provider]  losartan (COZAAR) 50 MG tablet Take 50 mg by mouth daily.   Yes [provider]  meclizine (ANTIVERT) 25 MG tablet Take 12.5 mg by mouth 2 (two) times daily as needed for dizziness.   Yes [provider]  metoprolol tartrate (LOPRESSOR) 25 MG tablet Take 25 mg by mouth 2 (two) times daily.    Yes [provider]  rOPINIRole (REQUIP) 2 MG tablet Take 2 mg by mouth at bedtime.   Yes [provider]  Vitamin D, Ergocalciferol, (DRISDOL) 50000 UNITS CAPS capsule Take 50,000 Units by mouth every 7 (seven) days. Pt takes on Tuesday.   Yes [provider]  cetirizine-pseudoephedrine (ZYRTEC-D) 5-120 MG tablet Take 1 tablet by mouth 2 (two) times daily as needed for allergies.    [provider]  furosemide (LASIX) 20 MG tablet Take 1 tablet (20 mg total) by mouth daily. Can use additional 20 mg PO lasix as need for worsening SOB, LEG SWELLING or more than 3 lbs  wt gain in a week Patient not taking: Reported on 06/20/2018 10/25/15   Delfino Lovett, MD  gabapentin (NEURONTIN) 300 MG capsule Take 300 mg by mouth 2 (two) times daily.     [provider]  phenylephrine (SUDAFED PE) 10 MG TABS tablet Take 10 mg by mouth every 4 (four) hours as needed (for congestion).    [provider]  ranitidine (ZANTAC) 150 MG tablet Take 150 mg by mouth 2 (two) times daily as needed for heartburn.    [provider]  simvastatin (ZOCOR) 10 MG tablet Take 10 mg by mouth at bedtime.    [provider]      VITAL SIGNS:  Blood pressure (!) 103/55, pulse 63, temperature 98.4 F (36.9 C), temperature source Oral, resp. rate 19, height 4\' 11"  (1.499 m), weight 44.9 kg (99 lb), SpO2 94 %.  PHYSICAL EXAMINATION:  Physical Exam  Constitutional: She appears well-developed and well-nourished. She is sleeping.  Non-toxic appearance. She does not have a sickly appearance. She  does not appear ill. No distress. She is not intubated.  HENT:  Head: Normocephalic and atraumatic.  Mouth/Throat: Oropharynx is clear and moist. No oropharyngeal exudate.  Eyes: Conjunctivae, EOM and lids are normal. No scleral icterus.  Neck: Neck supple. No JVD present. No thyromegaly present.  Cardiovascular: Normal rate, S1 normal, S2 normal and normal heart sounds. An irregularly irregular rhythm present. Exam reveals no gallop, no S3, no S4, no distant heart sounds and no friction rub.  No murmur heard. Pulmonary/Chest: Effort normal and breath sounds normal. No accessory muscle usage or stridor. No apnea, no tachypnea and no bradypnea. She is not intubated. No respiratory distress. She has no decreased breath sounds. She has no wheezes. She has no rhonchi. She has no rales.  Abdominal: Soft. Bowel sounds are normal. She exhibits no distension. There is no tenderness. There is no rebound and no guarding.  Musculoskeletal: Normal range of motion. She exhibits edema (+) LLE edema/erythema/TTP distal to L knee, tenderness (+) LLE edema/erythema/TTP distal to L knee and deformity (+) shallow L lateral malleolar ulcer.  Feet:  Left Foot:  Skin Integrity: Positive for ulcer, skin breakdown, erythema and warmth.  Lymphadenopathy:    She has no cervical adenopathy.  Neurological:  Asleep.  Skin: Skin is warm and dry. Rash (+) LLE edema/erythema/TTP distal to L knee noted. She is not diaphoretic. There is erythema (+) LLE edema/erythema/TTP distal to L knee.  Psychiatric: She has a normal mood and affect. Her behavior is normal.  Asleep.   LABORATORY PANEL:   CBC Recent Labs  Lab 06/19/18 2321  WBC 6.9  HGB 14.3  HCT 42.1  PLT 163   ------------------------------------------------------------------------------------------------------------------  Chemistries  Recent Labs  Lab 06/19/18 2321  NA 141  K 5.0  CL 105  CO2 25  GLUCOSE 79  BUN 18  CREATININE 1.06*  CALCIUM 9.6    AST 36  ALT 18  ALKPHOS 83  BILITOT 1.1   ------------------------------------------------------------------------------------------------------------------  Cardiac Enzymes Recent Labs  Lab 06/19/18 2321  TROPONINI 0.03*   ------------------------------------------------------------------------------------------------------------------  RADIOLOGY:  US Venous Img Lower Unilateral Left  Result Date: 06/20/2018 CLINICAL DATA:  84 year old female with left leg swelling and redness. EXAM: Left LOWER EXTREMITY VENOUS DOPPLER ULTRASOUND TECHNIQUE: Gray-scale sonography with graded compression, as well as color Doppler and duplex ultrasound were performed to evaluate the lower extremity deep venous systems from the level of the common femoral vein and including the common femoral, femoral, profunda femoral, popliteal  and calf veins including the posterior tibial, peroneal and gastrocnemius veins when visible. The superficial great saphenous vein was also interrogated. Spectral Doppler was utilized to evaluate flow at rest and with distal augmentation maneuvers in the common femoral, femoral and popliteal veins. COMPARISON:  None. FINDINGS: Contralateral Common Femoral Vein: Respiratory phasicity is normal and symmetric with the symptomatic side. No evidence of thrombus. Normal compressibility. Common Femoral Vein: No evidence of thrombus. Normal compressibility, respiratory phasicity and response to augmentation. Saphenofemoral Junction: No evidence of thrombus. Normal compressibility and flow on color Doppler imaging. Profunda Femoral Vein: No evidence of thrombus. Normal compressibility and flow on color Doppler imaging. Femoral Vein: No evidence of thrombus. Normal compressibility, respiratory phasicity and response to augmentation. Popliteal Vein: No evidence of thrombus. Normal compressibility, respiratory phasicity and response to augmentation. Calf Veins: No evidence of thrombus. Normal  compressibility and flow on color Doppler imaging. Superficial Great Saphenous Vein: No evidence of thrombus. Normal compressibility. Venous Reflux:  None. Other Findings: There is a 6.6 x 5.4 x 5.8 cm cystic structure with floating low-level echogenic debris anterior to the posterior tibial vein. As per family this has been present for years. IMPRESSION: No evidence of deep venous thrombosis. Minimally complex chronic fluid collection in the lateral leg. Electronically Signed   By: Elgie Collard M.D.   On: 06/20/2018 01:31   IMPRESSION AND PLAN:   A/P: 49F LLE erythema/edema/pain/TTP, cellulitis +/- arterial disease. SIRS (-). -LLE (+) cellulitis -Venous duplex U/S LLE (-) DVT -Arterial doppler LLE pending -Therapeutic Lovenox -Symptomatic mgmt, pain ctrl -Obs Med/Surg -Ceftriaxone + Clindamycin -c/w Plavix + Statin -Baseline CKD III (2/2 HTN, aged kidney), Cr presently @ baseline -c/w home meds -Cardiac diet -Lovenox -Full code -Observation, < 2 midnights   All the records are reviewed and case discussed with ED provider. Management plans discussed with the patient, family and they are in agreement.  CODE STATUS: Full code  TOTAL TIME TAKING CARE OF THIS PATIENT: 75 minutes.    Barbaraann Rondo M.D on 06/20/2018 at 2:34 AM  Between 7am to 6pm - Pager - [phone]  After 6pm go to [email or web address] - Social research officer, government  [facility]  Office  [phone]  CC: Primary care physician; Barbette Reichmann, MD   Note: This dictation was prepared with Dragon dictation along with smaller phrase technology. Any transcriptional errors that result from this process are unintentional.

## 2018-06-20 NOTE — Progress Notes (Signed)
Patient is here for left leg cellulitis, getting IV antibiotics.  Blood work is largely normal vitals stable the patient has redness of the left leg all the way up to left knee.  Continue IV antibiotics today, likely discharge home with home health with oral antibiotics tomorrow.  Discussed with patient's daughter.  Discussed about lab results, diagnosis.  Continue present treatment. Time spent 25 minutes.

## 2018-06-20 NOTE — Progress Notes (Signed)
Pharmacy Antibiotic Note  Georgeann OppenheimGenelle T Lessner is a 84 y.o. female admitted on 06/19/2018 with cellulitis.  Pharmacy has been consulted for Vancomycin dosing. Patient is also ordered Ceftriaxone and Clindamycin.  Plan: Vancomycin 500mg  IV times one dose given in ED. On admission patient to be continued on Ceftriaxone and Clindamycin.  Height: 5\' 2"  (157.5 cm) Weight: 99 lb (44.9 kg) IBW/kg (Calculated) : 50.1  Temp (24hrs), Avg:97.8 F (36.6 C), Min:97.2 F (36.2 C), Max:98.4 F (36.9 C)  Recent Labs  Lab 06/19/18 2321 06/19/18 2341  WBC 6.9  --   CREATININE 1.06*  --   LATICACIDVEN  --  1.6    Estimated Creatinine Clearance: 25 mL/min (A) (by C-G formula based on SCr of 1.06 mg/dL (H)).    Allergies  Allergen Reactions  . Alendronate Other (See Comments)    Reaction:  Unknown   . Doxycycline Nausea And Vomiting  . Minocycline Nausea And Vomiting  . Oxycodone Other (See Comments)    Reaction:  Hallucinations   . Sulfa Antibiotics Nausea And Vomiting    thank you for allowing pharmacy to be a part of this patient's care.  Clovia CuffLisa Jourden Delmont, PharmD, BCPS 06/20/2018 3:49 AM

## 2018-06-20 NOTE — Care Management Obs Status (Signed)
MEDICARE OBSERVATION STATUS NOTIFICATION   Patient Details  Name: [name] MRN: [id number] Date of Birth: 08/26/1925   Medicare Observation Status Notification Given:  Yes    Rene Sizelove A Piero Mustard, RN 06/20/2018, 10:00 AM

## 2018-06-20 NOTE — Plan of Care (Signed)
  Problem: Clinical Measurements: Goal: Ability to avoid or minimize complications of infection will improve Outcome: Progressing   Problem: Skin Integrity: Goal: Skin integrity will improve Outcome: Progressing   Problem: Coping: Goal: Ability to verbalize feelings will improve by discharge Outcome: Progressing Goal: Family members realistic understanding of the patients condition will improve by discharge Outcome: Progressing   Problem: Medication: Goal: Compliance with prescribed medication regimen will improve by discharge Outcome: Progressing   Problem: Physical Regulation: Goal: Hemodynamic stability will return to baseline for the patient by discharge Outcome: Progressing Goal: Diagnostic test results will improve Outcome: Progressing Goal: Will remain free from infection Outcome: Progressing   Problem: Education: Goal: Knowledge of General Education information will improve Description Including pain rating scale, medication(s)/side effects and non-pharmacologic comfort measures Outcome: Progressing   Problem: Health Behavior/Discharge Planning: Goal: Ability to manage health-related needs will improve Outcome: Progressing   Problem: Clinical Measurements: Goal: Ability to maintain clinical measurements within normal limits will improve Outcome: Progressing Goal: Will remain free from infection Outcome: Progressing Goal: Diagnostic test results will improve Outcome: Progressing Goal: Respiratory complications will improve Outcome: Progressing Goal: Cardiovascular complication will be avoided Outcome: Progressing   Problem: Activity: Goal: Risk for activity intolerance will decrease Outcome: Progressing   Problem: Nutrition: Goal: Adequate nutrition will be maintained Outcome: Progressing   Problem: Coping: Goal: Level of anxiety will decrease Outcome: Progressing   Problem: Elimination: Goal: Will not experience complications related to bowel  motility Outcome: Progressing Goal: Will not experience complications related to urinary retention Outcome: Progressing   Problem: Pain Managment: Goal: General experience of comfort will improve Outcome: Progressing   Problem: Safety: Goal: Ability to remain free from injury will improve Outcome: Progressing   Problem: Skin Integrity: Goal: Risk for impaired skin integrity will decrease Outcome: Progressing

## 2018-06-20 NOTE — Progress Notes (Signed)
received pt from ED with daughter at bedside she stated left lower leg swelling that patient told them began yesterday and has gotten progressively worse.  Swelling and redness noted to left lower leg, very warm to touch. No palpable pulse on the left leg as reported by ED nurse. Pt and family was oriented to room and call light and room. VSS, pt stated leg hurt and was made comfortable. bilat leg elevated on pillows. Family at bedside. Will cont to monitor.

## 2018-06-20 NOTE — Progress Notes (Signed)
Spoke with Dr. Caryn BeeMaier pt has blood pressure of 106/68 and 25 mg of metoprolol ordered tonight. Per MD may hold blood pressure medication.

## 2018-06-20 NOTE — ED Notes (Signed)
Patient returned from ultrasound.

## 2018-06-20 NOTE — Care Management Note (Signed)
Case Management Note  Patient Details  Name: [name] MRN: [id number] Date of Birth: 07/09/1925  Subjective/Objective:   Patient [facility] under observation status for lower left extremity cellulitis. RNCM consulted on patient to provide MOON letter and complete assessment. RNCM spoke with patient and daughters, [name] [phone] is POA. Patient lives alone in a single story townhouse but her daughter is next door and able to help with activities if daily living, etc. Patient uses a rollator walker in the home and a wheelchair when out in public. Daughters provide transport. PCP is Dr [name]. Utilizes [facility] [address]. Was previously on eliquis but patient believes she does not need to be on it, I have offered a voucher but she said she had them in the past and is not interested in taking that medication again. She has also tried PT in the home and on an outpatient basis but finds it to be "silly".                     Action/Plan: RNCM to continue to follow for any needs.   Expected Discharge Date:  06/22/18               Expected Discharge Plan:     In-House Referral:     Discharge planning Services     Post Acute Care Choice:    Choice offered to:     DME Arranged:    DME Agency:     HH Arranged:    HH Agency:     Status of Service:     If discussed at [employer] Meetings, dates discussed:    Additional Comments:  Danielle Souter A Lassie Demorest, RN 06/20/2018, 10:00 AM

## 2018-06-21 NOTE — Progress Notes (Signed)
Patient resting in bed, daughter at bedside. Left leg redness and swelling has decreased. Left leg elevated on pillows. Call bell in reach. Continue to monitor.

## 2018-06-21 NOTE — Plan of Care (Signed)
  Problem: Clinical Measurements: Goal: Ability to avoid or minimize complications of infection will improve Outcome: Progressing   Problem: Skin Integrity: Goal: Skin integrity will improve Outcome: Progressing   Problem: Coping: Goal: Ability to verbalize feelings will improve by discharge Outcome: Progressing Goal: Family members realistic understanding of the patients condition will improve by discharge Outcome: Progressing   Problem: Medication: Goal: Compliance with prescribed medication regimen will improve by discharge Outcome: Progressing   Problem: Physical Regulation: Goal: Hemodynamic stability will return to baseline for the patient by discharge Outcome: Progressing Goal: Diagnostic test results will improve Outcome: Progressing Goal: Will remain free from infection Outcome: Progressing   Problem: Education: Goal: Knowledge of General Education information will improve Description Including pain rating scale, medication(s)/side effects and non-pharmacologic comfort measures Outcome: Progressing   Problem: Health Behavior/Discharge Planning: Goal: Ability to manage health-related needs will improve Outcome: Progressing   Problem: Clinical Measurements: Goal: Ability to maintain clinical measurements within normal limits will improve Outcome: Progressing Goal: Will remain free from infection Outcome: Progressing Goal: Diagnostic test results will improve Outcome: Progressing Goal: Respiratory complications will improve Outcome: Progressing Goal: Cardiovascular complication will be avoided Outcome: Progressing   Problem: Activity: Goal: Risk for activity intolerance will decrease Outcome: Progressing   Problem: Nutrition: Goal: Adequate nutrition will be maintained Outcome: Progressing   Problem: Coping: Goal: Level of anxiety will decrease Outcome: Progressing   Problem: Elimination: Goal: Will not experience complications related to bowel  motility Outcome: Progressing Goal: Will not experience complications related to urinary retention Outcome: Progressing   Problem: Pain Managment: Goal: General experience of comfort will improve Outcome: Progressing   Problem: Safety: Goal: Ability to remain free from injury will improve Outcome: Progressing   Problem: Skin Integrity: Goal: Risk for impaired skin integrity will decrease Outcome: Progressing   

## 2018-06-21 NOTE — Progress Notes (Signed)
[facility] [place] [facility]   PATIENT NAME: Danielle CocoGenelle Good    MR#:  [id number]  DATE OF BIRTH:  09/29/1925  SUBJECTIVE: Admitted for left leg cellulitis, started on Rocephin, clindamycin, today the cellulitis is much better.  CHIEF COMPLAINT:   Chief Complaint  Patient presents with  . Leg Swelling   Has some pain in the left leg much Tylenol is helping her.  No other complaints. REVIEW OF SYSTEMS:    Review of Systems  Constitutional: Negative for chills and fever.  HENT: Negative for hearing loss.   Eyes: Negative for blurred vision, double vision and photophobia.  Respiratory: Negative for cough, hemoptysis and shortness of breath.   Cardiovascular: Negative for palpitations, orthopnea and leg swelling.  Gastrointestinal: Negative for abdominal pain, diarrhea and vomiting.  Genitourinary: Negative for dysuria and urgency.  Musculoskeletal: Negative for myalgias and neck pain.  Neurological: Negative for dizziness, focal weakness, seizures, weakness and headaches.  Psychiatric/Behavioral: Negative for memory loss. The patient does not have insomnia.     Nutrition:  Tolerating Diet: Tolerating PT:      DRUG ALLERGIES:   Allergies  Allergen Reactions  . Alendronate Other (See Comments)    Reaction:  Unknown   . Doxycycline Nausea And Vomiting  . Minocycline Nausea And Vomiting  . Oxycodone Other (See Comments)    Reaction:  Hallucinations   . Sulfa Antibiotics Nausea And Vomiting    VITALS:  Blood pressure 131/80, pulse 74, temperature 97.6 F (36.4 C), temperature source Oral, resp. rate 18, height 5\' 2"  (1.575 m), weight 44.9 kg (99 lb), SpO2 90 %.  PHYSICAL EXAMINATION:   Physical Exam  GENERAL:  84 y.o.-year-old patient lying in the bed with no acute distress.  EYES: Pupils equal, round, reactive to light and accommodation. No scleral icterus. Extraocular muscles intact.  HEENT: Head atraumatic, normocephalic. Oropharynx  and nasopharynx clear.  NECK:  Supple, no jugular venous distention. No thyroid enlargement, no tenderness.  LUNGS: Normal breath sounds bilaterally, no wheezing, rales,rhonchi or crepitation. No use of accessory muscles of respiration.  CARDIOVASCULAR: S1, S2 normal. No murmurs, rubs, or gallops.  ABDOMEN: Soft, nontender, nondistended. Bowel sounds present. No organomegaly or mass.  EXTREMITIES left leg erythematous, slightly tender to palpation, patient has old healed skin breakdown around left ankle. NEUROLOGIC: Cranial nerves II through XII are intact. Muscle strength 5/5 in all extremities. Sensation intact. Gait not checked.  PSYCHIATRIC: The patient is alert and oriented x 3.  SKIN: No obvious rash, lesion, or ulcer.    LABORATORY PANEL:   CBC Recent Labs  Lab 06/19/18 2321  WBC 6.9  HGB 14.3  HCT 42.1  PLT 163   ------------------------------------------------------------------------------------------------------------------  Chemistries  Recent Labs  Lab 06/19/18 2321  NA 141  K 5.0  CL 105  CO2 25  GLUCOSE 79  BUN 18  CREATININE 1.06*  CALCIUM 9.6  AST 36  ALT 18  ALKPHOS 83  BILITOT 1.1   ------------------------------------------------------------------------------------------------------------------  Cardiac Enzymes Recent Labs  Lab 06/19/18 2321  TROPONINI 0.03*   ------------------------------------------------------------------------------------------------------------------  RADIOLOGY:  [place] Venous Img Lower Unilateral Left  Result Date: 06/20/2018 CLINICAL DATA:  84 year old female with left leg swelling and redness. EXAM: Left LOWER EXTREMITY VENOUS DOPPLER ULTRASOUND TECHNIQUE: Gray-scale sonography with graded compression, as well as color Doppler and duplex ultrasound were performed to evaluate the lower extremity deep venous systems from the level of the common femoral vein and including the common femoral, femoral, profunda femoral,  popliteal and  calf veins including the posterior tibial, peroneal and gastrocnemius veins when visible. The superficial great saphenous vein was also interrogated. Spectral Doppler was utilized to evaluate flow at rest and with distal augmentation maneuvers in the common femoral, femoral and popliteal veins. COMPARISON:  None. FINDINGS: Contralateral Common Femoral Vein: Respiratory phasicity is normal and symmetric with the symptomatic side. No evidence of thrombus. Normal compressibility. Common Femoral Vein: No evidence of thrombus. Normal compressibility, respiratory phasicity and response to augmentation. Saphenofemoral Junction: No evidence of thrombus. Normal compressibility and flow on color Doppler imaging. Profunda Femoral Vein: No evidence of thrombus. Normal compressibility and flow on color Doppler imaging. Femoral Vein: No evidence of thrombus. Normal compressibility, respiratory phasicity and response to augmentation. Popliteal Vein: No evidence of thrombus. Normal compressibility, respiratory phasicity and response to augmentation. Calf Veins: No evidence of thrombus. Normal compressibility and flow on color Doppler imaging. Superficial Great Saphenous Vein: No evidence of thrombus. Normal compressibility. Venous Reflux:  None. Other Findings: There is a 6.6 x 5.4 x 5.8 cm cystic structure with floating low-level echogenic debris anterior to the posterior tibial vein. As per family this has been present for years. IMPRESSION: No evidence of deep venous thrombosis. Minimally complex chronic fluid collection in the lateral leg. Electronically Signed   By: Elgie Collard M.D.   On: 06/20/2018 01:31     ASSESSMENT AND PLAN:   Active Problems:   Cellulitis of left lower extremity   #1 left leg cellulitis, clinically improving with IV clindamycin, Rocephin, likely discharge home tomorrow with oral antibiotics. 2.  Essential hypertension: Controlled 3..  Proximal atrial fibrillation: Patient  is on , Plavix, beta-blockers. 4.  History of PAD, stent in both legs.  Continue Plavix, statins. #5 d.econditioning: Physical therapy consult today. Discussed the plan with patient's daughter.   All the records are reviewed and case discussed with Care Management/Social Workerr. Management plans discussed with the patient, family and they are in agreement.  CODE STATUS:full Code  TOTAL TIME TAKING CARE OF THIS PATIENT: .   POSSIBLE D/C IN 1-2 DAYS, DEPENDING ON CLINICAL CONDITION.   Katha Hamming M.D on 06/21/2018 at 10:40 AM  Between 7am to 6pm - Pager - [phone]  After 6pm go to [email or web address] - password [email or web address] [facility]  [facility]  Office  [phone]  CC: Primary care physician; Barbette Reichmann, MD

## 2018-06-22 ENCOUNTER — Encounter: Payer: Self-pay | Admitting: Pharmacist

## 2018-06-22 MED ORDER — FERROUS SULFATE 325 (65 FE) MG PO TABS
325.0000 mg | ORAL_TABLET | ORAL | Status: DC
  Administered 2018-06-22: 325 mg via ORAL
  Filled 2018-06-22 (×2): qty 1

## 2018-06-22 NOTE — Evaluation (Signed)
Physical Therapy Evaluation Patient Details Name: [name] MRN: [id number] DOB: 29-Jan-1925 Today's Date: 06/22/2018   History of Present Illness  Patient is a 84 year old female admitted with L LE cellulitis and wound to left lateral ankle.   Clinical Impression  Patient lives alone at baseline, has daughter across the street to assist as needed. Patient uses rollator for mobility. Patient currently limited in mobility and ambulation due to pain in L LE. Patient able to ambulate short distance with rw, cga and limited weight bearing on left LE due to pain. Patient will benefit from [facility] services when discharged home.     Follow Up Recommendations Home health PT    Equipment Recommendations       Recommendations for Other Services       Precautions / Restrictions Precautions Precautions: None Restrictions Weight Bearing Restrictions: No Other Position/Activity Restrictions: WBAT L LE      Mobility  Bed Mobility               General bed mobility comments: NT, patient in recliner    Transfers Overall transfer level: Modified independent Equipment used: Rolling walker (2 wheeled)                Ambulation/Gait Ambulation/Gait assistance: Modified independent (Device/Increase time);Min guard   Assistive device: Rolling walker (2 wheeled) Gait Pattern/deviations: Decreased stance time - left;Decreased dorsiflexion - left;Antalgic     General Gait Details: patient keeping left LE forward and demonstrates decreased heel stricke during ambulation. Cues to keep close to ad to allow UEs to support L LE  Stairs            Wheelchair Mobility    Modified Rankin (Stroke Patients Only)       Balance Overall balance assessment: No apparent balance deficits (not formally assessed)                                           Pertinent Vitals/Pain Pain Assessment: 0-10 Pain Score: 6  Pain Descriptors / Indicators:  Throbbing;Constant Pain Intervention(s): Limited activity within patient's tolerance;Monitored during session;RN gave pain meds during session    Home Living Family/patient expects to be discharged to:: Private residence Living Arrangements: Alone Available Help at Discharge: Family Type of Home: House Home Access: Level entry     Home Layout: One level Home Equipment: Environmental consultant - 4 wheels;Shower seat      Prior Function Level of Independence: Independent with assistive device(s)         Comments: pt using rollator for mobility at home. lives alone, daughter lives next door and is available to assist as needed.       Hand Dominance        Extremity/Trunk Assessment   Upper Extremity Assessment Upper Extremity Assessment: Overall WFL for tasks assessed    Lower Extremity Assessment Lower Extremity Assessment: Overall WFL for tasks assessed    Cervical / Trunk Assessment Cervical / Trunk Assessment: Kyphotic  Communication   Communication: No difficulties  Cognition Arousal/Alertness: Awake/alert Behavior During Therapy: WFL for tasks assessed/performed Overall Cognitive Status: Within Functional Limits for tasks assessed                                        General Comments  Exercises Other Exercises Other Exercises: instructed patient and caregiver in performing AP while seated in chair/bed to improve circulation and mobility in L ankle   Assessment/Plan    PT Assessment Patient needs continued PT services  PT Problem List Decreased mobility;Decreased activity tolerance;Decreased skin integrity;Pain       PT Treatment Interventions DME instruction;Gait training;Patient/family education;Therapeutic exercise    PT Goals (Current goals can be found in the Care Plan section)  Acute Rehab PT Goals Patient Stated Goal: to return home, decrease pain PT Goal Formulation: With patient/family Time For Goal Achievement: 07/06/18 Potential  to Achieve Goals: Good    Frequency Min 2X/week   Barriers to discharge        Co-evaluation               AM-PAC PT "6 Clicks" Daily Activity  Outcome Measure Difficulty turning over in bed (including adjusting bedclothes, sheets and blankets)?: Unable Difficulty moving from lying on back to sitting on the side of the bed? : Unable Difficulty sitting down on and standing up from a chair with arms (e.g., wheelchair, bedside commode, etc,.)?: Unable Help needed moving to and from a bed to chair (including a wheelchair)?: A Little Help needed walking in hospital room?: A Little Help needed climbing 3-5 steps with a railing? : A Lot 6 Click Score: 11    End of Session Equipment Utilized During Treatment: Gait belt Activity Tolerance: Patient tolerated treatment well;Patient limited by pain Patient left: in chair;with family/visitor present   PT Visit Diagnosis: Other abnormalities of gait and mobility (R26.89);Difficulty in walking, not elsewhere classified (R26.2);Pain Pain - Right/Left: Left Pain - part of body: Ankle and joints of foot    Time: [id number] PT Time Calculation (min) (ACUTE ONLY): 14 min   Charges:   PT Evaluation $PT Eval Low Complexity: 1 Low        Banesa Tristan, PT, GCS 06/22/18,10:03 AM

## 2018-06-22 NOTE — Progress Notes (Signed)
[facility] [place] at [facility]   PATIENT NAME: Danielle CocoGenelle Good    MR#:  [id number]  DATE OF BIRTH:  06/15/1925  SUBJECTIVE: Admitted for left leg cellulitis, started on Rocephin, clindamycin, today the cellulitis is much better.  Has some pain in the left leg, did not sleep well last night.  CHIEF COMPLAINT:   Chief Complaint  Patient presents with  . Leg Swelling   . REVIEW OF SYSTEMS:    Review of Systems  Constitutional: Negative for chills and fever.  HENT: Negative for hearing loss.   Eyes: Negative for blurred vision, double vision and photophobia.  Respiratory: Negative for cough, hemoptysis and shortness of breath.   Cardiovascular: Negative for palpitations, orthopnea and leg swelling.  Gastrointestinal: Negative for abdominal pain, diarrhea and vomiting.  Genitourinary: Negative for dysuria and urgency.  Musculoskeletal: Negative for myalgias and neck pain.  Skin:       Patient has less redness of the left leg, there is a wound on the left ankle  Neurological: Negative for dizziness, focal weakness, seizures, weakness and headaches.  Psychiatric/Behavioral: Negative for memory loss. The patient does not have insomnia.     Nutrition:  Tolerating Diet: Tolerating PT:      DRUG ALLERGIES:   Allergies  Allergen Reactions  . Alendronate Other (See Comments)    Reaction:  Unknown   . Doxycycline Nausea And Vomiting  . Minocycline Nausea And Vomiting  . Oxycodone Other (See Comments)    Reaction:  Hallucinations   . Sulfa Antibiotics Nausea And Vomiting    VITALS:  Blood pressure (!) 146/78, pulse 67, temperature 97.6 F (36.4 C), temperature source Axillary, resp. rate 18, height 5\' 2"  (1.575 m), weight 44.9 kg (99 lb), SpO2 97 %.  PHYSICAL EXAMINATION:   Physical Exam  GENERAL:  84 y.o.-year-old patient lying in the bed with no acute distress.  EYES: Pupils equal, round, reactive to light and accommodation. No scleral  icterus. Extraocular muscles intact.  HEENT: Head atraumatic, normocephalic. Oropharynx and nasopharynx clear.  NECK:  Supple, no jugular venous distention. No thyroid enlargement, no tenderness.  LUNGS: Normal breath sounds bilaterally, no wheezing, rales,rhonchi or crepitation. No use of accessory muscles of respiration.  CARDIOVASCULAR: S1, S2 normal. No murmurs, rubs, or gallops.  ABDOMEN: Soft, nontender, nondistended. Bowel sounds present. No organomegaly or mass.  EXTREMITIES left leg erythematous, slightly tender to palpation, patient has old healed skin breakdown around left ankle. NEUROLOGIC: Cranial nerves II through XII are intact. Muscle strength 5/5 in all extremities. Sensation intact. Gait not checked.  PSYCHIATRIC: The patient is alert and oriented x 3.  SKIN: No obvious rash, lesion, or ulcer.    LABORATORY PANEL:   CBC Recent Labs  Lab 06/19/18 2321  WBC 6.9  HGB 14.3  HCT 42.1  PLT 163   ------------------------------------------------------------------------------------------------------------------  Chemistries  Recent Labs  Lab 06/19/18 2321  NA 141  K 5.0  CL 105  CO2 25  GLUCOSE 79  BUN 18  CREATININE 1.06*  CALCIUM 9.6  AST 36  ALT 18  ALKPHOS 83  BILITOT 1.1   ------------------------------------------------------------------------------------------------------------------  Cardiac Enzymes Recent Labs  Lab 06/19/18 2321  TROPONINI 0.03*   ------------------------------------------------------------------------------------------------------------------  RADIOLOGY:  No results found.   ASSESSMENT AND PLAN:   Active Problems:   Cellulitis of left lower extremity   #1 left leg cellulitis, clinically improving with IV clindamycin, Rocephin, still has pain with weightbearing.  Seen by wound care nurse, recommended antimicrobial wound dressing.  2.  Essential hypertension: Controlled 3.. Paroxysmal l atrial fibrillation: Patient  is on , Plavix, beta-blockers. 4.  History of PAD, stent in both legs.  Continue Plavix, statins. #5 d.econditioning:  physical therapy recommended home health physical therapy.    Discussed the plan with patient's daughter.  Discharge home with home health PT, nurse tomorrow.   All the records are reviewed and case discussed with Care Management/Social Workerr. Management plans discussed with the patient, family and they are in agreement.  CODE STATUS:full Code  TOTAL TIME TAKING CARE OF THIS PATIENT: .   POSSIBLE D/C IN 1-2 DAYS, DEPENDING ON CLINICAL CONDITION.   Katha Hamming M.D on 06/22/2018 at 12:38 PM  Between 7am to 6pm - Pager - [phone]  After 6pm go to [email or web address] - password [email or web address] [facility]  [place] [facility]  Office  [phone]  CC: Primary care physician; Barbette Reichmann, MD

## 2018-06-22 NOTE — Care Management (Signed)
Spoke with daughter Amada JupiterDale. Appointment made to meet with her in the morning to set up discharge plan.

## 2018-06-22 NOTE — Consult Note (Signed)
WOC Nurse wound consult note Reason for Consult: LLE cellulitis and healing wound at lateral malleolus. History of PAD, has had stenting in both LEs. Wound type: infectious Pressure Injury POA: NA Measurement: Area affected measures 5cm x 5cm. Wounds with dried serum and crusting. Wound bed: As described above Drainage (amount, consistency, odor) none Periwound: erythematous, but improving Dressing procedure/placement/frequency: Compression is not recommended due to PAD history and stenting procedures in the past. Suggest antimicrobial wound dressing with hydrating property (ie., xeroform gauze) once daily and gently (light) securement with conform bandage/paper tape.  Floatation of heels should continue.  WOC nursing team will not follow, but will remain available to this patient, the nursing and medical teams.  Please re-consult if needed. Thanks, Ladona MowLaurie Dorethea Strubel, MSN, RN, GNP, Hans EdenCWOCN, CWON-AP, FAAN  Pager# [phone]

## 2018-06-23 ENCOUNTER — Observation Stay: Payer: Medicare Other

## 2018-06-23 MED ORDER — FERROUS SULFATE 325 (65 FE) MG PO TABS: 325.0000 mg | ORAL_TABLET | ORAL | 0 refills | Status: AC

## 2018-06-23 MED ORDER — CEPHALEXIN 250 MG PO CAPS: 250.0000 mg | ORAL_CAPSULE | Freq: Two times a day (BID) | ORAL | 0 refills | Status: AC

## 2018-06-23 MED ORDER — CEPHALEXIN 500 MG PO CAPS: 500.0000 mg | ORAL_CAPSULE | Freq: Two times a day (BID) | ORAL | 0 refills | Status: DC

## 2018-06-23 NOTE — Discharge Summary (Addendum)
[name], is a 84 y.o. female  DOB 1924/12/05  MRN [id number].  Admission date:  06/19/2018  Admitting Physician  Barbaraann Rondo, MD  Discharge Date:  06/23/2018   Primary MD  Barbette Reichmann, MD  Recommendations for primary care physician for things to follow:   Follow-up with PCP as scheduled.   Admission Diagnosis  Claudication [facility]) [I73.9] Leg swelling [M79.89]   Discharge Diagnosis  Claudication [facility]) [I73.9] Leg swelling [M79.89]    Active Problems:   Cellulitis of left lower extremity      Past Medical History:  Diagnosis Date  . Atrial fibrillation (HCC)   . Degenerative arthritis   . Degenerative joint disease   . Foot ulcer (HCC)    Chronic left ankle ulcer  . GERD (gastroesophageal reflux disease)   . Hypertension   . Osteoporosis   . PAD (peripheral artery disease) (HCC)    s/p bypass and left leg stents  . Peripheral neuropathy     Past Surgical History:  Procedure Laterality Date  . ABDOMINAL HYSTERECTOMY    . Femoral bypass surgery     left leg  . STENT PLACEMENT ILIAC ([facility] HX)     Left leg  . TONSILLECTOMY  age 11       History of present illness and  Hospital Course:     Kindly see H&P for history of present illness and admission details, please review complete Labs, Consult reports and Test reports for all details in brief  HPI  from the history and physical done on the day of admission 84 year old female patient admitted on July 20 for swelling, admitted for left leg cellulitis.  Patient has long-standing history of left malleolar ulcer with poor wound healing secondary to vascular disease.  Patient came in because of persistent pain in the left leg and also swelling and redness of the left leg.  Discharge home with Keflex 500 mg p.o. twice daily for 7 days for left  leg cellulitis.  Hospital Course  Left leg cellulitis: Admitted to medical service, patient received clindamycin, Rocephin.  Ultrasound of the left leg did not show any DVT.  The patient is a conservative management with IV antibiotics, Tylenol for pain control.  Patient also seen by wound care nurse because of Wound at lateral malleolus.  Wound care nurse recommended microbial wound dressing with hydrating dressing likwXeroform gauze daily and secured with conform bandage.  Patient will get home care nurse to do dressing changes.  Blood workl is largely within normal range.  Blood cultures have been negative for 3 days.. Needs kelfex 250 mg po bid for 7 days/ 2.  Essential hypertension: Controlled 3.. Paroxysmal l atrial fibrillation: Patient is on , Plavix, beta-blockers.,  Digoxin 4.  History of PAD, stent in both legs.  Continue Plavix, statins. #5 d.econditioning:  physical therapy recommended home health physical therapy.  Patient stable for discharge. 6.  Transient hypoxia likely secondary to malfunctioning pulse ox chest x-ray did not show pneumonia, patient is on room air and saturation is 91% on room air.  Patient is stable for discharge.  Discussed the plan with patient's daughter.     Discharge Condition: stable   Follow UP      Discharge Instructions  and  Discharge Medications     Allergies as of 06/23/2018      Reactions   Alendronate Other (See Comments)   Reaction:  Unknown    Doxycycline Nausea And Vomiting   Minocycline Nausea And Vomiting  Oxycodone Other (See Comments)   Reaction:  Hallucinations    Sulfa Antibiotics Nausea And Vomiting      Medication List    STOP taking these medications   acetaminophen 500 MG tablet Commonly known as:  TYLENOL   ALPRAZolam 0.25 MG tablet Commonly known as:  XANAX   cetirizine 10 MG tablet Commonly known as:  ZYRTEC   HYDROcodone-acetaminophen 5-325 MG tablet Commonly known as:  NORCO/VICODIN   meclizine 25  MG tablet Commonly known as:  ANTIVERT     TAKE these medications   cephALEXin 500 MG capsule Commonly known as:  KEFLEX Take 1 capsule (500 mg total) by mouth 2 (two) times daily for 7 days.   cetirizine-pseudoephedrine 5-120 MG tablet Commonly known as:  ZYRTEC-D Take 1 tablet by mouth 2 (two) times daily as needed for allergies.   clopidogrel 75 MG tablet Commonly known as:  PLAVIX Take 75 mg by mouth daily.   digoxin 0.125 MG tablet Commonly known as:  LANOXIN Take 0.125 mg by mouth 4 (four) times a week. Sunday, Tuesday, Thursday, Saturday.   docusate sodium 100 MG capsule Commonly known as:  COLACE Take 100 mg by mouth 2 (two) times daily as needed for mild constipation.   ferrous sulfate 325 (65 FE) MG tablet Take 1 tablet (325 mg total) by mouth every Monday, Wednesday, and Friday. Start taking on:  06/24/2018 What changed:  See the new instructions.   fluticasone 50 MCG/ACT nasal spray Commonly known as:  FLONASE Place 2 sprays into both nostrils at bedtime.   furosemide 20 MG tablet Commonly known as:  LASIX Take 1 tablet (20 mg total) by mouth daily. Can use additional 20 mg PO lasix as need for worsening SOB, LEG SWELLING or more than 3 lbs wt gain in a week What changed:  Another medication with the same name was removed. Continue taking this medication, and follow the directions you see here.   gabapentin 300 MG capsule Commonly known as:  NEURONTIN Take 300 mg by mouth 2 (two) times daily.   losartan 50 MG tablet Commonly known as:  COZAAR Take 50 mg by mouth daily.   metoprolol tartrate 25 MG tablet Commonly known as:  LOPRESSOR Take 25 mg by mouth 2 (two) times daily.   phenylephrine 10 MG Tabs tablet Commonly known as:  SUDAFED PE Take 10 mg by mouth every 4 (four) hours as needed (for congestion).   ranitidine 150 MG tablet Commonly known as:  ZANTAC Take 150 mg by mouth 2 (two) times daily as needed for heartburn.   rOPINIRole 2 MG  tablet Commonly known as:  REQUIP Take 2 mg by mouth at bedtime.   simvastatin 10 MG tablet Commonly known as:  ZOCOR Take 10 mg by mouth at bedtime.   Vitamin D (Ergocalciferol) 50000 units Caps capsule Commonly known as:  DRISDOL Take 50,000 Units by mouth every 7 (seven) days. Pt takes on Tuesday.         Diet and Activity recommendation: See Discharge Instructions above Suggest antimicrobial wound dressing with hydrating property (ie., xeroform gauze) once daily and gently (light) securement with conform bandage/paper tape.  Floatation of heels should continue    Consults obtained -woundnurse, physical therapy, case manager  Major procedures and Radiology Reports - PLEASE review detailed and final reports for all details, in brief -      US Venous Img Lower Unilateral Left  Result Date: 06/20/2018 CLINICAL DATA:  84 year old female with left leg swelling and  redness. EXAM: Left LOWER EXTREMITY VENOUS DOPPLER ULTRASOUND TECHNIQUE: Gray-scale sonography with graded compression, as well as color Doppler and duplex ultrasound were performed to evaluate the lower extremity deep venous systems from the level of the common femoral vein and including the common femoral, femoral, profunda femoral, popliteal and calf veins including the posterior tibial, peroneal and gastrocnemius veins when visible. The superficial great saphenous vein was also interrogated. Spectral Doppler was utilized to evaluate flow at rest and with distal augmentation maneuvers in the common femoral, femoral and popliteal veins. COMPARISON:  None. FINDINGS: Contralateral Common Femoral Vein: Respiratory phasicity is normal and symmetric with the symptomatic side. No evidence of thrombus. Normal compressibility. Common Femoral Vein: No evidence of thrombus. Normal compressibility, respiratory phasicity and response to augmentation. Saphenofemoral Junction: No evidence of thrombus. Normal compressibility and flow on  color Doppler imaging. Profunda Femoral Vein: No evidence of thrombus. Normal compressibility and flow on color Doppler imaging. Femoral Vein: No evidence of thrombus. Normal compressibility, respiratory phasicity and response to augmentation. Popliteal Vein: No evidence of thrombus. Normal compressibility, respiratory phasicity and response to augmentation. Calf Veins: No evidence of thrombus. Normal compressibility and flow on color Doppler imaging. Superficial Great Saphenous Vein: No evidence of thrombus. Normal compressibility. Venous Reflux:  None. Other Findings: There is a 6.6 x 5.4 x 5.8 cm cystic structure with floating low-level echogenic debris anterior to the posterior tibial vein. As per family this has been present for years. IMPRESSION: No evidence of deep venous thrombosis. Minimally complex chronic fluid collection in the lateral leg. Electronically Signed   By: Elgie Collard M.D.   On: 06/20/2018 01:31   Dg Chest Port 1 View  Result Date: 06/23/2018 CLINICAL DATA:  Shortness of breath EXAM: PORTABLE CHEST 1 VIEW COMPARISON:  10/25/2015 FINDINGS: Cardiomegaly. Small bilateral pleural effusions. No confluent airspace opacity. Biapical scarring. IMPRESSION: Cardiomegaly, biapical scarring. Small bilateral effusions. Electronically Signed   By: Charlett Nose M.D.   On: 06/23/2018 08:17    Micro Results    Recent Results (from the past 240 hour(s))  Blood Culture (routine x 2)     Status: None (Preliminary result)   Collection Time: 06/19/18 11:35 PM  Result Value Ref Range Status   Specimen Description BLOOD LEFT FOREARM  Final   Special Requests   Final    BOTTLES DRAWN AEROBIC AND ANAEROBIC Blood Culture adequate volume   Culture   Final    NO GROWTH 3 DAYS Performed at [facility], [address] [place], [place] [address]    Report Status PENDING  Incomplete  Blood Culture (routine x 2)     Status: None (Preliminary result)   Collection Time: 06/19/18 11:50 PM   Result Value Ref Range Status   Specimen Description BLOOD LEFT ANTECUBITAL  Final   Special Requests   Final    BOTTLES DRAWN AEROBIC AND ANAEROBIC Blood Culture adequate volume   Culture   Final    NO GROWTH 3 DAYS Performed at [facility], [address], [place], [place] [address]    Report Status PENDING  Incomplete       Today   Subjective:   [name] today ha no fever, decreased pain in the left leg and stable for discharge  Objective:   Blood pressure 126/84, pulse 72, temperature 98 F (36.7 C), temperature source Oral, resp. rate 18, height 5\' 2"  (1.575 m), weight 44.9 kg (99 lb), SpO2 99 %.   Intake/Output Summary (Last 24 hours) at 06/23/2018  [id number] Last data filed at 06/23/2018 0844 Gross per 24 hour  Intake 866.67 ml  Output 250 ml  Net 616.67 ml    Exam Awake Alert, Oriented x 3, No new F.N deficits, Normal affect [place].AT,PERRAL Supple Neck,No JVD, No cervical lymphadenopathy appriciated.  Symmetrical Chest wall movement, Good air movement bilaterally, CTAB RRR,No Gallops,Rubs or new Murmurs, No Parasternal Heave +ve B.Sounds, Abd Soft, Non tender, No organomegaly appriciated, No rebound -guarding or rigidity.  healed ulcer in the left malleolus, decreased redness of left leg.  Data Review   CBC w Diff:  Lab Results  Component Value Date   WBC 6.9 06/19/2018   HGB 14.3 06/19/2018   HGB 9.6 (L) 12/16/2012   HCT 42.1 06/19/2018   HCT 27.4 (L) 12/16/2012   PLT 163 06/19/2018   PLT 169 12/16/2012   LYMPHOPCT 22 06/19/2018   LYMPHOPCT 18.3 12/16/2012   MONOPCT 9 06/19/2018   MONOPCT 7.5 12/16/2012   EOSPCT 3 06/19/2018   EOSPCT 0.1 12/16/2012   BASOPCT 1 06/19/2018   BASOPCT 0.3 12/16/2012    CMP:  Lab Results  Component Value Date   NA 141 06/19/2018   NA 142 12/17/2012   K 5.0 06/19/2018   K 3.8 12/17/2012   CL 105 06/19/2018   CL 108 (H) 12/17/2012   CO2 25 06/19/2018   CO2 26 12/17/2012   BUN 18 06/19/2018   BUN  25 (H) 05/03/2013   CREATININE 1.06 (H) 06/19/2018   CREATININE 0.94 05/03/2013   PROT 7.4 06/19/2018   ALBUMIN 4.3 06/19/2018   BILITOT 1.1 06/19/2018   ALKPHOS 83 06/19/2018   AST 36 06/19/2018   ALT 18 06/19/2018  .   Total Time in preparing paper work, data evaluation and todays exam - 35 minutes  Katha HammingSnehalatha Elfie Costanza M.D on 06/23/2018 at 9:22 AM    Note: This dictation was prepared with Dragon dictation along with smaller phrase technology. Any transcriptional errors that result from this process are unintentional.

## 2018-06-23 NOTE — Care Management Note (Signed)
Case Management Note  Patient Details  Name: [name] MRN: [id number] Date of Birth: 1925/02/07  Subjective/Objective: Met with patient and her daughter at bedside. Her daughter plans to stay with her for the first week at home. She has a rolator, cane, walker, bsc and wheelchair. She has used [facility] in the past and would like to use them again. Referral to [facility] for RN, PT, HHA and OT. PCP is Dr. Ginette Pitman.                    Action/Plan:   Expected Discharge Date:  06/23/18               Expected Discharge Plan:  [place]  In-House Referral:     Discharge planning Services  CM Consult  Post Acute Care Choice:  Home Health Choice offered to:  Patient, Adult Children  DME Arranged:    DME Agency:     HH Arranged:  RN, PT, OT, Nurse's Aide [place] Agency:  [facility]  Status of Service:  Completed, signed off  If discussed at [place] of Stay Meetings, dates discussed:    Additional Comments:  Jolly Mango, RN 06/23/2018, 10:06 AM

## 2018-06-23 NOTE — Progress Notes (Signed)
Physical Therapy Treatment Patient Details Name: [name] MRN: [id number] DOB: November 29, 1925 Today's Date: 06/23/2018    History of Present Illness Patient is a 84 year old female admitted with L LE cellulitis and wound to left lateral ankle.     PT Comments    To edge of bed with HOB raised and min guard.  Some difficulty but able to do without assist.  Initially upon standing, she needed min/mod a x 1 to gain balance.  She had post lean and kept LLE too far out in front of her and was hesitant too much weight on it.  After some time, stance improved and she was able to ambulate to door with min a x 1 and slow gait with verbal cues to improve walker position.  After seated rest, she was able to walk an additional 10' with min guard/min a x 1.    Discussed at length discharge plan and safety along with guarding techniques.  Daughter was given hands on practice time during gait and voiced being comfortable.  Encouraged her to purchase gait belt.  Reviewed with pt no gait/transfers without assist from daughter and she voiced understanding.  Comfortable with discharge plan.   Follow Up Recommendations  Home health PT     Equipment Recommendations       Recommendations for Other Services       Precautions / Restrictions Precautions Precautions: None Restrictions Weight Bearing Restrictions: No Other Position/Activity Restrictions: WBAT L LE    Mobility  Bed Mobility Overal bed mobility: Needs Assistance Bed Mobility: Supine to Sit     Supine to sit: Min assist        Transfers Overall transfer level: Needs assistance Equipment used: Rolling walker (2 wheeled) Transfers: Sit to/from Stand Sit to Stand: Min assist         General transfer comment: Generally unsteady initially upon standing requiring support and increased time to gain balance.  Verbal cues to pull LLE back under her before trying to gait.  Ambulation/Gait Ambulation/Gait assistance: Min  assist Gait Distance (Feet): 10 Feet Assistive device: Rolling walker (2 wheeled) Gait Pattern/deviations: Step-to pattern;Decreased step length - left   Gait velocity interpretation: <1.8 ft/sec, indicate of risk for recurrent falls     Stairs             Wheelchair Mobility    Modified Rankin (Stroke Patients Only)       Balance Overall balance assessment: Needs assistance Sitting-balance support: Feet supported Sitting balance-Leahy Scale: Fair     Standing balance support: Bilateral upper extremity supported Standing balance-Leahy Scale: Poor Standing balance comment: Post lean initially which improved with second walk.                            Cognition Arousal/Alertness: Awake/alert Behavior During Therapy: WFL for tasks assessed/performed Overall Cognitive Status: Within Functional Limits for tasks assessed                                        Exercises Other Exercises Other Exercises: discharge planning, safety and guarding techniques with pt and daughter    General Comments        Pertinent Vitals/Pain Pain Assessment: 0-10 Pain Score: 4  Pain Location: L ankle Pain Descriptors / Indicators: Throbbing;Constant Pain Intervention(s): Limited activity within patient's tolerance;Monitored during session    Home  Living                      Prior Function            PT Goals (current goals can now be found in the care plan section) Progress towards PT goals: Progressing toward goals    Frequency    Min 2X/week      PT Plan Current plan remains appropriate    Co-evaluation              AM-PAC PT "6 Clicks" Daily Activity  Outcome Measure  Difficulty turning over in bed (including adjusting bedclothes, sheets and blankets)?: Unable Difficulty moving from lying on back to sitting on the side of the bed? : Unable Difficulty sitting down on and standing up from a chair with arms (e.g.,  wheelchair, bedside commode, etc,.)?: Unable Help needed moving to and from a bed to chair (including a wheelchair)?: A Little Help needed walking in hospital room?: A Lot Help needed climbing 3-5 steps with a railing? : A Lot 6 Click Score: 10    End of Session Equipment Utilized During Treatment: Gait belt Activity Tolerance: Patient tolerated treatment well;Patient limited by pain Patient left: in chair;with chair alarm set;with call bell/phone within reach;with family/visitor present   Pain - Right/Left: Left Pain - part of body: Ankle and joints of foot     Time: 1001-1026 PT Time Calculation (min) (ACUTE ONLY): 25 min  Charges:  $Gait Training: 8-22 mins $Therapeutic Activity: 8-22 mins                    G Codes:       Danielle DessSarah Jataya Wann, PTA 06/23/18, 10:42 AM

## 2018-06-23 NOTE — Progress Notes (Signed)
Discharge instructions reviewed with patient and family. Dressing to LLE changed. IV removed without complications. Questions answered. Family stated patient has a follow up appt with PCP in the am. Awaiting w/c for d/c

## 2018-06-25 LAB — CULTURE, BLOOD (ROUTINE X 2)
Culture: NO GROWTH
Culture: NO GROWTH
SPECIAL REQUESTS: ADEQUATE
Special Requests: ADEQUATE

## 2018-07-21 ENCOUNTER — Encounter: Payer: Self-pay | Admitting: Podiatry

## 2018-07-21 ENCOUNTER — Ambulatory Visit: Payer: Medicare Other | Admitting: Podiatry

## 2018-07-21 DIAGNOSIS — M79676 Pain in unspecified toe(s): Secondary | ICD-10-CM

## 2018-07-21 DIAGNOSIS — B351 Tinea unguium: Secondary | ICD-10-CM

## 2018-07-21 DIAGNOSIS — I10 Essential (primary) hypertension: Secondary | ICD-10-CM | POA: Insufficient documentation

## 2018-07-21 DIAGNOSIS — I83023 Varicose veins of left lower extremity with ulcer of ankle: Secondary | ICD-10-CM | POA: Diagnosis not present

## 2018-07-21 DIAGNOSIS — K219 Gastro-esophageal reflux disease without esophagitis: Secondary | ICD-10-CM | POA: Insufficient documentation

## 2018-07-21 DIAGNOSIS — M79609 Pain in unspecified limb: Principal | ICD-10-CM

## 2018-07-21 DIAGNOSIS — L97322 Non-pressure chronic ulcer of left ankle with fat layer exposed: Secondary | ICD-10-CM

## 2018-07-21 DIAGNOSIS — M1990 Unspecified osteoarthritis, unspecified site: Secondary | ICD-10-CM | POA: Insufficient documentation

## 2018-07-23 NOTE — Progress Notes (Signed)
   SUBJECTIVE Patient presents to office today complaining of elongated, thickened nails that cause pain while ambulating in shoes. She is unable to trim her own nails. She also reports a wound noted to the left ankle but states it is being [place] [facility] [place] [facility]. Patient is here for further evaluation and treatment.  Past Medical History:  Diagnosis Date  . Atrial fibrillation (HCC)   . Degenerative arthritis   . Degenerative joint disease   . Foot ulcer (HCC)    Chronic left ankle ulcer  . GERD (gastroesophageal reflux disease)   . Hypertension   . Osteoporosis   . PAD (peripheral artery disease) (HCC)    s/p bypass and left leg stents  . Peripheral neuropathy     OBJECTIVE General Patient is awake, alert, and oriented x 3 and in no acute distress. Derm Wound noted to the left ankle. To the above-noted ulceration, there is no eschar. There is a moderate amount of slough, fibrin and necrotic tissue. Granulation tissue and wound base is red. There is no malodor. There is a minimal amount of serosanginous drainage noted. Periwound integrity is intact.  Skin is dry and supple bilateral. Remaining integument unremarkable. Nails are tender, long, thickened and dystrophic with subungual debris, consistent with onychomycosis, 1-5 bilateral. No signs of infection noted. Vasc  DP and PT pedal pulses palpable bilaterally. Temperature gradient within normal limits.  Neuro Epicritic and protective threshold sensation grossly intact bilaterally.  Musculoskeletal Exam No symptomatic pedal deformities noted bilateral. Muscular strength within normal limits.  ASSESSMENT 1. Onychodystrophic nails 1-5 bilateral with hyperkeratosis of nails.  2. Onychomycosis of nail due to dermatophyte bilateral 3. Pain in foot bilateral 4. Ulceration left ankle secondary to venous insufficiency   PLAN OF CARE 1. Patient evaluated today.  2. Instructed to maintain good pedal hygiene and  foot care.  3. Mechanical debridement of nails 1-5 bilaterally performed using a nail nipper. Filed with dremel without incident.  4. [facility] on [facility]/10/18.  5. [place] [facility].  6. Return to clinic in 3 mos.    Felecia ShellingBrent M. Lashina Milles, DPM [facility]  Dr. Felecia ShellingBrent M. Wendell Fiebig, DPM    [address]                                        [place], [place] [address]                Office [phone]  Fax [phone]

## 2018-09-24 ENCOUNTER — Other Ambulatory Visit: Payer: Self-pay | Admitting: Internal Medicine

## 2018-09-24 DIAGNOSIS — I720 Aneurysm of carotid artery: Secondary | ICD-10-CM

## 2018-10-06 ENCOUNTER — Ambulatory Visit: Payer: Medicare Other

## 2018-10-23 ENCOUNTER — Ambulatory Visit: Payer: Medicare Other | Admitting: Podiatry

## 2018-10-27 ENCOUNTER — Encounter: Payer: Self-pay | Admitting: Emergency Medicine

## 2018-10-27 ENCOUNTER — Other Ambulatory Visit: Payer: Self-pay

## 2018-10-27 ENCOUNTER — Ambulatory Visit
Admission: RE | Admit: 2018-10-27 | Discharge: 2018-10-27 | Disposition: A | Discharge status: Home / Self Care | Payer: Medicare Other | Source: Ambulatory Visit | Attending: Physician Assistant | Admitting: Physician Assistant

## 2018-10-27 ENCOUNTER — Inpatient Hospital Stay
Admission: EM | Admit: 2018-10-27 | Discharge: 2018-10-30 | DRG: 291 | Disposition: A | Discharge status: Home Health | Payer: Medicare Other | Attending: Internal Medicine | Admitting: Internal Medicine

## 2018-10-27 ENCOUNTER — Other Ambulatory Visit: Payer: Self-pay | Admitting: Physician Assistant

## 2018-10-27 DIAGNOSIS — M810 Age-related osteoporosis without current pathological fracture: Secondary | ICD-10-CM | POA: Diagnosis present

## 2018-10-27 DIAGNOSIS — I2510 Atherosclerotic heart disease of native coronary artery without angina pectoris: Secondary | ICD-10-CM | POA: Insufficient documentation

## 2018-10-27 DIAGNOSIS — Z7902 Long term (current) use of antithrombotics/antiplatelets: Secondary | ICD-10-CM

## 2018-10-27 DIAGNOSIS — S2241XD Multiple fractures of ribs, right side, subsequent encounter for fracture with routine healing: Secondary | ICD-10-CM

## 2018-10-27 DIAGNOSIS — Z90710 Acquired absence of both cervix and uterus: Secondary | ICD-10-CM | POA: Diagnosis not present

## 2018-10-27 DIAGNOSIS — W19XXXA Unspecified fall, initial encounter: Secondary | ICD-10-CM

## 2018-10-27 DIAGNOSIS — M1990 Unspecified osteoarthritis, unspecified site: Secondary | ICD-10-CM | POA: Diagnosis present

## 2018-10-27 DIAGNOSIS — I5033 Acute on chronic diastolic (congestive) heart failure: Secondary | ICD-10-CM | POA: Diagnosis present

## 2018-10-27 DIAGNOSIS — W1830XA Fall on same level, unspecified, initial encounter: Secondary | ICD-10-CM | POA: Diagnosis present

## 2018-10-27 DIAGNOSIS — L97329 Non-pressure chronic ulcer of left ankle with unspecified severity: Secondary | ICD-10-CM | POA: Diagnosis present

## 2018-10-27 DIAGNOSIS — R1084 Generalized abdominal pain: Secondary | ICD-10-CM | POA: Insufficient documentation

## 2018-10-27 DIAGNOSIS — S2241XA Multiple fractures of ribs, right side, initial encounter for closed fracture: Secondary | ICD-10-CM | POA: Diagnosis present

## 2018-10-27 DIAGNOSIS — I110 Hypertensive heart disease with heart failure: Secondary | ICD-10-CM | POA: Diagnosis present

## 2018-10-27 DIAGNOSIS — Z87891 Personal history of nicotine dependence: Secondary | ICD-10-CM

## 2018-10-27 DIAGNOSIS — R0789 Other chest pain: Secondary | ICD-10-CM | POA: Insufficient documentation

## 2018-10-27 DIAGNOSIS — N179 Acute kidney failure, unspecified: Secondary | ICD-10-CM | POA: Diagnosis present

## 2018-10-27 DIAGNOSIS — I959 Hypotension, unspecified: Secondary | ICD-10-CM | POA: Diagnosis present

## 2018-10-27 DIAGNOSIS — I081 Rheumatic disorders of both mitral and tricuspid valves: Secondary | ICD-10-CM | POA: Diagnosis present

## 2018-10-27 DIAGNOSIS — Y92009 Unspecified place in unspecified non-institutional (private) residence as the place of occurrence of the external cause: Secondary | ICD-10-CM

## 2018-10-27 DIAGNOSIS — I482 Chronic atrial fibrillation, unspecified: Secondary | ICD-10-CM | POA: Diagnosis present

## 2018-10-27 DIAGNOSIS — G629 Polyneuropathy, unspecified: Secondary | ICD-10-CM | POA: Diagnosis present

## 2018-10-27 DIAGNOSIS — R0902 Hypoxemia: Secondary | ICD-10-CM | POA: Diagnosis present

## 2018-10-27 DIAGNOSIS — Z95820 Peripheral vascular angioplasty status with implants and grafts: Secondary | ICD-10-CM

## 2018-10-27 DIAGNOSIS — Z79899 Other long term (current) drug therapy: Secondary | ICD-10-CM

## 2018-10-27 DIAGNOSIS — R0602 Shortness of breath: Secondary | ICD-10-CM

## 2018-10-27 DIAGNOSIS — I739 Peripheral vascular disease, unspecified: Secondary | ICD-10-CM | POA: Diagnosis present

## 2018-10-27 DIAGNOSIS — K219 Gastro-esophageal reflux disease without esophagitis: Secondary | ICD-10-CM | POA: Diagnosis present

## 2018-10-27 DIAGNOSIS — Z8249 Family history of ischemic heart disease and other diseases of the circulatory system: Secondary | ICD-10-CM | POA: Diagnosis not present

## 2018-10-27 DIAGNOSIS — M419 Scoliosis, unspecified: Secondary | ICD-10-CM

## 2018-10-27 DIAGNOSIS — Z66 Do not resuscitate: Secondary | ICD-10-CM | POA: Diagnosis present

## 2018-10-27 DIAGNOSIS — Y92002 Bathroom of unspecified non-institutional (private) residence single-family (private) house as the place of occurrence of the external cause: Secondary | ICD-10-CM

## 2018-10-27 DIAGNOSIS — Z681 Body mass index (BMI) 19 or less, adult: Secondary | ICD-10-CM

## 2018-10-27 DIAGNOSIS — N1330 Unspecified hydronephrosis: Secondary | ICD-10-CM

## 2018-10-27 DIAGNOSIS — E43 Unspecified severe protein-calorie malnutrition: Secondary | ICD-10-CM | POA: Diagnosis present

## 2018-10-27 DIAGNOSIS — Z882 Allergy status to sulfonamides status: Secondary | ICD-10-CM | POA: Diagnosis not present

## 2018-10-27 DIAGNOSIS — I700 Atherosclerosis of aorta: Secondary | ICD-10-CM | POA: Insufficient documentation

## 2018-10-27 DIAGNOSIS — Z885 Allergy status to narcotic agent status: Secondary | ICD-10-CM | POA: Diagnosis not present

## 2018-10-27 DIAGNOSIS — J90 Pleural effusion, not elsewhere classified: Secondary | ICD-10-CM

## 2018-10-27 DIAGNOSIS — J432 Centrilobular emphysema: Secondary | ICD-10-CM | POA: Insufficient documentation

## 2018-10-27 DIAGNOSIS — I509 Heart failure, unspecified: Secondary | ICD-10-CM

## 2018-10-27 MED ORDER — VITAMIN D (ERGOCALCIFEROL) 1.25 MG (50000 UNIT) PO CAPS: 50000.0000 [IU] | ORAL_CAPSULE | ORAL | Status: DC

## 2018-10-27 MED ORDER — HYDROCODONE-ACETAMINOPHEN 5-325 MG PO TABS
1.0000 | ORAL_TABLET | Freq: Once | ORAL | Status: AC
  Administered 2018-10-27: 1 via ORAL
  Filled 2018-10-27: qty 1

## 2018-10-27 MED ORDER — FERROUS SULFATE 325 (65 FE) MG PO TABS
325.0000 mg | ORAL_TABLET | ORAL | Status: DC
  Administered 2018-10-28 – 2018-10-30 (×2): 325 mg via ORAL
  Filled 2018-10-27 (×3): qty 1

## 2018-10-27 MED ORDER — LOSARTAN POTASSIUM 50 MG PO TABS
50.0000 mg | ORAL_TABLET | Freq: Every day | ORAL | Status: DC
  Administered 2018-10-28: 50 mg via ORAL
  Filled 2018-10-27: qty 1

## 2018-10-27 MED ORDER — ROPINIROLE HCL 1 MG PO TABS
2.0000 mg | ORAL_TABLET | Freq: Every day | ORAL | Status: DC
  Administered 2018-10-27 – 2018-10-29 (×3): 2 mg via ORAL
  Filled 2018-10-27 (×3): qty 2

## 2018-10-27 MED ORDER — FUROSEMIDE 10 MG/ML IJ SOLN
20.0000 mg | Freq: Two times a day (BID) | INTRAMUSCULAR | Status: DC
  Administered 2018-10-28: 20 mg via INTRAVENOUS
  Filled 2018-10-27 (×2): qty 2

## 2018-10-27 MED ORDER — METOPROLOL TARTRATE 25 MG PO TABS
12.5000 mg | ORAL_TABLET | Freq: Two times a day (BID) | ORAL | Status: DC
  Administered 2018-10-27 – 2018-10-30 (×5): 12.5 mg via ORAL
  Filled 2018-10-27 (×5): qty 1

## 2018-10-27 MED ORDER — MORPHINE SULFATE (PF) 2 MG/ML IV SOLN
1.0000 mg | INTRAVENOUS | Status: DC | PRN
  Administered 2018-10-27 – 2018-10-28 (×2): 1 mg via INTRAVENOUS
  Filled 2018-10-27 (×2): qty 1

## 2018-10-27 MED ORDER — FUROSEMIDE 10 MG/ML IJ SOLN
40.0000 mg | Freq: Once | INTRAMUSCULAR | Status: AC
  Administered 2018-10-27: 40 mg via INTRAVENOUS
  Filled 2018-10-27: qty 4

## 2018-10-27 MED ORDER — PHENYLEPHRINE HCL 10 MG PO TABS
10.0000 mg | ORAL_TABLET | ORAL | Status: DC | PRN
  Filled 2018-10-27: qty 1

## 2018-10-27 MED ORDER — FLUTICASONE PROPIONATE 50 MCG/ACT NA SUSP
2.0000 | Freq: Every evening | NASAL | Status: DC | PRN
  Filled 2018-10-27: qty 16

## 2018-10-27 MED ORDER — CLOPIDOGREL BISULFATE 75 MG PO TABS
75.0000 mg | ORAL_TABLET | Freq: Every day | ORAL | Status: DC
  Administered 2018-10-28 – 2018-10-30 (×3): 75 mg via ORAL
  Filled 2018-10-27 (×3): qty 1

## 2018-10-27 MED ORDER — ADULT MULTIVITAMIN W/MINERALS CH
1.0000 | ORAL_TABLET | Freq: Every day | ORAL | Status: DC
  Administered 2018-10-28 – 2018-10-30 (×3): 1 via ORAL
  Filled 2018-10-27 (×3): qty 1

## 2018-10-27 MED ORDER — ACETAMINOPHEN 500 MG PO TABS
500.0000 mg | ORAL_TABLET | Freq: Four times a day (QID) | ORAL | Status: DC | PRN
  Administered 2018-10-28 – 2018-10-30 (×3): 500 mg via ORAL
  Filled 2018-10-27 (×3): qty 1

## 2018-10-27 NOTE — Progress Notes (Addendum)
Pt did pee at 2100 with a volume of 200 ml. Pt is complaining of pee 7 out 10 pain. Notify prime. Will continue to monitor.  Update 2113: Dr. Lindell Noedaimond ordered morphine 1 mg IV every 4 ours as needed for severe pain. Will continue to monitor.  Update 0607: Staff was trying to call for help but pt refused to wait and states " she wants to go to bed". After helping pt to the bed, pt trying to reach her right lower legs. Staff saw that pt was touching a skin tear. Pt skin tear was cleansed and place a new pink foam. Pt have an output of 500 using BSC. Bladder scan showed 150. Will continue to monitor.

## 2018-10-27 NOTE — ED Triage Notes (Signed)
PT was seen by PCP today after mechanical fall last Friday. Pt was told she has fractured ribs and "fluid in my lungs." In triage pt reports lower right abdominal pain.

## 2018-10-27 NOTE — ED Provider Notes (Signed)
[facility] Emergency Department Provider Note ____________________________________________   First MD Initiated Contact with Patient 10/27/18 1616     (approximate)  I have reviewed the triage vital signs and the nursing notes.   HISTORY  Chief Complaint Fall    HPI [name] is a 84 y.o. female with PMH as noted below who presents with right sided chest and abdominal pain over the last several days, gradual onset, and occurring after a fall from standing height 4 days ago.  She initially had minimal pain however the symptoms have worsened over the last several days.  The patient saw her regular doctor earlier today who ordered CT of her chest and abdomen which found multiple rib fractures and a significant pleural effusion.  The patient reports associated shortness of breath.  She denies cough or fever.  She denies vomiting or urinary symptoms.  Past Medical History:  Diagnosis Date  . Atrial fibrillation (HCC)   . Degenerative arthritis   . Degenerative joint disease   . Foot ulcer (HCC)    Chronic left ankle ulcer  . GERD (gastroesophageal reflux disease)   . Hypertension   . Osteoporosis   . PAD (peripheral artery disease) (HCC)    s/p bypass and left leg stents  . Peripheral neuropathy     Patient Active Problem List   Diagnosis Date Noted  . Acute on chronic heart failure (HCC) 10/27/2018  . Degenerative arthritis 07/21/2018  . GERD (gastroesophageal reflux disease) 07/21/2018  . Hypertension 07/21/2018  . Cellulitis of left lower extremity 06/20/2018  . Flexion contracture of joint of lower leg, left 02/27/2017  . Gait instability 02/27/2017  . Traumatic seroma of left lower leg (HCC) 02/27/2017  . Chronic diastolic heart failure (HCC) 11/06/2015  . Bradycardia 11/06/2015  . Atrial fibrillation (HCC) 11/06/2015  . Lump of skin of lower extremity 11/06/2015  . Seasonal allergies 11/06/2015  . Pressure ulcer 10/24/2015  .  Irritable bowel syndrome 06/09/2015  . Osteoporosis 06/09/2015  . Ischemic leg ulcer (HCC) 09/13/2014  . Cellulitis and abscess of foot, except toes 05/25/2014  . Critical lower limb ischemia 05/25/2014  . Pain in limb 12/14/2013  . Ulcer of calf (HCC) 11/10/2013    Past Surgical History:  Procedure Laterality Date  . ABDOMINAL HYSTERECTOMY    . Femoral bypass surgery     left leg  . STENT PLACEMENT ILIAC ([facility] HX)     Left leg  . TONSILLECTOMY  age 25    Prior to Admission medications   Medication Sig Start Date End Date Taking? Authorizing Provider  acetaminophen (TYLENOL) 500 MG tablet Take 500-1,000 mg by mouth every 6 (six) hours as needed for mild pain or moderate pain.   Yes [provider]  clopidogrel (PLAVIX) 75 MG tablet Take 75 mg by mouth daily.   Yes [provider]  ferrous sulfate 325 (65 FE) MG tablet Take 1 tablet (325 mg total) by mouth every Monday, Wednesday, and Friday. 06/24/18  Yes Katha Hamming, MD  fluticasone (FLONASE) 50 MCG/ACT nasal spray Place 2 sprays into both nostrils at bedtime as needed for allergies or rhinitis.    Yes [provider]  furosemide (LASIX) 20 MG tablet Take 20 mg by mouth daily.   Yes [provider]  losartan (COZAAR) 50 MG tablet Take 50 mg by mouth daily.   Yes [provider]  metoprolol tartrate (LOPRESSOR) 25 MG tablet Take 12.5 mg by mouth 2 (two) times daily.  Yes [provider]  Multiple Vitamin (MULTI-VITAMINS) TABS Take 1 tablet by mouth daily.    Yes [provider]  phenylephrine (SUDAFED PE) 10 MG TABS tablet Take 10 mg by mouth every 4 (four) hours as needed (for congestion).   Yes [provider]  rOPINIRole (REQUIP) 2 MG tablet Take 2 mg by mouth at bedtime.   Yes [provider]  Vitamin D, Ergocalciferol, (DRISDOL) 50000 UNITS CAPS capsule Take 50,000 Units by mouth every Tuesday.    Yes [provider]  furosemide  (LASIX) 20 MG tablet Take 1 tablet (20 mg total) by mouth daily. Can use additional 20 mg PO lasix as need for worsening SOB, LEG SWELLING or more than 3 lbs wt gain in a week Patient not taking: Reported on 06/20/2018 10/25/15   Delfino LovettShah, Vipul, MD    Allergies Alendronate; Doxycycline; Minocycline; Oxycodone; and Sulfa antibiotics  Family History  Problem Relation Age of Onset  . Peripheral vascular disease Father     Social History Social History   Tobacco Use  . Smoking status: Former Games developermoker  . Smokeless tobacco: Never Used  . Tobacco comment: Quit 10 years ago  Substance Use Topics  . Alcohol use: No    Alcohol/week: 0.0 standard drinks  . Drug use: Not on file    Review of Systems  Constitutional: No fever. Eyes: No redness. ENT: No neck pain. Cardiovascular: Positive for chest pain. Respiratory: Denies shortness of breath. Gastrointestinal: No vomiting or diarrhea.  Genitourinary: Negative for dysuria.  Musculoskeletal: Negative for back pain. Skin: Negative for rash. Neurological: Negative for headache.   ____________________________________________   PHYSICAL EXAM:  VITAL SIGNS: ED Triage Vitals  Enc Vitals Group     BP 10/27/18 1529 (!) 146/82     Pulse Rate 10/27/18 1529 96     Resp 10/27/18 1529 20     Temp 10/27/18 1529 97.6 F (36.4 C)     Temp Source 10/27/18 1529 Oral     SpO2 10/27/18 1529 94 %     Weight 10/27/18 1530 100 lb (45.4 kg)     Height 10/27/18 1530 4\' 11"  (1.499 m)     Head Circumference --      Peak Flow --      Pain Score 10/27/18 1529 7     Pain Loc --      Pain Edu? --      Excl. in GC? --     Constitutional: Alert and oriented.  Uncomfortable appearing. Eyes: Conjunctivae are normal.  Head: Atraumatic. Nose: No congestion/rhinnorhea. Mouth/Throat: Mucous membranes are slightly dry.   Neck: Normal range of motion.  Cardiovascular: Normal rate, regular rhythm. Grossly normal heart sounds.  Good peripheral  circulation. Respiratory: Tachypneic with somewhat increased respiratory effort.  Decreased breath sounds right posterior lung. Gastrointestinal: Soft with moderate right mid and lower abdominal tenderness. No distention.  Genitourinary: Right flank tenderness. Musculoskeletal: No lower extremity edema.  Extremities warm and well perfused.  Neurologic:  Normal speech and language. No gross focal neurologic deficits are appreciated.  Skin:  Skin is warm and dry. No rash noted. Psychiatric: Anxious appearing. ____________________________________________   LABS (all labs ordered are listed, but only abnormal results are displayed)  Labs Reviewed - No data to display ____________________________________________  EKG  ED ECG REPORT I, Dionne BucySebastian Carlinda Ohlson, the attending physician, personally viewed and interpreted this ECG.  Date: 10/27/2018 EKG Time: 1539 Rate: 91 Rhythm: Atrial fibrillation QRS Axis: normal Intervals: normal ST/T Wave abnormalities: Nonspecific ST  abnormalities lateral and inferior Narrative Interpretation: no evidence of acute ischemia; no significant change when compared to EKG of 10/23/2015  ____________________________________________  RADIOLOGY    ____________________________________________   PROCEDURES  Procedure(s) performed: No  Procedures  Critical Care performed: No ____________________________________________   INITIAL IMPRESSION / ASSESSMENT AND PLAN / ED COURSE  Pertinent labs & imaging results that were available during my care of the patient were reviewed by me and considered in my medical decision making (see chart for details).  84 year old female with PMH as noted above presents for evaluation after being diagnosed with multiple rib fractures and pleural effusion after a fall 4 days ago.  The patient initially felt relatively well but over the last several days her pain and shortness of breath worsened and she went to the doctor  today.  I reviewed the past medical records in epic and the CT results from today.  The patient has 5 consecutive rib fractures on the right, with a significant pleural effusion.  There are no significant traumatic intra-abdominal findings.  On exam the patient is uncomfortable appearing and tachypneic.  Her vital signs are normal.  She has tenderness throughout the right chest, significant pain on movement, and tenderness in the right lower quadrant.  Given the patient's age and frailty, as well as her relatively severe pain and tachypnea and the findings on CT, she will require admission for pain control and pulmonary toilet.  Labs have already been obtained today and are within normal limits.  ----------------------------------------- 6:36 PM on 10/27/2018 -----------------------------------------  The patient's pain is now under control.  I ordered a dose of Lasix to help drain the effusion.  I signed the patient out to the hospitalist Dr. Amado Coe.   ____________________________________________   FINAL CLINICAL IMPRESSION(S) / ED DIAGNOSES  Final diagnoses:  Closed fracture of multiple ribs of right side with routine healing, subsequent encounter  Pleural effusion      NEW MEDICATIONS STARTED DURING THIS VISIT:  New Prescriptions   No medications on file     Note:  This document was prepared using Dragon voice recognition software and may include unintentional dictation errors.    Dionne Bucy, MD 10/27/18 [phone]

## 2018-10-27 NOTE — H&P (Signed)
[facility] - [place] [facility]   PATIENT NAME: [name]    MR#:  [id number]  DATE OF BIRTH:  [birth year]  DATE OF ADMISSION:  10/27/2018  PRIMARY CARE PHYSICIAN: Barbette Reichmann, MD   REQUESTING/REFERRING PHYSICIAN: Dionne Bucy, MD  CHIEF COMPLAINT:   Fall HISTORY OF PRESENT ILLNESS:  [name]  is a 84 y.o. female with a known history of atrial fibrillation, chronic diastolic congestive heart failure seen by Dr. Darrold Span in the past, GERD, essential hypertension and other medical problems is presenting to the ED after she sustained a fall with right-sided chest pain and abdominal pain.  Initially her pain was minimal but that has been progressively gotten worse.  Patient was seen by her primary care physician who has ordered a CT of the chest and abdomen which has revealed multiple rib fractures and right-sided moderate pleural effusion.  Patient is sent over to the hospital as she was short of breath and initially hypoxemic.  With oxygen clinically she improved and IV Lasix was given in the emergency department.  PAST MEDICAL HISTORY:   Past Medical History:  Diagnosis Date  . Atrial fibrillation (HCC)   . Degenerative arthritis   . Degenerative joint disease   . Foot ulcer (HCC)    Chronic left ankle ulcer  . GERD (gastroesophageal reflux disease)   . Hypertension   . Osteoporosis   . PAD (peripheral artery disease) (HCC)    s/p bypass and left leg stents  . Peripheral neuropathy     PAST SURGICAL HISTOIRY:   Past Surgical History:  Procedure Laterality Date  . ABDOMINAL HYSTERECTOMY    . Femoral bypass surgery     left leg  . STENT PLACEMENT ILIAC ([facility] HX)     Left leg  . TONSILLECTOMY  age 31    SOCIAL HISTORY:   Social History   Tobacco Use  . Smoking status: Former Games developer  . Smokeless tobacco: Never Used  . Tobacco comment: Quit 10 years ago  Substance Use Topics  . Alcohol use: No    Alcohol/week: 0.0  standard drinks    FAMILY HISTORY:   Family History  Problem Relation Age of Onset  . Peripheral vascular disease Father     DRUG ALLERGIES:   Allergies  Allergen Reactions  . Alendronate Other (See Comments)    Reaction:  Unknown   . Doxycycline Nausea And Vomiting  . Minocycline Nausea And Vomiting  . Oxycodone Other (See Comments)    Reaction:  Hallucinations   . Sulfa Antibiotics Nausea And Vomiting    REVIEW OF SYSTEMS:  CONSTITUTIONAL: No fever, fatigue or weakness.  EYES: No blurred or double vision.  EARS, NOSE, AND THROAT: No tinnitus or ear pain.  RESPIRATORY: No cough, shortness of breath, wheezing or hemoptysis.  CARDIOVASCULAR: Reporting chest pain, abdominal pain which is getting worse with inspiration, denies orthopnea, edema.  GASTROINTESTINAL: No nausea, vomiting, diarrhea or abdominal pain.  GENITOURINARY: No dysuria, hematuria.  ENDOCRINE: No polyuria, nocturia,  HEMATOLOGY: No anemia, easy bruising or bleeding SKIN: No rash or lesion. MUSCULOSKELETAL: Reporting chest and abdominal pain NEUROLOGIC: No tingling, numbness, weakness.  PSYCHIATRY: No anxiety or depression.   MEDICATIONS AT HOME:   Prior to Admission medications   Medication Sig Start Date End Date Taking? Authorizing Provider  acetaminophen (TYLENOL) 500 MG tablet Take 500-1,000 mg by mouth every 6 (six) hours as needed for mild pain or moderate pain.   Yes [provider]  clopidogrel (PLAVIX) 75  MG tablet Take 75 mg by mouth daily.   Yes [provider]  ferrous sulfate 325 (65 FE) MG tablet Take 1 tablet (325 mg total) by mouth every Monday, Wednesday, and Friday. 06/24/18  Yes Katha HammingKonidena, Snehalatha, MD  fluticasone (FLONASE) 50 MCG/ACT nasal spray Place 2 sprays into both nostrils at bedtime as needed for allergies or rhinitis.    Yes [provider]  furosemide (LASIX) 20 MG tablet Take 20 mg by mouth daily.   Yes [provider]  losartan (COZAAR)  50 MG tablet Take 50 mg by mouth daily.   Yes [provider]  metoprolol tartrate (LOPRESSOR) 25 MG tablet Take 12.5 mg by mouth 2 (two) times daily.    Yes [provider]  Multiple Vitamin (MULTI-VITAMINS) TABS Take 1 tablet by mouth daily.    Yes [provider]  phenylephrine (SUDAFED PE) 10 MG TABS tablet Take 10 mg by mouth every 4 (four) hours as needed (for congestion).   Yes [provider]  rOPINIRole (REQUIP) 2 MG tablet Take 2 mg by mouth at bedtime.   Yes [provider]  Vitamin D, Ergocalciferol, (DRISDOL) 50000 UNITS CAPS capsule Take 50,000 Units by mouth every Tuesday.    Yes [provider]  furosemide (LASIX) 20 MG tablet Take 1 tablet (20 mg total) by mouth daily. Can use additional 20 mg PO lasix as need for worsening SOB, LEG SWELLING or more than 3 lbs wt gain in a week Patient not taking: Reported on 06/20/2018 10/25/15   Delfino LovettShah, Vipul, MD      VITAL SIGNS:  Blood pressure (!) 162/99, pulse 86, temperature 97.6 F (36.4 C), temperature source Oral, resp. rate 18, height 4\' 11"  (1.499 m), weight 45.4 kg, SpO2 92 %.  PHYSICAL EXAMINATION:  GENERAL:  84 y.o.-year-old patient lying in the bed with no acute distress.  EYES: Pupils equal, round, reactive to light and accommodation. No scleral icterus. Extraocular muscles intact.  HEENT: Head atraumatic, normocephalic. Oropharynx and nasopharynx clear.  NECK:  Supple, no jugular venous distention. No thyroid enlargement, no tenderness.  LUNGS: Normal breath sounds bilaterally, no wheezing, rales,rhonchi or crepitation. No use of accessory muscles of respiration.  CARDIOVASCULAR: S1, S2 normal. No murmurs, rubs, or gallops. Right anterior chest wall is tender ABDOMEN: Soft, tender on the right side, no rebound tenderness nondistended. Bowel sounds present.   EXTREMITIES: No pedal edema, cyanosis, or clubbing.  NEUROLOGIC: Patient is awake alert and oriented x3 sensation  intact. Gait not checked.  PSYCHIATRIC: The patient is alert and oriented x 3.  SKIN: No obvious rash, lesion, or ulcer.   LABORATORY PANEL:   CBC No results for input(s): WBC, HGB, HCT, PLT in the last 168 hours. ------------------------------------------------------------------------------------------------------------------  Chemistries  No results for input(s): NA, K, CL, CO2, GLUCOSE, BUN, CREATININE, CALCIUM, MG, AST, ALT, ALKPHOS, BILITOT in the last 168 hours.  Invalid input(s): GFRCGP ------------------------------------------------------------------------------------------------------------------  Cardiac Enzymes No results for input(s): TROPONINI in the last 168 hours. ------------------------------------------------------------------------------------------------------------------  RADIOLOGY:  Ct Abdomen Pelvis Wo Contrast  Result Date: 10/27/2018 CLINICAL DATA:  Larey SeatFell at home several days ago with right-sided chest wall and right upper quadrant pain, some shortness of breath EXAM: CT CHEST, ABDOMEN AND PELVIS WITHOUT CONTRAST TECHNIQUE: Multidetector CT imaging of the chest, abdomen and pelvis was performed following the standard protocol without IV contrast. COMPARISON:  Portable chest x-ray of 06/23/2017 FINDINGS: CT CHEST FINDINGS Cardiovascular: There is moderate cardiomegaly present. No pericardial effusion is seen. Coronary artery calcifications are  noted primarily within the distribution of the left anterior descending coronary artery. Moderate thoracic aortic atherosclerosis is present. The pulmonary artery segments are somewhat prominent which may indicate a degree of pulmonary arterial hypertension. Mediastinum/Nodes: No mediastinal or hilar adenopathy is seen. Thyroid gland is unremarkable. No hiatal hernia is noted. Lungs/Pleura: The lungs are hyperaerated with probable centrilobular emphysema. No pneumonia is seen and no pneumothorax is noted. However, there is a  moderate size right pleural effusion present with with associated basilar atelectasis. Musculoskeletal: On bone window images are fractures involving the right sixth, seventh, eighth, ninth, and tenth ribs laterally. CT ABDOMEN PELVIS FINDINGS Hepatobiliary: The liver is unremarkable in the unenhanced state. No calcified gallstones are seen. Pancreas: The pancreas is difficult to visualize with very little fat present but no mass or ductal dilatation is noted. Spleen: The spleen is unremarkable and intact. Adrenals/Urinary Tract: The adrenal glands are unremarkable. There is slight fullness of both pelvocaliceal system some of which may be due to parapelvic cysts, but mild hydronephrosis cannot be excluded on this study without contrast media. There are small renal calcifications present which may represent calculi or renovascular calcifications. The ureters are difficult to visualize but no dilatation is seen. The urinary bladder is moderately urine distended with no abnormality noted. Stomach/Bowel: The stomach is largely decompressed. No small bowel dilatation or edema is seen. Much of the colon is air-filled and there is a moderate amount of feces present throughout the colon. No abnormality is noted within the right lower quadrant. Vascular/Lymphatic: Significant abdominal aortic atherosclerosis is present. The distal abdominal aorta measures 2.6 cm in maximum diameter. No adenopathy is seen. Reproductive: The uterus is not definitely seen and may be atrophic or possibly previously resected but that history is not given. No free fluid is seen within the pelvis. No adnexal lesion is seen. Other: No abdominal wall hernia is seen. Musculoskeletal: There is a thoracolumbar kyphoscoliosis present with associated degenerative change. No compression deformity is seen. IMPRESSION: 1. Moderate sized right pleural effusion with adjacent fractures involving the anterolateral right sixth, seventh, eighth, ninth, and tenth  ribs. No pneumothorax. 2. Slight fullness of the pelvocaliceal systems may be due to parapelvic renal cysts. Mild hydronephrosis cannot be excluded. 3. Significant thoracic and abdominal aortic atherosclerosis and diffuse coronary artery disease. 4. Thoracolumbar kyphoscoliosis with associated degenerative change. 5. Centrilobular emphysema. Electronically Signed   By: Dwyane Dee M.D.   On: 10/27/2018 14:34   Ct Chest Wo Contrast  Result Date: 10/27/2018 CLINICAL DATA:  Larey Seat at home several days ago with right-sided chest wall and right upper quadrant pain, some shortness of breath EXAM: CT CHEST, ABDOMEN AND PELVIS WITHOUT CONTRAST TECHNIQUE: Multidetector CT imaging of the chest, abdomen and pelvis was performed following the standard protocol without IV contrast. COMPARISON:  Portable chest x-ray of 06/23/2017 FINDINGS: CT CHEST FINDINGS Cardiovascular: There is moderate cardiomegaly present. No pericardial effusion is seen. Coronary artery calcifications are noted primarily within the distribution of the left anterior descending coronary artery. Moderate thoracic aortic atherosclerosis is present. The pulmonary artery segments are somewhat prominent which may indicate a degree of pulmonary arterial hypertension. Mediastinum/Nodes: No mediastinal or hilar adenopathy is seen. Thyroid gland is unremarkable. No hiatal hernia is noted. Lungs/Pleura: The lungs are hyperaerated with probable centrilobular emphysema. No pneumonia is seen and no pneumothorax is noted. However, there is a moderate size right pleural effusion present with with associated basilar atelectasis. Musculoskeletal: On bone window images are fractures involving the right sixth, seventh, eighth, ninth,  and tenth ribs laterally. CT ABDOMEN PELVIS FINDINGS Hepatobiliary: The liver is unremarkable in the unenhanced state. No calcified gallstones are seen. Pancreas: The pancreas is difficult to visualize with very little fat present but no mass  or ductal dilatation is noted. Spleen: The spleen is unremarkable and intact. Adrenals/Urinary Tract: The adrenal glands are unremarkable. There is slight fullness of both pelvocaliceal system some of which may be due to parapelvic cysts, but mild hydronephrosis cannot be excluded on this study without contrast media. There are small renal calcifications present which may represent calculi or renovascular calcifications. The ureters are difficult to visualize but no dilatation is seen. The urinary bladder is moderately urine distended with no abnormality noted. Stomach/Bowel: The stomach is largely decompressed. No small bowel dilatation or edema is seen. Much of the colon is air-filled and there is a moderate amount of feces present throughout the colon. No abnormality is noted within the right lower quadrant. Vascular/Lymphatic: Significant abdominal aortic atherosclerosis is present. The distal abdominal aorta measures 2.6 cm in maximum diameter. No adenopathy is seen. Reproductive: The uterus is not definitely seen and may be atrophic or possibly previously resected but that history is not given. No free fluid is seen within the pelvis. No adnexal lesion is seen. Other: No abdominal wall hernia is seen. Musculoskeletal: There is a thoracolumbar kyphoscoliosis present with associated degenerative change. No compression deformity is seen. IMPRESSION: 1. Moderate sized right pleural effusion with adjacent fractures involving the anterolateral right sixth, seventh, eighth, ninth, and tenth ribs. No pneumothorax. 2. Slight fullness of the pelvocaliceal systems may be due to parapelvic renal cysts. Mild hydronephrosis cannot be excluded. 3. Significant thoracic and abdominal aortic atherosclerosis and diffuse coronary artery disease. 4. Thoracolumbar kyphoscoliosis with associated degenerative change. 5. Centrilobular emphysema. Electronically Signed   By: Dwyane Dee M.D.   On: 10/27/2018 14:34    EKG:   Orders  placed or performed during the hospital encounter of 10/27/18  . ED EKG  . ED EKG    IMPRESSION AND PLAN:   84 year old pleasant female patient came to the ED after she sustained a fall patient was reporting right-sided chest and abdominal pain CT of her chest and abdomen has revealed multiple rib fractures with right-sided pleural effusion.  #Acute on chronic diastolic CHF with moderate right-sided pleural effusion Admit to telemetry Lasix IV every 12 hours and monitor intake and output and daily weights If no significant clinical improvement patient needs interventional radiology consult for thoracentesis Continue home medication metoprolol, Cozaar, baby aspirin We will get an echocardiogram Patient was seen by Dr. Juliann Pares in the past will place cardiology consult to Dr. Lady Gary  #Multiple right-sided rib fractures following a fall Pain management as needed PT assessment   #Chronic atrial fibrillation rate controlled Continue home medication metoprolol and aspirin  #GERD PPI  #Possible mild hydronephrosis CT abdomen finding which cannot be ruled out We will place a Foley catheter and get a renal ultrasound in a.m., if hydronephrosis is confirmed we will put a urology consult will leave that to rounding physician's discretion     All the records are reviewed and case discussed with ED provider. Management plans discussed with the patient, family and they are in agreement.  CODE STATUS: DNR  TOTAL TIME TAKING CARE OF THIS PATIENT: 45 minutes.   Note: This dictation was prepared with Dragon dictation along with smaller phrase technology. Any transcriptional errors that result from this process are unintentional.  Ramonita Lab M.D on 10/27/2018 at 7:30  PM  Between 7am to 6pm - Pager - [phone]  After 6pm go to [email or web address] - password [email or web address] [facility]  [facility]  Office  [phone]  CC: Primary care physician; Barbette Reichmann, MD

## 2018-10-27 NOTE — Progress Notes (Signed)
Family Meeting Note  Advance Directive:yes  Today a meeting took place with the Patient,daughter at bed side     The following clinical team members were present during this meeting:MD  The following were discussed:Patient's diagnosis: Acute on chronic CHF, moderate right-sided pleural effusion with multiple fractured ribs, chronic atrial for ablation, GERD, essential hypertension mild hydronephrosis cannot be ruled out, treatment plan of care discussed in detail with the patient and her daughter at bedside.  They both verbalized understanding of the plan   patient's progosis: Unable to determine and Goals for treatment: DNR Daughter Valda LambBrenda Lindley is the healthcare power of attorney Additional follow-up to be provided: Hospitalist, cardiology  Time spent during discussion:18 min  Ramonita LabAruna Emmarose Klinke, MD

## 2018-10-27 NOTE — ED Notes (Signed)
Pt bladder scanned at this time. Bladder scan resulted at 453 mL.

## 2018-10-27 NOTE — ED Notes (Signed)
FIRST NURSE NOTE:  Pt from CT, had CT scan that showed rib fractures and possibly fluid in her lungs. Pt sent here by Mahalia Longestob Tumey.

## 2018-10-28 ENCOUNTER — Inpatient Hospital Stay: Payer: Medicare Other

## 2018-10-28 ENCOUNTER — Inpatient Hospital Stay
Admit: 2018-10-28 | Discharge: 2018-10-28 | Disposition: A | Discharge status: Home / Self Care | Payer: Medicare Other | Attending: Internal Medicine | Admitting: Internal Medicine

## 2018-10-28 DIAGNOSIS — E43 Unspecified severe protein-calorie malnutrition: Secondary | ICD-10-CM

## 2018-10-28 LAB — CBC
HCT: 42.8 % (ref 36.0–46.0)
HEMOGLOBIN: 14.1 g/dL (ref 12.0–15.0)
MCH: 31.8 pg (ref 26.0–34.0)
MCHC: 32.9 g/dL (ref 30.0–36.0)
MCV: 96.6 fL (ref 80.0–100.0)
PLATELETS: 170 10*3/uL (ref 150–400)
RBC: 4.43 MIL/uL (ref 3.87–5.11)
RDW: 13.8 % (ref 11.5–15.5)
WBC: 4.9 10*3/uL (ref 4.0–10.5)
nRBC: 0.4 % — ABNORMAL HIGH (ref 0.0–0.2)

## 2018-10-28 LAB — BASIC METABOLIC PANEL
ANION GAP: 8 (ref 5–15)
BUN: 22 mg/dL (ref 8–23)
CALCIUM: 8.7 mg/dL — AB (ref 8.9–10.3)
CO2: 28 mmol/L (ref 22–32)
Chloride: 102 mmol/L (ref 98–111)
Creatinine, Ser: 1.07 mg/dL — ABNORMAL HIGH (ref 0.44–1.00)
GFR, EST AFRICAN AMERICAN: 53 mL/min — AB (ref >60)
GFR, EST NON AFRICAN AMERICAN: 45 mL/min — AB (ref >60)
GLUCOSE: 99 mg/dL (ref 70–99)
Potassium: 3.6 mmol/L (ref 3.5–5.1)
Sodium: 138 mmol/L (ref 135–145)

## 2018-10-28 LAB — ECHOCARDIOGRAM COMPLETE
HEIGHTINCHES: 59 in
Weight: 1444.8 oz

## 2018-10-28 MED ORDER — ENOXAPARIN SODIUM 30 MG/0.3ML ~~LOC~~ SOLN
30.0000 mg | SUBCUTANEOUS | Status: DC
  Administered 2018-10-28 – 2018-10-29 (×2): 30 mg via SUBCUTANEOUS
  Filled 2018-10-28 (×2): qty 0.3

## 2018-10-28 MED ORDER — PRO-STAT SUGAR FREE PO LIQD
30.0000 mL | Freq: Two times a day (BID) | ORAL | Status: DC
  Administered 2018-10-28 – 2018-10-29 (×3): 30 mL via ORAL

## 2018-10-28 MED ORDER — ALUM & MAG HYDROXIDE-SIMETH 200-200-20 MG/5ML PO SUSP
15.0000 mL | Freq: Four times a day (QID) | ORAL | Status: DC | PRN
  Administered 2018-10-28: 15 mL via ORAL
  Filled 2018-10-28: qty 30

## 2018-10-28 MED ORDER — ENSURE ENLIVE PO LIQD
237.0000 mL | Freq: Two times a day (BID) | ORAL | Status: DC
  Administered 2018-10-28 – 2018-10-30 (×3): 237 mL via ORAL

## 2018-10-28 MED ORDER — HYDROCODONE-ACETAMINOPHEN 5-325 MG PO TABS
1.0000 | ORAL_TABLET | Freq: Four times a day (QID) | ORAL | Status: DC | PRN
  Administered 2018-10-28 – 2018-10-30 (×4): 1 via ORAL
  Filled 2018-10-28 (×5): qty 1

## 2018-10-28 NOTE — Progress Notes (Signed)
Talked to Dr. Elpidio AnisSudini about patient's BP of 98/60 is scheduled to received 20 IV lasix, patient received Losartan, metoprolol and IV lasix this morning, order to hold per MD. RN will continue to monitor.

## 2018-10-28 NOTE — Progress Notes (Signed)
Talked to Dr. Anne HahnWillis about patient's BP of 91/54 patient is to received 12.5 mg metoprolol, order to hold dose. RN will continue to monitor.

## 2018-10-28 NOTE — Progress Notes (Addendum)
[facility] - [place] [facility]   PATIENT NAME: [name]    MR#:  [id number]  DATE OF BIRTH:  02-24-1925  SUBJECTIVE:  Patient states she is not having much pain this morning. Denies shortness of breath, but states that she hasn't been moving around at all.   REVIEW OF SYSTEMS:  Review of Systems  Constitutional: Negative for chills and fever.  HENT: Negative for congestion and sore throat.   Eyes: Negative for blurred vision and double vision.  Respiratory: Negative for cough and shortness of breath.   Cardiovascular: Negative for chest pain and palpitations.  Gastrointestinal: Negative for nausea and vomiting.  Genitourinary: Negative for dysuria and urgency.  Musculoskeletal: Positive for falls. Negative for neck pain.  Neurological: Negative for dizziness and headaches.  Psychiatric/Behavioral: Negative for depression. The patient is not nervous/anxious.     DRUG ALLERGIES:   Allergies  Allergen Reactions  . Alendronate Other (See Comments)    Reaction:  Unknown   . Doxycycline Nausea And Vomiting  . Minocycline Nausea And Vomiting  . Oxycodone Other (See Comments)    Reaction:  Hallucinations   . Sulfa Antibiotics Nausea And Vomiting   VITALS:  Blood pressure 124/76, pulse 91, temperature (!) 97.5 F (36.4 C), temperature source Oral, resp. rate 14, height 4\' 11"  (1.499 m), weight 41 kg, SpO2 97 %. PHYSICAL EXAMINATION:  Physical Exam  GENERAL:  84 y.o.-year-old patient lying in the bed with no acute distress.  EYES: Pupils equal, round, reactive to light and accommodation. No scleral icterus. Extraocular muscles intact.  HEENT: Head atraumatic, normocephalic. Oropharynx and nasopharynx clear. Moist mucous membranes.  NECK:  Supple, no jugular venous distention. No thyroid enlargement, no tenderness.  LUNGS: +diminished breath sounds in the right lung base, no wheezing, rales,rhonchi or crepitation. No use of accessory muscles of  respiration. Gibbon in place. CARDIOVASCULAR: RRR, S1, S2 normal. No murmurs, rubs, or gallops. +tenderness to palpation of the right side of the chest ABDOMEN: Soft, tender on the right side, no rebound tenderness, nondistended. Bowel sounds present.   EXTREMITIES: No pedal edema, cyanosis, or clubbing.  NEUROLOGIC: No focal deficits, sensation intact. Gait not checked. +global weakness. PSYCHIATRIC: The patient is alert and oriented x 3.  SKIN: No obvious rash, lesion, or ulcer. LABORATORY PANEL:  Female CBC Recent Labs  Lab 10/28/18 0317  WBC 4.9  HGB 14.1  HCT 42.8  PLT 170   ------------------------------------------------------------------------------------------------------------------ Chemistries  Recent Labs  Lab 10/28/18 0317  NA 138  K 3.6  CL 102  CO2 28  GLUCOSE 99  BUN 22  CREATININE 1.07*  CALCIUM 8.7*   RADIOLOGY:  Ct Abdomen Pelvis Wo Contrast  Result Date: 10/27/2018 CLINICAL DATA:  Larey Seat at home several days ago with right-sided chest wall and right upper quadrant pain, some shortness of breath EXAM: CT CHEST, ABDOMEN AND PELVIS WITHOUT CONTRAST TECHNIQUE: Multidetector CT imaging of the chest, abdomen and pelvis was performed following the standard protocol without IV contrast. COMPARISON:  Portable chest x-ray of 06/23/2017 FINDINGS: CT CHEST FINDINGS Cardiovascular: There is moderate cardiomegaly present. No pericardial effusion is seen. Coronary artery calcifications are noted primarily within the distribution of the left anterior descending coronary artery. Moderate thoracic aortic atherosclerosis is present. The pulmonary artery segments are somewhat prominent which may indicate a degree of pulmonary arterial hypertension. Mediastinum/Nodes: No mediastinal or hilar adenopathy is seen. Thyroid gland is unremarkable. No hiatal hernia is noted. Lungs/Pleura: The lungs are hyperaerated with probable centrilobular emphysema. No  pneumonia is seen and no pneumothorax is  noted. However, there is a moderate size right pleural effusion present with with associated basilar atelectasis. Musculoskeletal: On bone window images are fractures involving the right sixth, seventh, eighth, ninth, and tenth ribs laterally. CT ABDOMEN PELVIS FINDINGS Hepatobiliary: The liver is unremarkable in the unenhanced state. No calcified gallstones are seen. Pancreas: The pancreas is difficult to visualize with very little fat present but no mass or ductal dilatation is noted. Spleen: The spleen is unremarkable and intact. Adrenals/Urinary Tract: The adrenal glands are unremarkable. There is slight fullness of both pelvocaliceal system some of which may be due to parapelvic cysts, but mild hydronephrosis cannot be excluded on this study without contrast media. There are small renal calcifications present which may represent calculi or renovascular calcifications. The ureters are difficult to visualize but no dilatation is seen. The urinary bladder is moderately urine distended with no abnormality noted. Stomach/Bowel: The stomach is largely decompressed. No small bowel dilatation or edema is seen. Much of the colon is air-filled and there is a moderate amount of feces present throughout the colon. No abnormality is noted within the right lower quadrant. Vascular/Lymphatic: Significant abdominal aortic atherosclerosis is present. The distal abdominal aorta measures 2.6 cm in maximum diameter. No adenopathy is seen. Reproductive: The uterus is not definitely seen and may be atrophic or possibly previously resected but that history is not given. No free fluid is seen within the pelvis. No adnexal lesion is seen. Other: No abdominal wall hernia is seen. Musculoskeletal: There is a thoracolumbar kyphoscoliosis present with associated degenerative change. No compression deformity is seen. IMPRESSION: 1. Moderate sized right pleural effusion with adjacent fractures involving the anterolateral right sixth,  seventh, eighth, ninth, and tenth ribs. No pneumothorax. 2. Slight fullness of the pelvocaliceal systems may be due to parapelvic renal cysts. Mild hydronephrosis cannot be excluded. 3. Significant thoracic and abdominal aortic atherosclerosis and diffuse coronary artery disease. 4. Thoracolumbar kyphoscoliosis with associated degenerative change. 5. Centrilobular emphysema. Electronically Signed   By: Dwyane Dee M.D.   On: 10/27/2018 14:34   Ct Chest Wo Contrast  Result Date: 10/27/2018 CLINICAL DATA:  Larey Seat at home several days ago with right-sided chest wall and right upper quadrant pain, some shortness of breath EXAM: CT CHEST, ABDOMEN AND PELVIS WITHOUT CONTRAST TECHNIQUE: Multidetector CT imaging of the chest, abdomen and pelvis was performed following the standard protocol without IV contrast. COMPARISON:  Portable chest x-ray of 06/23/2017 FINDINGS: CT CHEST FINDINGS Cardiovascular: There is moderate cardiomegaly present. No pericardial effusion is seen. Coronary artery calcifications are noted primarily within the distribution of the left anterior descending coronary artery. Moderate thoracic aortic atherosclerosis is present. The pulmonary artery segments are somewhat prominent which may indicate a degree of pulmonary arterial hypertension. Mediastinum/Nodes: No mediastinal or hilar adenopathy is seen. Thyroid gland is unremarkable. No hiatal hernia is noted. Lungs/Pleura: The lungs are hyperaerated with probable centrilobular emphysema. No pneumonia is seen and no pneumothorax is noted. However, there is a moderate size right pleural effusion present with with associated basilar atelectasis. Musculoskeletal: On bone window images are fractures involving the right sixth, seventh, eighth, ninth, and tenth ribs laterally. CT ABDOMEN PELVIS FINDINGS Hepatobiliary: The liver is unremarkable in the unenhanced state. No calcified gallstones are seen. Pancreas: The pancreas is difficult to visualize with  very little fat present but no mass or ductal dilatation is noted. Spleen: The spleen is unremarkable and intact. Adrenals/Urinary Tract: The adrenal glands are unremarkable. There is slight fullness  of both pelvocaliceal system some of which may be due to parapelvic cysts, but mild hydronephrosis cannot be excluded on this study without contrast media. There are small renal calcifications present which may represent calculi or renovascular calcifications. The ureters are difficult to visualize but no dilatation is seen. The urinary bladder is moderately urine distended with no abnormality noted. Stomach/Bowel: The stomach is largely decompressed. No small bowel dilatation or edema is seen. Much of the colon is air-filled and there is a moderate amount of feces present throughout the colon. No abnormality is noted within the right lower quadrant. Vascular/Lymphatic: Significant abdominal aortic atherosclerosis is present. The distal abdominal aorta measures 2.6 cm in maximum diameter. No adenopathy is seen. Reproductive: The uterus is not definitely seen and may be atrophic or possibly previously resected but that history is not given. No free fluid is seen within the pelvis. No adnexal lesion is seen. Other: No abdominal wall hernia is seen. Musculoskeletal: There is a thoracolumbar kyphoscoliosis present with associated degenerative change. No compression deformity is seen. IMPRESSION: 1. Moderate sized right pleural effusion with adjacent fractures involving the anterolateral right sixth, seventh, eighth, ninth, and tenth ribs. No pneumothorax. 2. Slight fullness of the pelvocaliceal systems may be due to parapelvic renal cysts. Mild hydronephrosis cannot be excluded. 3. Significant thoracic and abdominal aortic atherosclerosis and diffuse coronary artery disease. 4. Thoracolumbar kyphoscoliosis with associated degenerative change. 5. Centrilobular emphysema. Electronically Signed   By: Dwyane DeePaul  Barry M.D.   On:  10/27/2018 14:34   [place] Renal  Result Date: 10/28/2018 CLINICAL DATA:  84 year old female with hydronephrosis EXAM: RENAL / URINARY TRACT ULTRASOUND COMPLETE COMPARISON:  CT Abdomen and Pelvis 10/27/2018, 09/17/2016 FINDINGS: Right Kidney: Renal measurements: 7.7 x 3.2 x 3.9 centimeters = volume: 50 mL. Echogenic right renal cortex (image 5) with no hydronephrosis or right renal mass. Rather, extrarenal pelvis is suspected and this appears similar to the 2017 CT appearance. Left Kidney: Renal measurements: 8.0 x 3.4 x 3.7 centimeters = volume: 51 mL. Left renal cortical echogenicity is probably also increased. No left hydronephrosis or renal mass. Mild left extrarenal pelvis also appears stable since 2017. Bladder: Negative aside from suggestion of multiple bladder diverticula (image 51). Both ureteral jets are detected with Doppler. IMPRESSION: 1. Negative for hydronephrosis. Rather, right greater than left extrarenal pelvis is suspected (normal variant) and appears stable since 2017. 2. Possible bladder diverticula. Electronically Signed   By: Odessa FlemingH  Hall M.D.   On: 10/28/2018 11:18   ASSESSMENT AND PLAN:   #Acute on chronic diastolic CHF with moderate right-sided pleural effusion Continue Lasix IV every 12 hours I/O and daily weights Check CXR tomorrow morning, if no improvement in pleural effusion, can consider thoracentesis Continue home metoprolol and baby aspirin Losartan stopped per daughter's request (has not been on this since 08/2018) ECHO pending Cardiology consulted Wean O2 as able  #Multiple right-sided rib fractures following a fall Pain management as needed PT eval pending Incentive spirometry  #Chronic atrial fibrillation- rate controlled Continue metoprolol and aspirin  #Chronic left ankle ulcer- follows with wound clinic. No signs of infection. Local wound care  #GERD- stable Continue PPI  #Possible mild hydronephrosis- seen on CT abdomen Renal US negative for  hydronephrosis  All the records are reviewed and case discussed with Care Management/Social Worker. Management plans discussed with the patient, family and they are in agreement.  CODE STATUS: DNR  TOTAL TIME TAKING CARE OF THIS PATIENT: 35 minutes.   More than 50% of the time was  spent in counseling/coordination of care: YES  POSSIBLE D/C IN 2-3 DAYS, DEPENDING ON CLINICAL CONDITION.   Jinny Blossom Maloree Uplinger M.D on 10/28/2018 at 1:20 PM  Between 7am to 6pm - Pager - [phone]  After 6pm go to [email or web address] - Social research officer, government  [facility]  Office  [phone]  CC: Primary care physician; Barbette Reichmann, MD  Note: This dictation was prepared with Dragon dictation along with smaller phrase technology. Any transcriptional errors that result from this process are unintentional.

## 2018-10-28 NOTE — Consult Note (Signed)
WOC Nurse wound consult note Reason for Consult: Nonhealing vascular wound to left lateral malleolus. Wound type: Pressure Injury POA: Yes/No/NA Measurement: per MD notes:  1.5 cm x 2.6 cm x 0.1 cm per wound care notes from 10/16/18  Daughter at bedside and changes this every other day.  Was last changed yesterday. Wishes for it to be changed tomorrow.  Supplies are provided.  Silicone contact layer, ABD pad and kerlix/tape.  Wound bed:not assessed Drainage (amount, consistency, odor) minimal serosanguinous  No odor Periwound:not assessed Dressing procedure/placement/frequency: Cleanse wound with NS.  Apply silicone contact layer to wound bed.  Cover with ABD pad and kerlix/tape. Change every other day by daughter/home caregiver  Will not follow at this time.  Please re-consult if needed.  Maple HudsonKaren Javarus Dorner MSN, RN, FNP-BC CWON Wound, Ostomy, Continence Nurse Pager [phone]

## 2018-10-28 NOTE — Progress Notes (Signed)
Patient with chronic wound on L foot.  Daughter changes foot wound dressing every other day.  Wound care nurse in to see patient. Daughter does not want staff changing dressing.  Supplies left in room and daughter will change dressing tomorrow.  This RN unable to assess wound due to change not being done today.

## 2018-10-28 NOTE — Consult Note (Signed)
[facility]  CARDIOLOGY CONSULT NOTE  Patient ID: Georgeann OppenheimGenelle T Parilla MRN: [id number] DOB/AGE: 82/04/1925 82 y.o.  Admit date: 10/27/2018 Referring Physician Dr. Amado CoeGouru Primary Physician Dr. Marcello FennelHande Primary Cardiologist Dr. Juliann Paresallwood Reason for Consultation Acute on chronic CHF  HPI: Ms. Cheree DittoGraham is a 84 year old female with a past medical history significant for atrial fibrillation, chronic diastolic heart failure, peripheral vascular disease, and hypertension who presented to the ED on 10/27/18 with right sided chest pain and abdominal pain.  Ms. Cheree DittoGraham reports that she fell in her bathroom last Friday, but didn't seek medical attention immediately because of minimal pain.  Over the past few days, pain had increased, which warranted a trip to her PCP.  CT of chest and abdomen revealed several rib fractures and a right pleural effusion.  Ms. Cheree DittoGraham reports generalized chest pain, abdominal pain, and difficulty breathing due to pain.  Denies any pain or shortness of breath prior to mechanical fall.  Denies palpitations or lower extremity swelling.    [facility]. Most recent echocardiogram on 07/17/17 revealed normal LV function with an EF estimated between 50% and 55%. Moderate to severe TR, mild MR.  Severe right ventricle and right atrial enlargement.   Review of systems complete and found to be negative unless listed above     Past Medical History:  Diagnosis Date  . Atrial fibrillation (HCC)   . Degenerative arthritis   . Degenerative joint disease   . Foot ulcer (HCC)    Chronic left ankle ulcer  . GERD (gastroesophageal reflux disease)   . Hypertension   . Osteoporosis   . PAD (peripheral artery disease) (HCC)    s/p bypass and left leg stents  . Peripheral neuropathy     Past Surgical History:  Procedure Laterality Date  . ABDOMINAL HYSTERECTOMY    . Femoral bypass surgery     left leg  . STENT PLACEMENT ILIAC ([facility] HX)     Left leg  .  TONSILLECTOMY  age 53    Medications Prior to Admission  Medication Sig Dispense Refill Last Dose  . acetaminophen (TYLENOL) 500 MG tablet Take 500-1,000 mg by mouth every 6 (six) hours as needed for mild pain or moderate pain.   Past Week at PRN  . clopidogrel (PLAVIX) 75 MG tablet Take 75 mg by mouth daily.   10/27/2018 at 0800  . ferrous sulfate 325 (65 FE) MG tablet Take 1 tablet (325 mg total) by mouth every Monday, Wednesday, and Friday. 60 tablet 0 10/26/2018 at 0800  . fluticasone (FLONASE) 50 MCG/ACT nasal spray Place 2 sprays into both nostrils at bedtime as needed for allergies or rhinitis.    30+ days at PRN  . furosemide (LASIX) 20 MG tablet Take 20 mg by mouth daily.   10/27/2018 at 0800  . losartan (COZAAR) 50 MG tablet Take 50 mg by mouth daily.   10/27/2018 at 0800  . metoprolol tartrate (LOPRESSOR) 25 MG tablet Take 12.5 mg by mouth 2 (two) times daily.    10/27/2018 at 0800  . Multiple Vitamin (MULTI-VITAMINS) TABS Take 1 tablet by mouth daily.    10/27/2018 at 0800  . phenylephrine (SUDAFED PE) 10 MG TABS tablet Take 10 mg by mouth every 4 (four) hours as needed (for congestion).   30+ days at PRN  . rOPINIRole (REQUIP) 2 MG tablet Take 2 mg by mouth at bedtime.   10/26/2018 at 2000  . Vitamin D, Ergocalciferol, (DRISDOL) 50000 UNITS  CAPS capsule Take 50,000 Units by mouth every Tuesday.    10/27/2018 at 0800  . furosemide (LASIX) 20 MG tablet Take 1 tablet (20 mg total) by mouth daily. Can use additional 20 mg PO lasix as need for worsening SOB, LEG SWELLING or more than 3 lbs wt gain in a week (Patient not taking: Reported on 06/20/2018) 30 tablet 0 Not Taking at Unknown time   Social History   Socioeconomic History  . Marital status: Widowed    Spouse name: Not on file  . Number of children: Not on file  . Years of education: Not on file  . Highest education level: Not on file  Occupational History  . Not on file  Social Needs  . Financial resource strain: Not on file   . Food insecurity:    Worry: Not on file    Inability: Not on file  . Transportation needs:    Medical: Not on file    Non-medical: Not on file  Tobacco Use  . Smoking status: Former Games developer  . Smokeless tobacco: Never Used  . Tobacco comment: Quit 10 years ago  Substance and Sexual Activity  . Alcohol use: No    Alcohol/week: 0.0 standard drinks  . Drug use: Not on file  . Sexual activity: Not on file  Lifestyle  . Physical activity:    Days per week: Not on file    Minutes per session: Not on file  . Stress: Not on file  Relationships  . Social connections:    Talks on phone: Not on file    Gets together: Not on file    Attends religious service: Not on file    Active member of club or organization: Not on file    Attends meetings of clubs or organizations: Not on file    Relationship status: Not on file  . Intimate partner violence:    Fear of current or ex partner: Not on file    Emotionally abused: Not on file    Physically abused: Not on file    Forced sexual activity: Not on file  Other Topics Concern  . Not on file  Social History Narrative   Left-sided home independently. Walks with a cane, also has a walker. Daughter lives right across her.    Family History  Problem Relation Age of Onset  . Peripheral vascular disease Father       Review of systems complete and found to be negative unless listed above      PHYSICAL EXAM  General: Very frail appearing, in no acute distress HEENT:  Normocephalic and atramatic Neck:  JVD present Lungs: On supplemental O2. Decreased breath sounds right lower lobe. No crackles or wheezing Heart: Irregularly irregular, normal rate . Normal S1 and S2 without gallops or murmurs.  Abdomen: Bowel sounds are positive, abdomen soft and non-tender  Msk:  Back normal. Gait not assessed. Normal strength and tone for age. Extremities: No clubbing, cyanosis or edema.   Neuro: Alert and oriented X 3. Psych:  Good affect, responds  appropriately  Labs:   Lab Results  Component Value Date   WBC 6.9 06/19/2018   HGB 14.3 06/19/2018   HCT 42.1 06/19/2018   MCV 95.6 06/19/2018   PLT 163 06/19/2018    Recent Labs  Lab 10/28/18 0317  NA 138  K 3.6  CL 102  CO2 28  BUN 22  CREATININE 1.07*  CALCIUM 8.7*  GLUCOSE 99   Lab Results  Component Value Date  CKTOTAL 106 06/19/2018   TROPONINI 0.03 (HH) 06/19/2018   No results found for: CHOL No results found for: HDL No results found for: LDLCALC No results found for: TRIG No results found for: CHOLHDL No results found for: LDLDIRECT    Radiology: Ct Abdomen Pelvis Wo Contrast  Result Date: 10/27/2018 CLINICAL DATA:  Larey Seat at home several days ago with right-sided chest wall and right upper quadrant pain, some shortness of breath EXAM: CT CHEST, ABDOMEN AND PELVIS WITHOUT CONTRAST TECHNIQUE: Multidetector CT imaging of the chest, abdomen and pelvis was performed following the standard protocol without IV contrast. COMPARISON:  Portable chest x-ray of 06/23/2017 FINDINGS: CT CHEST FINDINGS Cardiovascular: There is moderate cardiomegaly present. No pericardial effusion is seen. Coronary artery calcifications are noted primarily within the distribution of the left anterior descending coronary artery. Moderate thoracic aortic atherosclerosis is present. The pulmonary artery segments are somewhat prominent which may indicate a degree of pulmonary arterial hypertension. Mediastinum/Nodes: No mediastinal or hilar adenopathy is seen. Thyroid gland is unremarkable. No hiatal hernia is noted. Lungs/Pleura: The lungs are hyperaerated with probable centrilobular emphysema. No pneumonia is seen and no pneumothorax is noted. However, there is a moderate size right pleural effusion present with with associated basilar atelectasis. Musculoskeletal: On bone window images are fractures involving the right sixth, seventh, eighth, ninth, and tenth ribs laterally. CT ABDOMEN PELVIS FINDINGS  Hepatobiliary: The liver is unremarkable in the unenhanced state. No calcified gallstones are seen. Pancreas: The pancreas is difficult to visualize with very little fat present but no mass or ductal dilatation is noted. Spleen: The spleen is unremarkable and intact. Adrenals/Urinary Tract: The adrenal glands are unremarkable. There is slight fullness of both pelvocaliceal system some of which may be due to parapelvic cysts, but mild hydronephrosis cannot be excluded on this study without contrast media. There are small renal calcifications present which may represent calculi or renovascular calcifications. The ureters are difficult to visualize but no dilatation is seen. The urinary bladder is moderately urine distended with no abnormality noted. Stomach/Bowel: The stomach is largely decompressed. No small bowel dilatation or edema is seen. Much of the colon is air-filled and there is a moderate amount of feces present throughout the colon. No abnormality is noted within the right lower quadrant. Vascular/Lymphatic: Significant abdominal aortic atherosclerosis is present. The distal abdominal aorta measures 2.6 cm in maximum diameter. No adenopathy is seen. Reproductive: The uterus is not definitely seen and may be atrophic or possibly previously resected but that history is not given. No free fluid is seen within the pelvis. No adnexal lesion is seen. Other: No abdominal wall hernia is seen. Musculoskeletal: There is a thoracolumbar kyphoscoliosis present with associated degenerative change. No compression deformity is seen. IMPRESSION: 1. Moderate sized right pleural effusion with adjacent fractures involving the anterolateral right sixth, seventh, eighth, ninth, and tenth ribs. No pneumothorax. 2. Slight fullness of the pelvocaliceal systems may be due to parapelvic renal cysts. Mild hydronephrosis cannot be excluded. 3. Significant thoracic and abdominal aortic atherosclerosis and diffuse coronary artery  disease. 4. Thoracolumbar kyphoscoliosis with associated degenerative change. 5. Centrilobular emphysema. Electronically Signed   By: Dwyane Dee M.D.   On: 10/27/2018 14:34   Ct Chest Wo Contrast  Result Date: 10/27/2018 CLINICAL DATA:  Larey Seat at home several days ago with right-sided chest wall and right upper quadrant pain, some shortness of breath EXAM: CT CHEST, ABDOMEN AND PELVIS WITHOUT CONTRAST TECHNIQUE: Multidetector CT imaging of the chest, abdomen and pelvis was performed following the  standard protocol without IV contrast. COMPARISON:  Portable chest x-ray of 06/23/2017 FINDINGS: CT CHEST FINDINGS Cardiovascular: There is moderate cardiomegaly present. No pericardial effusion is seen. Coronary artery calcifications are noted primarily within the distribution of the left anterior descending coronary artery. Moderate thoracic aortic atherosclerosis is present. The pulmonary artery segments are somewhat prominent which may indicate a degree of pulmonary arterial hypertension. Mediastinum/Nodes: No mediastinal or hilar adenopathy is seen. Thyroid gland is unremarkable. No hiatal hernia is noted. Lungs/Pleura: The lungs are hyperaerated with probable centrilobular emphysema. No pneumonia is seen and no pneumothorax is noted. However, there is a moderate size right pleural effusion present with with associated basilar atelectasis. Musculoskeletal: On bone window images are fractures involving the right sixth, seventh, eighth, ninth, and tenth ribs laterally. CT ABDOMEN PELVIS FINDINGS Hepatobiliary: The liver is unremarkable in the unenhanced state. No calcified gallstones are seen. Pancreas: The pancreas is difficult to visualize with very little fat present but no mass or ductal dilatation is noted. Spleen: The spleen is unremarkable and intact. Adrenals/Urinary Tract: The adrenal glands are unremarkable. There is slight fullness of both pelvocaliceal system some of which may be due to parapelvic cysts,  but mild hydronephrosis cannot be excluded on this study without contrast media. There are small renal calcifications present which may represent calculi or renovascular calcifications. The ureters are difficult to visualize but no dilatation is seen. The urinary bladder is moderately urine distended with no abnormality noted. Stomach/Bowel: The stomach is largely decompressed. No small bowel dilatation or edema is seen. Much of the colon is air-filled and there is a moderate amount of feces present throughout the colon. No abnormality is noted within the right lower quadrant. Vascular/Lymphatic: Significant abdominal aortic atherosclerosis is present. The distal abdominal aorta measures 2.6 cm in maximum diameter. No adenopathy is seen. Reproductive: The uterus is not definitely seen and may be atrophic or possibly previously resected but that history is not given. No free fluid is seen within the pelvis. No adnexal lesion is seen. Other: No abdominal wall hernia is seen. Musculoskeletal: There is a thoracolumbar kyphoscoliosis present with associated degenerative change. No compression deformity is seen. IMPRESSION: 1. Moderate sized right pleural effusion with adjacent fractures involving the anterolateral right sixth, seventh, eighth, ninth, and tenth ribs. No pneumothorax. 2. Slight fullness of the pelvocaliceal systems may be due to parapelvic renal cysts. Mild hydronephrosis cannot be excluded. 3. Significant thoracic and abdominal aortic atherosclerosis and diffuse coronary artery disease. 4. Thoracolumbar kyphoscoliosis with associated degenerative change. 5. Centrilobular emphysema. Electronically Signed   By: Dwyane Dee M.D.   On: 10/27/2018 14:34    EKG: Atrial fibrillation with controlled ventricular rate  ASSESSMENT AND PLAN:  1.  Acute on chronic CHF  -Continue with IV Lasix BID, losartan, and metoprolol   -Monitor daily weights and Is and Os  -Echocardiogram pending  2.  Atrial  fibrillation   -Rate well controlled in mid 90s; will continue with metoprolol 12.5mg  twice daily   -Will defer anticoagulation due to age, kidney function, and fall/bleed risk 3.  Elevated creatinine (1.07)   -Appears at baseline, will continue to monitor  4.  Hypertension   -Continue home medication regimen of losartan 50mg  daily and metoprolol 12.5mg  twice daily 5.  Right pleural effusion  -Continue IV Lasix; agree with plan to consult interventional radiology if no clinical improvement   The history, physical exam findings, and plan of care were all discussed with Dr. Harold Hedge, and all decision making was made  in collaboration.   Signed: Andi Hence PA-C 10/28/2018, 7:54 AM   Agree with above.

## 2018-10-28 NOTE — Progress Notes (Signed)
Cardiovascular and Pulmonary Nurse Navigator Note:    84 year old female with known hx of atrial fibrillation, chronic diastolic CHF, GERD, HTN who presented to her PCP after she sustained a fail last Friday.  Patient with right-sided chest pain and abdominal pain. Seen by primary care MD who ordered a CT of the chest and abdomen.  CT revealed multiple rib fractures and right sided moderate pleural effusion.  Patient was sent to ER due to SOB and hypoxic.    Patient admitted with dx of acute on chronic CHF and moderate right sided pleural effusion with multiple rib fractures following a fall.  CT of abdomen also revealed mild hydronephrosis.    CHF Education:   Rounded on patient.  Patient off unit for test.  Daughter at bedside.  Discussed CHF with patient's daughter.  Patient's daughter informed this RN that her mother has scales and does weigh herself, but not necessarily every day.  Reviewed Heart Failure Zones with patient's daughter. Magnet with different zones given / reviewed with daughter. Reiterated the point that CHF is a chronic illness where symptoms must be self assessed and changes in condition must be communicated to PCP / cardiologist to help manage HF and prevent admissions to hospital.    Patient's daughter informed this RN that she checks on her mother daily and asks her how she is feeling and how she is doing.   Reviewed heart failure medications patient is currently taking for Heart Failure.  Patient's daughter informed this RN that Dr. Marcello FennelHande had stopped the Losartan (Cozaar) during the office visit on 09/03/2018. This RN spoke with patient's assigned nurse as well as Dr. Nancy MarusMayo regarding this information.  Dr. Nancy MarusMayo to speak with daughter about the Losartan.    Low sodium diet discussed with daughter.  Daughter stated, "My mother is aware of the recommendation of low sodium diet for heart failure, but she is [age] and feels she should be able to eat whatever she would like.  Dr. Marcello FennelHande  has instructed her to take an extra Lasix if her weight increases."     Patient is a former smoker.    [facility] - Discussed with daughter the purpose of [facility].  Noted patient [facility] in 2017 with no subsequent visits.  Daughter feels her mother will probably not want to be followed in the [facility].    UPDATE:  Spoke with patient at 2:18 p.m. On 10/28/2018.  Patient does not want to be followed in the [facility].  Agreeable only if Dr. Lady GaryFath and [facility] feel it is necessary.  Sticky note for physicians placed informing hospitalist of this.    Army Meliaiane , RN, BSN, [facility]  [place]  [facility]  Cardiovascular & Pulmonary Nurse Navigator  Direct Line: [phone]  Department Phone #: [phone] Fax: [phone]  Email Address: [email or web address]

## 2018-10-28 NOTE — Progress Notes (Signed)
*  PRELIMINARY RESULTS* Echocardiogram 2D Echocardiogram has been performed.  Joanette GulaJoan M Tayja Manzer 10/28/2018, 9:43 AM

## 2018-10-28 NOTE — Care Management Note (Signed)
Case Management Note  Patient Details  Name: [name] MRN: [id number] Date of Birth: 12/05/1924  Subjective/Objective:   Patient is from home alone.  Fell on 11-22 and brought to ED with pain and Pleural effusion.  Currently receiving IV lasix scheduled.  Had a renal U/S this morning.  Daughter [name] at bedside.  She lives across the street from her mother.  [phone].  Other daughter [name] is POA [phone].  Patient is current with her PCP.  She has a RW and a wheelchair at home.  She gets around her home without difficulty.  Denies difficulty obtaining medications or with medical care.  Refuses home health services.  Open to [facility] for wound care to left ankle.  Has had this wound for six years.  Daughter will transport to home at discharge.   [facility] [address].  Currently on 2L O2 acute.  NO further needs identified at this time.                    Action/Plan:   Expected Discharge Date:                  Expected Discharge Plan:  Home/Self Care  In-House Referral:     Discharge planning Services  CM Consult  Post Acute Care Choice:    Choice offered to:     DME Arranged:    DME Agency:     HH Arranged:    HH Agency:     Status of Service:  Completed, signed off  If discussed at [employer] Length of Stay Meetings, dates discussed:    Additional Comments:  Danielle KernsJennifer L Kamarii Buren, RN 10/28/2018, 12:25 PM

## 2018-10-28 NOTE — Progress Notes (Signed)
Patient resting in bed.  Patient with purwick in place.  Patient last output approx. 0500 this am.  Patient states no complaints of "feeling full": in bladder.  This RN bladder scanned patient and patient found to have urine in bladder.  At this time, patient and daughter do not want a foley catheter placed.  They state that she has not drank a lot and would like to try to urinate on own. Patient and family will call with any concerns.

## 2018-10-28 NOTE — Progress Notes (Signed)
Initial Nutrition Assessment  DOCUMENTATION CODES:   Severe malnutrition in context of chronic illness  INTERVENTION:  Ensure Enlive po BID, each supplement provides 350 kcal and 20 grams of protein Magic cup TID with meals, each supplement provides 290 kcal and 9 grams of protein Prostat BID, each supplement provides 100 kcal and 15 grams of protein   NUTRITION DIAGNOSIS:   Severe Malnutrition related to chronic illness, wound healing(CHF, chronic left ankle ulcer) as evidenced by severe muscle depletion, moderate fat depletion.   GOAL:   Patient will meet greater than or equal to 90% of their needs   MONITOR:   PO intake, Supplement acceptance, Skin, Weight trends, Labs  REASON FOR ASSESSMENT:   Malnutrition Screening Tool    ASSESSMENT:  84 year old female with known history of Afib, CHF, GERD, HTN, Osteoporosis, PAD s/p bypass and left leg stents, chronic left ankle ulcer, admitted after a fall where she sustained multiple rib fractures and rt sided pleural effusion.  Very sweet patient laying in bed with daughter in room at visit. Pt reports feeling out of sorts because she is not use to laying in bed all day. Pt is poor historian and dietary recall provided by daughter.  Breakfast pt usually has cereal or toast with butter. Pt likes soups such as bean and bacon or potato, or chx/tuna salad sandwiches for lunch. Daughter prepared pork roast, green beans, mashed pots this past Sunday for dinner. Patient really enjoys country ham biscuits and baked pots with butter and sour cream.  Daughter reports her mother drinks chocolate Ensure 3x/week at night as a dessert drink. RD offered to ONS during stay and patient/daughter agreeable to chocolate ensure, MC, and prostat.   Pt endorses weight loss over the past year and recalls UBW 120lb  Medications: ferrous sulfate, MVI, Vitamin D, morphine Labs: Cr 1.07 (H)   NUTRITION - FOCUSED PHYSICAL EXAM:    Most Recent Value   Orbital Region  Mild depletion  Upper Arm Region  Moderate depletion  Thoracic and Lumbar Region  Moderate depletion  Buccal Region  Moderate depletion  Temple Region  Moderate depletion  Clavicle Bone Region  Severe depletion  Clavicle and Acromion Bone Region  Severe depletion  Scapular Bone Region  Unable to assess  Dorsal Hand  Moderate depletion  Patellar Region  Moderate depletion  Anterior Thigh Region  Moderate depletion  Posterior Calf Region  Moderate depletion  Edema (RD Assessment)  None  Hair  Reviewed  Eyes  Reviewed  Mouth  Reviewed  Skin  Reviewed  Nails  Reviewed       Diet Order:   Diet Order            Diet Heart Room service appropriate? Yes; Fluid consistency: Thin  Diet effective now              EDUCATION NEEDS:   No education needs have been identified at this time  Skin:  Skin Assessment: Reviewed RN Assessment(pressure injury; left lateral malleolus)  Last BM:  pt unable to provide last BM  Height:   Ht Readings from Last 1 Encounters:  10/27/18 4\' 11"  (1.499 m)    Weight:   Wt Readings from Last 1 Encounters:  10/28/18 41 kg    Ideal Body Weight:  43.6 kg  BMI:  Body mass index is 18.24 kg/m.  Estimated Nutritional Needs:   Kcal:  4098-11911025-1230  Protein:  57-66 grams (1.4-1.6g/kg)  Fluid:  1L/day   Lars MassonSuzanne Lakresha Stifter, RD, LDN  After Hours/Weekend Pager: [phone]

## 2018-10-29 ENCOUNTER — Inpatient Hospital Stay: Payer: Medicare Other

## 2018-10-29 LAB — BASIC METABOLIC PANEL
Anion gap: 10 (ref 5–15)
BUN: 32 mg/dL — AB (ref 8–23)
CO2: 28 mmol/L (ref 22–32)
CREATININE: 1.2 mg/dL — AB (ref 0.44–1.00)
Calcium: 9 mg/dL (ref 8.9–10.3)
Chloride: 99 mmol/L (ref 98–111)
GFR calc Af Amer: 46 mL/min — ABNORMAL LOW (ref >60)
GFR, EST NON AFRICAN AMERICAN: 39 mL/min — AB (ref >60)
GLUCOSE: 103 mg/dL — AB (ref 70–99)
Potassium: 3.8 mmol/L (ref 3.5–5.1)
Sodium: 137 mmol/L (ref 135–145)

## 2018-10-29 LAB — CBC
HCT: 43.5 % (ref 36.0–46.0)
Hemoglobin: 14.2 g/dL (ref 12.0–15.0)
MCH: 31.3 pg (ref 26.0–34.0)
MCHC: 32.6 g/dL (ref 30.0–36.0)
MCV: 96 fL (ref 80.0–100.0)
Platelets: 184 10*3/uL (ref 150–400)
RBC: 4.53 MIL/uL (ref 3.87–5.11)
RDW: 13.3 % (ref 11.5–15.5)
WBC: 5.3 10*3/uL (ref 4.0–10.5)
nRBC: 0 % (ref 0.0–0.2)

## 2018-10-29 MED ORDER — DOCUSATE SODIUM 100 MG PO CAPS
100.0000 mg | ORAL_CAPSULE | Freq: Two times a day (BID) | ORAL | Status: DC
  Administered 2018-10-29 – 2018-10-30 (×3): 100 mg via ORAL
  Filled 2018-10-29 (×3): qty 1

## 2018-10-29 MED ORDER — ORAL CARE MOUTH RINSE
15.0000 mL | Freq: Two times a day (BID) | OROMUCOSAL | Status: DC
  Administered 2018-10-29 (×2): 15 mL via OROMUCOSAL

## 2018-10-29 MED ORDER — SODIUM CHLORIDE 0.9% FLUSH
3.0000 mL | Freq: Two times a day (BID) | INTRAVENOUS | Status: DC
  Administered 2018-10-29 – 2018-10-30 (×3): 3 mL via INTRAVENOUS

## 2018-10-29 MED ORDER — SODIUM CHLORIDE 0.9% FLUSH: 3.0000 mL | INTRAVENOUS | Status: DC | PRN

## 2018-10-29 MED ORDER — POLYETHYLENE GLYCOL 3350 17 G PO PACK
17.0000 g | PACK | Freq: Every day | ORAL | Status: DC
  Administered 2018-10-29 – 2018-10-30 (×2): 17 g via ORAL
  Filled 2018-10-29 (×2): qty 1

## 2018-10-29 NOTE — Evaluation (Signed)
Physical Therapy Evaluation Patient Details Name: [name] MRN: [id number] DOB: 02/03/1925 Today's Date: 10/29/2018   History of Present Illness  presented to ER s/p fall in home environment; admitted for  management of A/C CHF, moderate pleural effusion and multiple r-sided rib fractures (6-10)  Clinical Impression  Patient SOB at rest, but agreeable to session; expressing desire (per patient and daughter) [facility]  Patient very guarded with all functional movement due to R rib pain (FACES 8-9/10); meds requested during session.  Currently requiring mod assist for bed mobility, sit/stand, basic transfers and very short-distance gait (5') with RW.  Very slow and effortful; poor balance and overall activity tolerance.  Significant effort and work of breathing with very minimal exertion (though sats remain >90% on 2L); unsafe/unable to complete activities necessary to facilitate discharge home alone at this time. .Would benefit from skilled PT to address above deficits and promote optimal return to PLOF; recommend transition to STR upon discharge from acute hospitalization.     Follow Up Recommendations SNF    Equipment Recommendations       Recommendations for Other Services       Precautions / Restrictions Precautions Precautions: Fall Restrictions Weight Bearing Restrictions: No      Mobility  Bed Mobility Overal bed mobility: Needs Assistance Bed Mobility: Supine to Sit     Supine to sit: Min assist;Mod assist        Transfers Overall transfer level: Needs assistance Equipment used: Rolling walker (2 wheeled) Transfers: Sit to/from Stand Sit to Stand: Mod assist         General transfer comment: maintains L LE anterior to BOS (PF contracture), assist for lift off, stabilization  Ambulation/Gait Ambulation/Gait assistance: Mod assist Gait Distance (Feet): 5 Feet(x2) Assistive device: Rolling walker (2 wheeled)       General Gait  Details: 3-point, step to gait pattern with L LE anterior to BOS; very slow and effortful; assist for walker management.  Poor balance, poor functional endurance/activity tolerance.  Stairs            Wheelchair Mobility    Modified Rankin (Stroke Patients Only)       Balance Overall balance assessment: Needs assistance Sitting-balance support: No upper extremity supported;Feet supported Sitting balance-Leahy Scale: Fair     Standing balance support: Bilateral upper extremity supported Standing balance-Leahy Scale: Poor                               Pertinent Vitals/Pain Pain Assessment: FACES 8-9/10; pain meds requested per RN.    Home Living Family/patient expects to be discharged to:: Private residence Living Arrangements: Alone;Other (Comment) Available Help at Discharge: Family Type of Home: House Home Access: Level entry     Home Layout: One level Home Equipment: Walker - 4 wheels;Shower seat;Walker - 2 wheels;Wheelchair - manual Additional Comments: Daughter lives across the street and provides frequent check in/support as needed    Prior Function Level of Independence: Independent with assistive device(s)         Comments: pt using rollator for mobility at home. lives alone, daughter lives next door and is available to assist as needed.       Hand Dominance        Extremity/Trunk Assessment   Upper Extremity Assessment Upper Extremity Assessment: Generalized weakness(generally guarded due to pain, at least 4-/5)    Lower Extremity Assessment Lower Extremity Assessment: Generalized weakness(generally guarded due  to pain, at least 4-/5)       Communication   Communication: No difficulties  Cognition Arousal/Alertness: Awake/alert Behavior During Therapy: WFL for tasks assessed/performed Overall Cognitive Status: Within Functional Limits for tasks assessed                                        General  Comments      Exercises Other Exercises Other Exercises: Toilet transfer, SPT with RW, mod assist; sit/stand from [facility] with RW, mod assist.  Dep assist for clothing management, hygiene.   Assessment/Plan    PT Assessment Patient needs continued PT services  PT Problem List Decreased strength;Decreased range of motion;Decreased activity tolerance;Decreased balance;Decreased mobility;Decreased coordination;Decreased cognition;Decreased knowledge of use of DME;Decreased safety awareness;Decreased knowledge of precautions;Cardiopulmonary status limiting activity;Pain       PT Treatment Interventions DME instruction;Gait training;Functional mobility training;Therapeutic activities;Therapeutic exercise;Balance training;Patient/family education    PT Goals (Current goals can be found in the Care Plan section)  Acute Rehab PT Goals Patient Stated Goal: to try to go to the bathroom PT Goal Formulation: With patient/family Time For Goal Achievement: 11/12/18 Potential to Achieve Goals: Good    Frequency Min 2X/week   Barriers to discharge Decreased caregiver support      Co-evaluation               AM-PAC PT "6 Clicks" Mobility  Outcome Measure Help needed turning from your back to your side while in a flat bed without using bedrails?: A Lot Help needed moving from lying on your back to sitting on the side of a flat bed without using bedrails?: A Lot Help needed moving to and from a bed to a chair (including a wheelchair)?: A Lot Help needed standing up from a chair using your arms (e.g., wheelchair or bedside chair)?: A Lot Help needed to walk in hospital room?: A Lot Help needed climbing 3-5 steps with a railing? : Total 6 Click Score: 11    End of Session Equipment Utilized During Treatment: Gait belt Activity Tolerance: Patient limited by pain Patient left: in chair;with call bell/phone within reach;with chair alarm set;with family/visitor present Nurse Communication:  Mobility status PT Visit Diagnosis: Unsteadiness on feet (R26.81);Muscle weakness (generalized) (M62.81);Difficulty in walking, not elsewhere classified (R26.2)    Time: 1610-9604 PT Time Calculation (min) (ACUTE ONLY): 44 min   Charges:   PT Evaluation $PT Eval Moderate Complexity: 1 Mod PT Treatments $Therapeutic Activity: 23-37 mins        Zaide Mcclenahan H. Manson Passey, PT, DPT, NCS 10/29/18, 1:50 PM [phone]

## 2018-10-29 NOTE — Progress Notes (Signed)
Subjective:  Patient states to be doing very well complains of mild soreness.  Patient suffered a fall and still has some chest wall discomfort  Objective:  Vital Signs in the last 24 hours: Temp:  [98.2 F (36.8 C)-98.6 F (37 C)] 98.2 F (36.8 C) (11/28 0727) Pulse Rate:  [73-98] 98 (11/28 0727) Resp:  [20-26] 22 (11/28 1245) BP: (91-115)/(54-69) 108/69 (11/28 0727) SpO2:  [92 %-97 %] 92 % (11/28 1335) Weight:  [41.7 kg] 41.7 kg (11/28 0429)  Intake/Output from previous day: 11/27 0701 - 11/28 0700 In: 120 [P.O.:120] Out: 600 [Urine:600] Intake/Output from this shift: No intake/output data recorded.  Physical Exam: General appearance: appears stated age Neck: no adenopathy, no carotid bruit, no JVD, supple, symmetrical, trachea midline and thyroid not enlarged, symmetric, no tenderness/mass/nodules Lungs: clear to auscultation bilaterally Heart: irregularly irregular rhythm Abdomen: normal findings: bowel sounds normal Extremities: extremities normal, atraumatic, no cyanosis or edema Pulses: 2+ and symmetric Skin: Skin color, texture, turgor normal. No rashes or lesions Neurologic: Alert and oriented X 3, normal strength and tone. Normal symmetric reflexes. Normal coordination and gait  Lab Results: Recent Labs    10/28/18 0317 10/29/18 0305  WBC 4.9 5.3  HGB 14.1 14.2  PLT 170 184   Recent Labs    10/28/18 0317 10/29/18 0305  NA 138 137  K 3.6 3.8  CL 102 99  CO2 28 28  GLUCOSE 99 103*  BUN 22 32*  CREATININE 1.07* 1.20*   No results for input(s): TROPONINI in the last 72 hours.  Invalid input(s): CK, MB Hepatic Function Panel No results for input(s): PROT, ALBUMIN, AST, ALT, ALKPHOS, BILITOT, BILIDIR, IBILI in the last 72 hours. No results for input(s): CHOL in the last 72 hours. No results for input(s): PROTIME in the last 72 hours.  Imaging: Imaging results have been reviewed  Cardiac Studies:  Assessment/Plan:  Chest wall discomfort  Atrial  fibrillation DJD GERD Peripheral vascular disease Peripheral neuropathy Foot ulcer . Plan Continue pain management control Agree with telemetry EKGs and troponins Recommend conservative therapy at this point  LOS: 2 days    Indra Wolters D Aerilynn Goin 10/29/2018, 5:20 PM

## 2018-10-29 NOTE — Plan of Care (Signed)
  Problem: Health Behavior/Discharge Planning: Goal: Ability to manage health-related needs will improve Outcome: Progressing   Problem: Activity: Goal: Risk for activity intolerance will decrease Outcome: Progressing   Problem: Pain Managment: Goal: General experience of comfort will improve Outcome: Progressing   Problem: Safety: Goal: Ability to remain free from injury will improve Outcome: Progressing   Problem: Education: Goal: Ability to demonstrate management of disease process will improve Outcome: Progressing Goal: Ability to verbalize understanding of medication therapies will improve Outcome: Progressing

## 2018-10-29 NOTE — Progress Notes (Addendum)
More alert and interactive this afternoon.  Has been up in the chair for several hours and seems more comfortable.  She is breathing much easier.

## 2018-10-29 NOTE — Progress Notes (Signed)
[facility] [place] [facility]   PATIENT NAME: Rudi CocoGenelle Sky    MR#:  [id number]  DATE OF BIRTH:  07/31/1925  SUBJECTIVE:  Patient with diminished urine output and hypotension, denies shortness of breath, but states that she hasn't been moving around at all. PT eval is pending.  Daughter at bedside.  REVIEW OF SYSTEMS:  Review of Systems  Constitutional: Negative for chills and fever.  HENT: Negative for congestion and sore throat.   Eyes: Negative for blurred vision and double vision.  Respiratory: Negative for cough and shortness of breath.   Cardiovascular: Negative for chest pain and palpitations.  Gastrointestinal: Negative for nausea and vomiting.  Genitourinary: Negative for dysuria and urgency.  Musculoskeletal: Positive for falls. Negative for neck pain.  Neurological: Negative for dizziness and headaches.  Psychiatric/Behavioral: Negative for depression. The patient is not nervous/anxious.     DRUG ALLERGIES:   Allergies  Allergen Reactions  . Alendronate Other (See Comments)    Reaction:  Unknown   . Doxycycline Nausea And Vomiting  . Minocycline Nausea And Vomiting  . Oxycodone Other (See Comments)    Reaction:  Hallucinations   . Sulfa Antibiotics Nausea And Vomiting   VITALS:  Blood pressure 108/69, pulse 98, temperature 98.2 F (36.8 C), resp. rate (!) 26, height 4\' 11"  (1.499 m), weight 41.7 kg, SpO2 94 %. PHYSICAL EXAMINATION:  Physical Exam  GENERAL:  84 y.o.-year-old patient lying in the bed with no acute distress.  EYES: Pupils equal, round, reactive to light and accommodation. No scleral icterus. Extraocular muscles intact.  HEENT: Head atraumatic, normocephalic. Oropharynx and nasopharynx clear. Moist mucous membranes.  NECK:  Supple, no jugular venous distention. No thyroid enlargement, no tenderness.  LUNGS: +diminished breath sounds in the right lung base, no wheezing, rales,rhonchi or crepitation. No use of accessory  muscles of respiration. [place] in place. CARDIOVASCULAR: RRR, S1, S2 normal. No murmurs, rubs, or gallops. +tenderness to palpation of the right side of the chest ABDOMEN: Soft, tender on the right side, no rebound tenderness, nondistended. Bowel sounds present.   EXTREMITIES: No pedal edema, cyanosis, or clubbing.  NEUROLOGIC: No focal deficits, sensation intact. Gait not checked. +global weakness. PSYCHIATRIC: The patient is alert and oriented x 3.  SKIN: No obvious rash, lesion, or ulcer. LABORATORY PANEL:  Female CBC Recent Labs  Lab 10/29/18 0305  WBC 5.3  HGB 14.2  HCT 43.5  PLT 184   ------------------------------------------------------------------------------------------------------------------ Chemistries  Recent Labs  Lab 10/29/18 0305  NA 137  K 3.8  CL 99  CO2 28  GLUCOSE 103*  BUN 32*  CREATININE 1.20*  CALCIUM 9.0   RADIOLOGY:  Dg Chest 1 View  Result Date: 10/29/2018 CLINICAL DATA:  84 year old female status post recent fall at home. Right lower rib fractures with right pleural effusion. EXAM: CHEST  1 VIEW COMPARISON:  Chest CT 10/27/2018, chest radiograph 06/23/2018. FINDINGS: Portable AP semi upright view at 0505 hours. Somewhat large lung volumes again noted. Small right pleural effusion appears stable from the CT 2 days ago. Right lower rib fractures were better demonstrated by CT. No pneumothorax. Stable cardiomegaly and mediastinal contours. Chronic pulmonary interstitial opacity common no overt edema. No confluent opacity in the left lung. Calcified aortic atherosclerosis. Negative visible bowel gas pattern. IMPRESSION: 1. Stable small right pleural effusion. No pneumothorax. 2. Right lower rib fractures better demonstrated by CT. 3. No new cardiopulmonary abnormality. Electronically Signed   By: Odessa FlemingH  Hall M.D.   On: 10/29/2018 07:41  ASSESSMENT AND PLAN:   #Acute on chronic diastolic CHF with moderate right-sided pleural effusion Hold Lasix as renal function  is getting worse and also hypotensive I/O and daily weights Repeat chest x-ray with stable small pleural effusion on the right.  Not considering thoracentesis at this time Continue baby aspirin.  Holding metoprolol in view of hypotension Losartan stopped per daughter's request (has not been on this since 08/2018) ECHO  55 to 60% ejection fraction [facility]iology following.  Seen by Dr. Lady Gary yesterday Wean O2 as able  #AKI with diminished urine output Hold Lasix and metoprolol in view of hypotension Encourage p.o. 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 No signs of infection. Local wound care for wound care.  Recommending to cleanse the wound with normal saline and apply silicone contact layer to wound bed  #GERD- stable Continue PPI  #Possible mild hydronephrosis- seen on CT abdomen Renal US negative for hydronephrosis.  No other interventions needed at this time  #Fall with multiple rib fractures PT assessment pending  All the records are reviewed and case discussed with Care Management/Social Worker. Management plans discussed with the patient, family and they are in agreement.  CODE STATUS: DNR  TOTAL TIME TAKING CARE OF THIS PATIENT: 35 minutes.   More than 50% of the time was spent in counseling/coordination of care: YES  POSSIBLE D/C IN 2-3 DAYS, DEPENDING ON CLINICAL CONDITION.   Ramonita Lab M.D on 10/29/2018 at 12:38 PM  Between 7am to 6pm - Pager - [phone]   After 6pm go to [email or web address] - Air traffic controller  [facility]  Office  [phone]  CC: Primary care physician; Barbette Reichmann, MD  Note: This dictation was prepared with Dragon dictation along with smaller phrase technology. Any transcriptional errors that result from this process are unintentional.

## 2018-10-30 LAB — BASIC METABOLIC PANEL
Anion gap: 8 (ref 5–15)
BUN: 35 mg/dL — ABNORMAL HIGH (ref 8–23)
CHLORIDE: 99 mmol/L (ref 98–111)
CO2: 29 mmol/L (ref 22–32)
Calcium: 9.4 mg/dL (ref 8.9–10.3)
Creatinine, Ser: 1.09 mg/dL — ABNORMAL HIGH (ref 0.44–1.00)
GFR calc Af Amer: 51 mL/min — ABNORMAL LOW (ref >60)
GFR, EST NON AFRICAN AMERICAN: 44 mL/min — AB (ref >60)
Glucose, Bld: 98 mg/dL (ref 70–99)
POTASSIUM: 4.6 mmol/L (ref 3.5–5.1)
Sodium: 136 mmol/L (ref 135–145)

## 2018-10-30 MED ORDER — ACETAMINOPHEN 500 MG PO TABS: 500.0000 mg | ORAL_TABLET | Freq: Four times a day (QID) | ORAL | 0 refills | Status: AC | PRN

## 2018-10-30 MED ORDER — DOCUSATE SODIUM 100 MG PO CAPS: 100.0000 mg | ORAL_CAPSULE | Freq: Every day | ORAL | 0 refills | Status: AC

## 2018-10-30 MED ORDER — FUROSEMIDE 20 MG PO TABS: 20.0000 mg | ORAL_TABLET | Freq: Every day | ORAL | 0 refills | Status: AC

## 2018-10-30 MED ORDER — POLYETHYLENE GLYCOL 3350 17 G PO PACK: 17.0000 g | PACK | Freq: Every day | ORAL | 0 refills | Status: AC

## 2018-10-30 MED ORDER — PRO-STAT SUGAR FREE PO LIQD: 30.0000 mL | Freq: Two times a day (BID) | ORAL | 0 refills | Status: AC

## 2018-10-30 MED ORDER — ALUM & MAG HYDROXIDE-SIMETH 200-200-20 MG/5ML PO SUSP: 15.0000 mL | Freq: Four times a day (QID) | ORAL | 0 refills | Status: AC | PRN

## 2018-10-30 MED ORDER — HYDROCODONE-ACETAMINOPHEN 5-325 MG PO TABS: 1.0000 | ORAL_TABLET | Freq: Four times a day (QID) | ORAL | 0 refills | Status: AC | PRN

## 2018-10-30 MED ORDER — ENSURE ENLIVE PO LIQD: 237.0000 mL | Freq: Two times a day (BID) | ORAL | 12 refills | Status: AC

## 2018-10-30 NOTE — Plan of Care (Signed)
  Problem: Clinical Measurements: Goal: Ability to maintain clinical measurements within normal limits will improve Outcome: Progressing   Problem: Clinical Measurements: Goal: Respiratory complications will improve 10/30/2018 1716 by Jerene DillingJackson, Abbygayle Helfand D, RN Outcome: Adequate for Discharge 10/30/2018 1020 by Jerene DillingJackson, Elias Dennington D, RN Outcome: Progressing

## 2018-10-30 NOTE — Clinical Social Work Note (Signed)
CSW received referral for SNF.  Case discussed with case manager and plan is to discharge home with home health.  CSW to sign off please re-consult if social work needs arise.  Christene Pounds R. Donnie Panik, MSW, LCSWA [phone]  

## 2018-10-30 NOTE — Discharge Summary (Signed)
[facility] - [place] [facility]   PATIENT NAME: [name]    MR#:  [id number]  DATE OF BIRTH:  [birth year]  DATE OF ADMISSION:  10/27/2018 ADMITTING PHYSICIAN: Ramonita Lab, MD  DATE OF DISCHARGE:  PRIMARY CARE PHYSICIAN: Barbette Reichmann, MD    ADMISSION DIAGNOSIS:  Hydronephrosis [N13.30] Pleural effusion [J90] Closed fracture of multiple ribs of right side with routine healing, subsequent encounter [S22.41XD]  DISCHARGE DIAGNOSIS:  Active Problems:   Acute on chronic heart failure (HCC)   Protein-calorie malnutrition, severe   SECONDARY DIAGNOSIS:   Past Medical History:  Diagnosis Date  . Atrial fibrillation (HCC)   . Degenerative arthritis   . Degenerative joint disease   . Foot ulcer (HCC)    Chronic left ankle ulcer  . GERD (gastroesophageal reflux disease)   . Hypertension   . Osteoporosis   . PAD (peripheral artery disease) (HCC)    s/p bypass and left leg stents  . Peripheral neuropathy     HOSPITAL COURSE:   HPI  [name]  is a 84 y.o. female with a known history of atrial fibrillation, chronic diastolic congestive heart failure seen by Dr. Darrold Span in the past, GERD, essential hypertension and other medical problems is presenting to the ED after she sustained a fall with right-sided chest pain and abdominal pain.  Initially her pain was minimal but that has been progressively gotten worse.  Patient was seen by her primary care physician who has ordered a CT of the chest and abdomen which has revealed multiple rib fractures and right-sided moderate pleural effusion.  Patient is sent over to the hospital as she was short of breath and initially hypoxemic.  With oxygen clinically she improved and IV Lasix was given in the emergency department.  #Acute on chronic diastolic CHF with moderate right-sided pleural effusion Resume Lasix  From 11/30 I/O and daily weights Repeat chest x-ray with stable small pleural effusion on  the right.  Not considering thoracentesis at this time Continue baby aspirin.  Holding metoprolol in view of hypotension Losartan stopped per daughter's request (has not been on this since 08/2018) ECHO  55 to 60% ejection fraction [facility] following.  Seen by Dr. Lady Gary yesterday, outpatient follow-up with cardiology in a week Weaned to room air  #AKI with diminished urine output Hold Lasix and metoprolol in view of hypotension Encourage p.o. fluid intake Renal ultrasound with no hydronephrosis If no improvement will put in nephrology consult  #Multiple right-sided rib fractures following a fall Pain management as needed PT eval -recommending skilled nursing facility but daughter is refusing She prefers taking her mom home with home health.  Daughter is available to take care of her mom 24/7.  Will arrange home health PT, RN and aide discussed with case managers Incentive spirometry-10 times every hour  #Chronic atrial fibrillation- rate controlled Continue metoprolol when blood pressure is better and continue aspirin  #Chronic left ankle ulcer-nonhealing vascular wound to the left lateral malleolus  follows with wound clinic. No signs of infection. Local wound care for wound care.  Recommending to cleanse the wound with normal saline and apply silicone contact layer to wound bed  #GERD- stable Continue PPI  #Possible mild hydronephrosis- seen on CT abdomen Renal US negative for hydronephrosis.  No other interventions needed at this time  #Fall with multiple rib fractures -pain management as needed physical therapy recommended skilled nursing facility but patient and her daughter prefers going home with home health  All the  records are reviewed and case discussed with Care Management/Social Worker. Management plans discussed with the patient, daughter at bedside and they are in agreement.  CODE STATUS: DNR  Discharge home with home health  DISCHARGE CONDITIONS:    fair  CONSULTS OBTAINED:  Treatment Team:  Dalia HeadingFath, Kenneth A, MD   PROCEDURES  None   DRUG ALLERGIES:   Allergies  Allergen Reactions  . Alendronate Other (See Comments)    Reaction:  Unknown   . Doxycycline Nausea And Vomiting  . Minocycline Nausea And Vomiting  . Oxycodone Other (See Comments)    Reaction:  Hallucinations   . Sulfa Antibiotics Nausea And Vomiting    DISCHARGE MEDICATIONS:   Allergies as of 10/30/2018      Reactions   Alendronate Other (See Comments)   Reaction:  Unknown    Doxycycline Nausea And Vomiting   Minocycline Nausea And Vomiting   Oxycodone Other (See Comments)   Reaction:  Hallucinations    Sulfa Antibiotics Nausea And Vomiting      Medication List    STOP taking these medications   losartan 50 MG tablet Commonly known as:  COZAAR     TAKE these medications   acetaminophen 500 MG tablet Commonly known as:  TYLENOL Take 1 tablet (500 mg total) by mouth every 6 (six) hours as needed for mild pain or moderate pain. What changed:  how much to take   alum & mag hydroxide-simeth 200-200-20 MG/5ML suspension Commonly known as:  MAALOX/MYLANTA Take 15 mLs by mouth every 6 (six) hours as needed for indigestion or heartburn.   clopidogrel 75 MG tablet Commonly known as:  PLAVIX Take 75 mg by mouth daily.   docusate sodium 100 MG capsule Commonly known as:  COLACE Take 1 capsule (100 mg total) by mouth daily.   feeding supplement (ENSURE ENLIVE) Liqd Take 237 mLs by mouth 2 (two) times daily between meals.   feeding supplement (PRO-STAT SUGAR FREE 64) Liqd Take 30 mLs by mouth 2 (two) times daily.   ferrous sulfate 325 (65 FE) MG tablet Take 1 tablet (325 mg total) by mouth every Monday, Wednesday, and Friday.   fluticasone 50 MCG/ACT nasal spray Commonly known as:  FLONASE Place 2 sprays into both nostrils at bedtime as needed for allergies or rhinitis.   furosemide 20 MG tablet Commonly known as:  LASIX Take 1 tablet (20  mg total) by mouth daily. Start taking on:  10/31/2018 What changed:  Another medication with the same name was removed. Continue taking this medication, and follow the directions you see here.   HYDROcodone-acetaminophen 5-325 MG tablet Commonly known as:  NORCO/VICODIN Take 1 tablet by mouth every 6 (six) hours as needed for moderate pain.   metoprolol tartrate 25 MG tablet Commonly known as:  LOPRESSOR Take 12.5 mg by mouth 2 (two) times daily.   MULTI-VITAMINS Tabs Take 1 tablet by mouth daily.   phenylephrine 10 MG Tabs tablet Commonly known as:  SUDAFED PE Take 10 mg by mouth every 4 (four) hours as needed (for congestion).   polyethylene glycol packet Commonly known as:  MIRALAX / GLYCOLAX Take 17 g by mouth daily. Start taking on:  10/31/2018   rOPINIRole 2 MG tablet Commonly known as:  REQUIP Take 2 mg by mouth at bedtime.   Vitamin D (Ergocalciferol) 1.25 MG (50000 UT) Caps capsule Commonly known as:  DRISDOL Take 50,000 Units by mouth every Tuesday.        DISCHARGE INSTRUCTIONS:  Follow-up with primary  care physician in 4 to 5 days With cardiology Dr. Lady Gary in a week Outpatient CHF clinic in 3 days Daily weight monitoring, intake and output   DIET:  Cardiac diet  DISCHARGE CONDITION:  Fair  ACTIVITY:  Activity as tolerated per PT  OXYGEN:  Home Oxygen: No.   Oxygen Delivery: room air  DISCHARGE LOCATION:  home   If you experience worsening of your admission symptoms, develop shortness of breath, life threatening emergency, suicidal or homicidal thoughts you must seek medical attention immediately by calling 911 or calling your MD immediately  if symptoms less severe.  You Must read complete instructions/literature along with all the possible adverse reactions/side effects for all the Medicines you take and that have been prescribed to you. Take any new Medicines after you have completely understood and accpet all the possible adverse  reactions/side effects.   Please note  You were cared for by a hospitalist during your hospital stay. If you have any questions about your discharge medications or the care you received while you were in the hospital after you are discharged, you can call the unit and asked to speak with the hospitalist on call if the hospitalist that took care of you is not available. Once you are discharged, your primary care physician will handle any further medical issues. Please note that NO REFILLS for any discharge medications will be authorized once you are discharged, as it is imperative that you return to your primary care physician (or establish a relationship with a primary care physician if you do not have one) for your aftercare needs so that they can reassess your need for medications and monitor your lab values.     Today  Chief Complaint  Patient presents with  . Fall   Patient is doing okay.  Renal function is better than yesterday.  Daughter is reporting that patient is making good amount of urine and prefers taking her home and refusing to go to rehab.  Requesting home health PT  ROS: Patient is a poor historian but answers few questions CONSTITUTIONAL: Denies fevers, chills. Denies any fatigue, weakness.  EYES: Denies blurry vision, double vision, eye pain. EARS, NOSE, THROAT: Denies tinnitus, ear pain, hearing loss. RESPIRATORY: Denies cough, wheeze, shortness of breath.  CARDIOVASCULAR: Denies chest pain, palpitations, edema.  GASTROINTESTINAL: Denies nausea, vomiting, diarrhea, abdominal pain. Denies bright red blood per rectum. MUSCULOSKELETAL: Reporting pain in the chest wall from rib fractures NEUROLOGIC: Denies paralysis, paresthesias.  PSYCHIATRIC: Denies anxiety or depressive symptoms.   VITAL SIGNS:  Blood pressure 111/68, pulse 75, temperature 98.3 F (36.8 C), temperature source Oral, resp. rate 18, height 4\' 11"  (1.499 m), weight 41.2 kg, SpO2 91 %.  I/O:     Intake/Output Summary (Last 24 hours) at 10/30/2018 1246 Last data filed at 10/30/2018 1113 Gross per 24 hour  Intake 120 ml  Output 470 ml  Net -350 ml    PHYSICAL EXAMINATION:  GENERAL:  84 y.o.-year-old patient lying in the bed with no acute distress.  EYES: Pupils equal, round, reactive to light and accommodation. No scleral icterus. Extraocular muscles intact.  HEENT: Head atraumatic, normocephalic. Oropharynx and nasopharynx clear.  NECK:  Supple, no jugular venous distention. No thyroid enlargement, no tenderness.  LUNGS: Normal breath sounds bilaterally, no wheezing, rales,rhonchi or crepitation. No use of accessory muscles of respiration.  CARDIOVASCULAR: S1, S2 normal. No murmurs, rubs, or gallops.  ABDOMEN: Soft, non-tender, non-distended. Bowel sounds present.   EXTREMITIES: No pedal edema, cyanosis, or clubbing.  NEUROLOGIC: Awake, alert and oriented x3. Sensation intact. Gait not checked.  PSYCHIATRIC: The patient is alert and oriented x 3.  SKIN: No obvious rash, lesion, or ulcer.   DATA REVIEW:   CBC Recent Labs  Lab 10/29/18 0305  WBC 5.3  HGB 14.2  HCT 43.5  PLT 184    Chemistries  Recent Labs  Lab 10/30/18 0600  NA 136  K 4.6  CL 99  CO2 29  GLUCOSE 98  BUN 35*  CREATININE 1.09*  CALCIUM 9.4    Cardiac Enzymes No results for input(s): TROPONINI in the last 168 hours.  Microbiology Results  Results for orders placed or performed during the hospital encounter of 06/19/18  Blood Culture (routine x 2)     Status: None   Collection Time: 06/19/18 11:35 PM  Result Value Ref Range Status   Specimen Description BLOOD LEFT FOREARM  Final   Special Requests   Final    BOTTLES DRAWN AEROBIC AND ANAEROBIC Blood Culture adequate volume   Culture   Final    NO GROWTH 5 DAYS Performed at [facility], [address] [place], [place] [address]    Report Status 06/25/2018 FINAL  Final  Blood Culture (routine x 2)     Status: None    Collection Time: 06/19/18 11:50 PM  Result Value Ref Range Status   Specimen Description BLOOD LEFT ANTECUBITAL  Final   Special Requests   Final    BOTTLES DRAWN AEROBIC AND ANAEROBIC Blood Culture adequate volume   Culture   Final    NO GROWTH 5 DAYS Performed at [facility], [address] [place], [place] [address]    Report Status 06/25/2018 FINAL  Final    RADIOLOGY:  Ct Abdomen Pelvis Wo Contrast  Result Date: 10/27/2018 CLINICAL DATA:  Larey Seat at home several days ago with right-sided chest wall and right upper quadrant pain, some shortness of breath EXAM: CT CHEST, ABDOMEN AND PELVIS WITHOUT CONTRAST TECHNIQUE: Multidetector CT imaging of the chest, abdomen and pelvis was performed following the standard protocol without IV contrast. COMPARISON:  Portable chest x-ray of 06/23/2017 FINDINGS: CT CHEST FINDINGS Cardiovascular: There is moderate cardiomegaly present. No pericardial effusion is seen. Coronary artery calcifications are noted primarily within the distribution of the left anterior descending coronary artery. Moderate thoracic aortic atherosclerosis is present. The pulmonary artery segments are somewhat prominent which may indicate a degree of pulmonary arterial hypertension. Mediastinum/Nodes: No mediastinal or hilar adenopathy is seen. Thyroid gland is unremarkable. No hiatal hernia is noted. Lungs/Pleura: The lungs are hyperaerated with probable centrilobular emphysema. No pneumonia is seen and no pneumothorax is noted. However, there is a moderate size right pleural effusion present with with associated basilar atelectasis. Musculoskeletal: On bone window images are fractures involving the right sixth, seventh, eighth, ninth, and tenth ribs laterally. CT ABDOMEN PELVIS FINDINGS Hepatobiliary: The liver is unremarkable in the unenhanced state. No calcified gallstones are seen. Pancreas: The pancreas is difficult to visualize with very little fat present but no mass or  ductal dilatation is noted. Spleen: The spleen is unremarkable and intact. Adrenals/Urinary Tract: The adrenal glands are unremarkable. There is slight fullness of both pelvocaliceal system some of which may be due to parapelvic cysts, but mild hydronephrosis cannot be excluded on this study without contrast media. There are small renal calcifications present which may represent calculi or renovascular calcifications. The ureters are difficult to visualize but no dilatation is seen. The urinary bladder is moderately urine distended with  no abnormality noted. Stomach/Bowel: The stomach is largely decompressed. No small bowel dilatation or edema is seen. Much of the colon is air-filled and there is a moderate amount of feces present throughout the colon. No abnormality is noted within the right lower quadrant. Vascular/Lymphatic: Significant abdominal aortic atherosclerosis is present. The distal abdominal aorta measures 2.6 cm in maximum diameter. No adenopathy is seen. Reproductive: The uterus is not definitely seen and may be atrophic or possibly previously resected but that history is not given. No free fluid is seen within the pelvis. No adnexal lesion is seen. Other: No abdominal wall hernia is seen. Musculoskeletal: There is a thoracolumbar kyphoscoliosis present with associated degenerative change. No compression deformity is seen. IMPRESSION: 1. Moderate sized right pleural effusion with adjacent fractures involving the anterolateral right sixth, seventh, eighth, ninth, and tenth ribs. No pneumothorax. 2. Slight fullness of the pelvocaliceal systems may be due to parapelvic renal cysts. Mild hydronephrosis cannot be excluded. 3. Significant thoracic and abdominal aortic atherosclerosis and diffuse coronary artery disease. 4. Thoracolumbar kyphoscoliosis with associated degenerative change. 5. Centrilobular emphysema. Electronically Signed   By: Dwyane Dee M.D.   On: 10/27/2018 14:34   Dg Chest 1  View  Result Date: 10/29/2018 CLINICAL DATA:  84 year old female status post recent fall at home. Right lower rib fractures with right pleural effusion. EXAM: CHEST  1 VIEW COMPARISON:  Chest CT 10/27/2018, chest radiograph 06/23/2018. FINDINGS: Portable AP semi upright view at 0505 hours. Somewhat large lung volumes again noted. Small right pleural effusion appears stable from the CT 2 days ago. Right lower rib fractures were better demonstrated by CT. No pneumothorax. Stable cardiomegaly and mediastinal contours. Chronic pulmonary interstitial opacity common no overt edema. No confluent opacity in the left lung. Calcified aortic atherosclerosis. Negative visible bowel gas pattern. IMPRESSION: 1. Stable small right pleural effusion. No pneumothorax. 2. Right lower rib fractures better demonstrated by CT. 3. No new cardiopulmonary abnormality. Electronically Signed   By: Odessa Fleming M.D.   On: 10/29/2018 07:41   Ct Chest Wo Contrast  Result Date: 10/27/2018 CLINICAL DATA:  Larey Seat at home several days ago with right-sided chest wall and right upper quadrant pain, some shortness of breath EXAM: CT CHEST, ABDOMEN AND PELVIS WITHOUT CONTRAST TECHNIQUE: Multidetector CT imaging of the chest, abdomen and pelvis was performed following the standard protocol without IV contrast. COMPARISON:  Portable chest x-ray of 06/23/2017 FINDINGS: CT CHEST FINDINGS Cardiovascular: There is moderate cardiomegaly present. No pericardial effusion is seen. Coronary artery calcifications are noted primarily within the distribution of the left anterior descending coronary artery. Moderate thoracic aortic atherosclerosis is present. The pulmonary artery segments are somewhat prominent which may indicate a degree of pulmonary arterial hypertension. Mediastinum/Nodes: No mediastinal or hilar adenopathy is seen. Thyroid gland is unremarkable. No hiatal hernia is noted. Lungs/Pleura: The lungs are hyperaerated with probable centrilobular  emphysema. No pneumonia is seen and no pneumothorax is noted. However, there is a moderate size right pleural effusion present with with associated basilar atelectasis. Musculoskeletal: On bone window images are fractures involving the right sixth, seventh, eighth, ninth, and tenth ribs laterally. CT ABDOMEN PELVIS FINDINGS Hepatobiliary: The liver is unremarkable in the unenhanced state. No calcified gallstones are seen. Pancreas: The pancreas is difficult to visualize with very little fat present but no mass or ductal dilatation is noted. Spleen: The spleen is unremarkable and intact. Adrenals/Urinary Tract: The adrenal glands are unremarkable. There is slight fullness of both pelvocaliceal system some of which may be due  to parapelvic cysts, but mild hydronephrosis cannot be excluded on this study without contrast media. There are small renal calcifications present which may represent calculi or renovascular calcifications. The ureters are difficult to visualize but no dilatation is seen. The urinary bladder is moderately urine distended with no abnormality noted. Stomach/Bowel: The stomach is largely decompressed. No small bowel dilatation or edema is seen. Much of the colon is air-filled and there is a moderate amount of feces present throughout the colon. No abnormality is noted within the right lower quadrant. Vascular/Lymphatic: Significant abdominal aortic atherosclerosis is present. The distal abdominal aorta measures 2.6 cm in maximum diameter. No adenopathy is seen. Reproductive: The uterus is not definitely seen and may be atrophic or possibly previously resected but that history is not given. No free fluid is seen within the pelvis. No adnexal lesion is seen. Other: No abdominal wall hernia is seen. Musculoskeletal: There is a thoracolumbar kyphoscoliosis present with associated degenerative change. No compression deformity is seen. IMPRESSION: 1. Moderate sized right pleural effusion with adjacent  fractures involving the anterolateral right sixth, seventh, eighth, ninth, and tenth ribs. No pneumothorax. 2. Slight fullness of the pelvocaliceal systems may be due to parapelvic renal cysts. Mild hydronephrosis cannot be excluded. 3. Significant thoracic and abdominal aortic atherosclerosis and diffuse coronary artery disease. 4. Thoracolumbar kyphoscoliosis with associated degenerative change. 5. Centrilobular emphysema. Electronically Signed   By: Dwyane Dee M.D.   On: 10/27/2018 14:34   US Renal  Result Date: 10/28/2018 CLINICAL DATA:  84 year old female with hydronephrosis EXAM: RENAL / URINARY TRACT ULTRASOUND COMPLETE COMPARISON:  CT Abdomen and Pelvis 10/27/2018, 09/17/2016 FINDINGS: Right Kidney: Renal measurements: 7.7 x 3.2 x 3.9 centimeters = volume: 50 mL. Echogenic right renal cortex (image 5) with no hydronephrosis or right renal mass. Rather, extrarenal pelvis is suspected and this appears similar to the 2017 CT appearance. Left Kidney: Renal measurements: 8.0 x 3.4 x 3.7 centimeters = volume: 51 mL. Left renal cortical echogenicity is probably also increased. No left hydronephrosis or renal mass. Mild left extrarenal pelvis also appears stable since 2017. Bladder: Negative aside from suggestion of multiple bladder diverticula (image 51). Both ureteral jets are detected with Doppler. IMPRESSION: 1. Negative for hydronephrosis. Rather, right greater than left extrarenal pelvis is suspected (normal variant) and appears stable since 2017. 2. Possible bladder diverticula. Electronically Signed   By: Odessa Fleming M.D.   On: 10/28/2018 11:18    EKG:   Orders placed or performed during the hospital encounter of 10/27/18  . ED EKG  . ED EKG      Management plans discussed with the patient, family and they are in agreement.  CODE STATUS:     Code Status Orders  (From admission, onward)         Start     Ordered   10/27/18 1928  Do not attempt resuscitation (DNR)  Continuous     Question Answer Comment  In the event of cardiac or respiratory ARREST Do not call a "code blue"   In the event of cardiac or respiratory ARREST Do not perform Intubation, CPR, defibrillation or ACLS   In the event of cardiac or respiratory ARREST Use medication by any route, position, wound care, and other measures to relive pain and suffering. May use oxygen, suction and manual treatment of airway obstruction as needed for comfort.   Comments RN may pronounce      10/27/18 1927        Code Status History  Date Active Date Inactive Code Status Order ID Comments User Context   06/20/2018 0250 06/23/2018 1502 Full Code [id number]  Barbaraann Rondo, MD Inpatient   10/23/2015 2208 10/25/2015 1948 Full Code [id number]  Enid Baas, MD Inpatient    Advance Directive Documentation     Most Recent Value  Type of Advance Directive  Healthcare Power of Attorney, Living will  Pre-existing out of facility DNR order (yellow form or pink MOST form)  -  "MOST" Form in Place?  -      TOTAL TIME TAKING CARE OF THIS PATIENT: 45  minutes.   Note: This dictation was prepared with Dragon dictation along with smaller phrase technology. Any transcriptional errors that result from this process are unintentional.   @[facility] @  on 10/30/2018 at 12:46 PM  Between 7am to 6pm - Pager - [phone]  After 6pm go to [email or web address] - password [email or web address] [facility]  [facility]  Office  [phone]  CC: Primary care physician; Barbette Reichmann, MD

## 2018-10-30 NOTE — Progress Notes (Signed)
Attempted to qualify patient for home oxygen, currently patient is too lethargic and refuses to ambulate with RN at this time. Assured patient and family that we will try again in an hour or so and let the patient rest. Of note,patient has not received any narcotic pain medication this shift. Will continue to assess, and encourage patient to ambulate.

## 2018-10-30 NOTE — Progress Notes (Signed)
Physical Therapy Treatment Patient Details Name: [name] MRN: [id number] DOB: [birth year] Today's Date: 10/30/2018    History of Present Illness presented to ER s/p fall in home environment; admitted for  management of A/C CHF, moderate pleural effusion and multiple r-sided rib fractures (6-10)    PT Comments    Gradual increase in gait distance, but remains generally guarded (very slow and deliberate in movement) due to pain.  With increased time, able to complete bed mobility, sit/stand, basic transfers and gait (12') with min assist; however, requires multiple standing rest breaks, increased time to do so. Continue to recommend RW and +1 physical assist at all times. Of note, patient likely to benefit from home O2 at discharge; currently requiring 2L supplemental O2 to maintain appropriate sats with exertion.  RN informed/aware.  Did discuss with RNCM and requested addition of HHOT, HHRN and HHaide to home health services as well.  SaO2 on room air at rest = 92% SaO2 on room air while ambulating = 87% SaO2 on 2 liters of O2 while ambulating = 92%     Follow Up Recommendations  SNF     Equipment Recommendations       Recommendations for Other Services       Precautions / Restrictions Precautions Precautions: Fall Restrictions Weight Bearing Restrictions: No    Mobility  Bed Mobility Overal bed mobility: Needs Assistance Bed Mobility: Supine to Sit     Supine to sit: Min assist     General bed mobility comments: very slow and guarded; improved tolerance with transition towards L side of bed  Transfers Overall transfer level: Needs assistance Equipment used: Rolling walker (2 wheeled) Transfers: Sit to/from Stand Sit to Stand: Min assist         General transfer comment: maintains L LE anterior to BOS (PF contracture), assist for lift off, stabilization; requires UE support and +1 to safely complete  Ambulation/Gait Ambulation/Gait assistance: Min  assist Gait Distance (Feet): 12 Feet Assistive device: Rolling walker (2 wheeled)       General Gait Details: 3-point, step to gait pattern leading with L LE (unable to achieve foot flat); very slow and deliberate with multiple standing rest breaks throughout limited distance   Stairs             Wheelchair Mobility    Modified Rankin (Stroke Patients Only)       Balance Overall balance assessment: Needs assistance Sitting-balance support: No upper extremity supported;Feet supported Sitting balance-Leahy Scale: Fair     Standing balance support: Bilateral upper extremity supported Standing balance-Leahy Scale: Fair                              Cognition Arousal/Alertness: Awake/alert Behavior During Therapy: WFL for tasks assessed/performed Overall Cognitive Status: Within Functional Limits for tasks assessed                                        Exercises Other Exercises Other Exercises: Toilet transfer, SPT with RW, mod assist; sit/stand from [facility] with RW, mod assist.  Dep assist for clothing management, hygiene. Other Exercises: Educated patient/family in importance of deep breathing, pursed lip breathing with activity; all voiced understanding.    General Comments        Pertinent Vitals/Pain Pain Assessment: Faces Faces Pain Scale: Hurts whole lot Pain Location: R flank/ribs  Pain Descriptors / Indicators: Aching;Grimacing;Guarding Pain Intervention(s): Limited activity within patient's tolerance;Monitored during session;Repositioned    Home Living                      Prior Function            PT Goals (current goals can now be found in the care plan section) Acute Rehab PT Goals Patient Stated Goal: to try to go to the bathroom PT Goal Formulation: With patient/family Time For Goal Achievement: 11/12/18 Potential to Achieve Goals: Good Progress towards PT goals: Progressing toward goals     Frequency    Min 2X/week      PT Plan Current plan remains appropriate    Co-evaluation              AM-PAC PT "6 Clicks" Mobility   Outcome Measure  Help needed turning from your back to your side while in a flat bed without using bedrails?: A Little Help needed moving from lying on your back to sitting on the side of a flat bed without using bedrails?: A Little Help needed moving to and from a bed to a chair (including a wheelchair)?: A Little Help needed standing up from a chair using your arms (e.g., wheelchair or bedside chair)?: A Little Help needed to walk in hospital room?: A Little Help needed climbing 3-5 steps with a railing? : A Lot 6 Click Score: 17    End of Session Equipment Utilized During Treatment: Gait belt Activity Tolerance: Patient limited by pain Patient left: in bed;with call bell/phone within reach;with bed alarm set;with family/visitor present Nurse Communication: Mobility status PT Visit Diagnosis: Unsteadiness on feet (R26.81);Muscle weakness (generalized) (M62.81);Difficulty in walking, not elsewhere classified (R26.2)     Time: [id number] PT Time Calculation (min) (ACUTE ONLY): 33 min  Charges:  $Gait Training: 8-22 mins $Therapeutic Activity: 8-22 mins                     Loudon Krakow H. Manson PasseyBrown, PT, DPT, NCS 10/30/18, 4:10 PM [phone]

## 2018-10-30 NOTE — Care Management Important Message (Signed)
Initial Medicare signed by POA.  Copy left in room.

## 2018-10-30 NOTE — Discharge Instructions (Signed)
Follow-up with primary care physician in 4 to 5 days With cardiology Dr. Lady GaryFath in a week Outpatient CHF clinic in 3 days Daily weight monitoring, intake and output

## 2018-10-30 NOTE — Progress Notes (Signed)
SATURATION QUALIFICATIONS: (This note is used to comply with regulatory documentation for home oxygen)  Patient Saturations on Room Air at Rest = 93%  Patient Saturations on Room Air while Ambulating = 87%  Patient Saturations on 2 Liters of oxygen while Ambulating = 94%  Please briefly explain why patient needs home oxygen: 

## 2018-10-30 NOTE — Progress Notes (Addendum)
Pt external catheter have an urinary output of 70 ml. Bladder scan showed 150. Pt states she does not have the urge to pee right now. Will continue to monitor.  Update 0545: Pt have urinary output of using BSC. Will continue to monitor.

## 2018-10-30 NOTE — Progress Notes (Signed)
Pt discharged home today per MD order, pt very weak, MD aware, VSS, home oxygen in place at this time. Family and patient provided with follow up appointment instructions and medication instructions. IV removed.

## 2018-10-30 NOTE — Care Management (Addendum)
Patient for discharge home today. Verbally confirmed with daughter at bedside declining skilled nursing placement.  Daughter  will be with patient 24/7 will transport home and denies issues getting patient into the house.  Patient is currently wearing oxygen which is acute.  CM discussed having home oxygen assessment performed prior to discharge with daughter and she hopes patient will not require home oxygen.  Discussed that when patient arrived she was hypoxic and  CM would be remiss in job responsibilities if did not have home oxygen assessment performed.  Daughter verbalizes understanding.  Updated primary nurse. Contacted Cassie with [facility] with informed of orders for RN PT Aide and SW.  Agency will see patient 11/30.

## 2018-10-30 NOTE — Progress Notes (Signed)
Requested physical therapy Baxter HireKristen to reassess patient regarding the need for oxygen with mobility. WILL ARRANGE home o2 if pt is qualified

## 2018-11-03 ENCOUNTER — Ambulatory Visit: Payer: Medicare Other

## 2019-04-16 ENCOUNTER — Other Ambulatory Visit: Payer: Self-pay

## 2019-04-16 ENCOUNTER — Other Ambulatory Visit
Admission: RE | Admit: 2019-04-16 | Discharge: 2019-04-16 | Disposition: A | Discharge status: Home / Self Care | Payer: Medicare Other | Source: Ambulatory Visit | Attending: Physician Assistant | Admitting: Physician Assistant

## 2019-04-16 ENCOUNTER — Encounter: Payer: Medicare Other | Attending: Physician Assistant | Admitting: Physician Assistant

## 2019-04-16 DIAGNOSIS — L97322 Non-pressure chronic ulcer of left ankle with fat layer exposed: Secondary | ICD-10-CM | POA: Insufficient documentation

## 2019-04-16 DIAGNOSIS — I482 Chronic atrial fibrillation, unspecified: Secondary | ICD-10-CM | POA: Insufficient documentation

## 2019-04-16 DIAGNOSIS — Z87891 Personal history of nicotine dependence: Secondary | ICD-10-CM | POA: Insufficient documentation

## 2019-04-16 DIAGNOSIS — I739 Peripheral vascular disease, unspecified: Secondary | ICD-10-CM | POA: Diagnosis not present

## 2019-04-16 DIAGNOSIS — I10 Essential (primary) hypertension: Secondary | ICD-10-CM | POA: Diagnosis not present

## 2019-04-16 DIAGNOSIS — Z7902 Long term (current) use of antithrombotics/antiplatelets: Secondary | ICD-10-CM | POA: Insufficient documentation

## 2019-04-16 DIAGNOSIS — M01X79 Direct infection of unspecified ankle and foot in infectious and parasitic diseases classified elsewhere: Secondary | ICD-10-CM | POA: Diagnosis present

## 2019-04-16 DIAGNOSIS — Z95820 Peripheral vascular angioplasty status with implants and grafts: Secondary | ICD-10-CM | POA: Insufficient documentation

## 2019-04-16 NOTE — Progress Notes (Signed)
Cooprider, Idelle T. ([id number]) Visit Report for 04/16/2019 Abuse/Suicide Risk Screen Details Patient Name: [name], [name] Date of Service: 04/16/2019 12:45 PM Medical Record Number: [id number] Patient Account Number: [id number] Date of Birth/Sex: 06/28/1925 (85 y.Good. F) Treating RN: Arnette NorrisBiell, Kristina Primary Care Jonda Alanis: Barbette ReichmannHande, Vishwanath Other Clinician: Referring Mallory Enriques: Barbette ReichmannHande, Vishwanath Treating Sheridyn Canino/Extender: Linwood DibblesSTONE III, HOYT Weeks in Danielle Oppenheimreatment: 0 Abuse/Suicide Risk Screen Items Answer ABUSE RISK SCREEN: Has anyone close to you tried to hurt or harm you recentlyo No Do you feel uncomfortable with anyone in your familyo No Has anyone forced you do things that you didnot want to doo No Electronic Signature(s) Signed: 04/16/2019 4:17:13 PM By: Arnette NorrisBiell, Kristina Entered By: Arnette NorrisBiell, Kristina on 04/16/2019 13:13:45 [name], [name] ([id number]) -------------------------------------------------------------------------------- Activities of Daily Living Details Patient Name: [name], [name] Date of Service: 04/16/2019 12:45 PM Medical Record Number: [id number] Patient Account Number: [id number] Date of Birth/Sex: 05/17/1925 (85 y.Good. F) Treating RN: Arnette NorrisBiell, Kristina Primary Care Oshae Simmering: Barbette ReichmannHande, Vishwanath Other Clinician: Referring Purva Vessell: Barbette ReichmannHande, Vishwanath Treating Demba Nigh/Extender: Linwood DibblesSTONE III, HOYT Weeks in Treatment: 0 Activities of Daily Living Items Answer Activities of Daily Living (Please select one for each item) Drive Automobile Not Able Take Medications Need Assistance Use Telephone Need Assistance Care for Appearance Need Assistance Use Toilet Need Assistance Bath / Shower Need Assistance Dress Self Need Assistance Feed Self Completely Able Walk Need Assistance Get In / Out Bed Need Assistance Housework Not Able Prepare Meals Not Able Handle Money Need Assistance Shop for Self Need Assistance Electronic Signature(s) Signed: 04/16/2019 4:17:13 PM By: Arnette NorrisBiell,  Kristina Entered By: Arnette NorrisBiell, Kristina on 04/16/2019 13:14:47 [name], [name] ([id number]) -------------------------------------------------------------------------------- Education Screening Details Patient Name: [name], [name] Date of Service: 04/16/2019 12:45 PM Medical Record Number: [id number] Patient Account Number: [id number] Date of Birth/Sex: 12/15/1924 (85 y.Good. F) Treating RN: Arnette NorrisBiell, Kristina Primary Care Chanc Kervin: Barbette ReichmannHande, Vishwanath Other Clinician: Referring Karley Pho: Barbette ReichmannHande, Vishwanath Treating Hyun Marsalis/Extender: Linwood DibblesSTONE III, HOYT Weeks in Treatment: 0 Primary Learner Assessed: Caregiver Reason Patient is not Primary Learner: HOH Learning Preferences/Education Level/Primary Language Learning Preference: Explanation Highest Education Level: High School Preferred Language: English Cognitive Barrier Language Barrier: No Translator Needed: No Memory Deficit: No Emotional Barrier: No Cultural/Religious Beliefs Affecting Medical Care: No Physical Barrier Impaired Vision: No Impaired Hearing: No Decreased Hand dexterity: No Knowledge/Comprehension Knowledge Level: High Comprehension Level: High Ability to understand written High instructions: Ability to understand verbal High instructions: Motivation Anxiety Level: Calm Cooperation: Cooperative Education Importance: Acknowledges Need Interest in Health Problems: Asks Questions Perception: Coherent Willingness to Engage in Self- High Management Activities: Readiness to Engage in Self- High Management Activities: Electronic Signature(s) Signed: 04/16/2019 4:17:13 PM By: Arnette NorrisBiell, Kristina Entered By: Arnette NorrisBiell, Kristina on 04/16/2019 13:15:32 [name], [name] ([id number]) -------------------------------------------------------------------------------- Fall Risk Assessment Details Patient Name: [name], [name] Date of Service: 04/16/2019 12:45 PM Medical Record Number: [id number] Patient Account Number:  [id number] Date of Birth/Sex: 10/29/1925 (85 y.Good. F) Treating RN: Arnette NorrisBiell, Kristina Primary Care Manas Hickling: Barbette ReichmannHande, Vishwanath Other Clinician: Referring Deontrey Massi: Barbette ReichmannHande, Vishwanath Treating Kailany Dinunzio/Extender: Linwood DibblesSTONE III, HOYT Weeks in Treatment: 0 Fall Risk Assessment Items Have you had 2 or more falls in the last 12 monthso 0 Yes Have you had any fall that resulted in injury in the last 12 monthso 0 Yes FALLS RISK SCREEN History of falling - immediate or within 3 months 0 No Secondary diagnosis (Do you have 2 or more medical diagnoseso) 0 No Ambulatory aid None/bed rest/wheelchair/nurse 0 No Crutches/cane/walker 15 Yes Furniture 0 No Intravenous therapy Access/Saline/Heparin Lock 0 No Gait/Transferring Normal/  bed rest/ wheelchair 0 No Weak (short steps with or without shuffle, stooped but able to lift head while 10 Yes walking, may seek support from furniture) Impaired (short steps with shuffle, may have difficulty arising from chair, head 0 No down, impaired balance) Mental Status Oriented to own ability 0 No Electronic Signature(s) Signed: 04/16/2019 4:17:13 PM By: Arnette Norris Entered By: Arnette Norris on 04/16/2019 13:16:56 Danielle Oppenheim ([id number]) -------------------------------------------------------------------------------- Foot Assessment Details Patient Name: Danielle Coco T. Date of Service: 04/16/2019 12:45 PM Medical Record Number: [id number] Patient Account Number: [id number] Date of Birth/Sex: September 12, 1925 (85 y.Good. F) Treating RN: Arnette Norris Primary Care Stachia Slutsky: Barbette Reichmann Other Clinician: Referring Kyliyah Stirn: Barbette Reichmann Treating Alphonsus Doyel/Extender: Linwood Dibbles, HOYT Weeks in Treatment: 0 Foot Assessment Items Site Locations + = Sensation present, - = Sensation absent, C = Callus, U = Ulcer R = Redness, W = Warmth, M = Maceration, PU = Pre-ulcerative lesion F = Fissure, S = Swelling, D = Dryness Assessment Right: Left: Other  Deformity: No No Prior Foot Ulcer: No No Prior Amputation: No No Charcot Joint: No No Ambulatory Status: Ambulatory With Help Assistance Device: Walker Gait: Surveyor, mining) Signed: 04/16/2019 4:17:13 PM By: Arnette Norris Entered By: Arnette Norris on 04/16/2019 13:18:58 Danielle Oppenheim ([id number]) -------------------------------------------------------------------------------- Nutrition Risk Screening Details Patient Name: Danielle Coco T. Date of Service: 04/16/2019 12:45 PM Medical Record Number: [id number] Patient Account Number: [id number] Date of Birth/Sex: 29-Jan-1925 (85 y.Good. F) Treating RN: Arnette Norris Primary Care Kye Hedden: Barbette Reichmann Other Clinician: Referring Amelda Hapke: Barbette Reichmann Treating Chiara Coltrin/Extender: Linwood Dibbles, HOYT Weeks in Treatment: 0 Height (in): 59 Weight (lbs): 98 Body Mass Index (BMI): 19.8 Nutrition Risk Screening Items Score Screening NUTRITION RISK SCREEN: I have an illness or condition that made me change the kind and/or amount of 0 No food I eat I eat fewer than two meals per day 0 No I eat few fruits and vegetables, or milk products 0 No I have three or more drinks of beer, liquor or wine almost every day 0 No I have tooth or mouth problems that make it hard for me to eat 0 No I don't always have enough money to buy the food I need 0 No I eat alone most of the time 0 No I take three or more different prescribed or over-the-counter drugs a day 1 Yes Without wanting to, I have lost or gained 10 pounds in the last six months 0 No I am not always physically able to shop, cook and/or feed myself 0 No Nutrition Protocols Good Risk Protocol Moderate Risk Protocol High Risk Proctocol Risk Level: Good Risk Score: 1 Electronic Signature(s) Signed: 04/16/2019 4:17:13 PM By: Arnette Norris Entered By: Arnette Norris on 04/16/2019 13:17:13

## 2019-04-16 NOTE — Progress Notes (Addendum)
Danielle, Good ([id number]) Visit Report for 04/16/2019 Allergy List Details Patient Name: [name], [name]. Date of Service: 04/16/2019 12:45 PM Medical Record Number: [id number] Patient Account Number: [id number] Date of Birth/Sex: 1925/03/18 (85 y.o. F) Treating RN: Arnette Norris Primary Care Derin Granquist: Barbette Reichmann Other Clinician: Referring Jashawn Floyd: Barbette Reichmann Treating Marvie Calender/Extender: Linwood Dibbles, HOYT Weeks in Treatment: 0 Allergies Active Allergies oxycodone Reaction: unknown Fosamax Reaction: unknown minocycline Reaction: nausea and vomiting Severity: Mild Sulfa (Sulfonamide Antibiotics) Reaction: nausea and vomiting Severity: Mild doxycycline Reaction: nausea and vomiting Allergy Notes Electronic Signature(s) Signed: 04/16/2019 4:17:13 PM By: Arnette Norris Previous Signature: 04/16/2019 10:33:16 AM Version By: Arnette Norris Entered By: Arnette Norris on 04/16/2019 13:05:42 [name] ([id number]) -------------------------------------------------------------------------------- Arrival Information Details Patient Name: Danielle Coco T. Date of Service: 04/16/2019 12:45 PM Medical Record Number: [id number] Patient Account Number: [id number] Date of Birth/Sex: [birth year] (85 y.o. F) Treating RN: Arnette Norris Primary Care [name]: Barbette Reichmann Other Clinician: Referring [name]: Barbette Reichmann Treating [name]/Extender: Linwood Dibbles, HOYT Weeks in Treatment: 0 Visit Information Patient Arrived: Wheel Chair Arrival Time: 12:49 Accompanied By: daughter Transfer Assistance: EasyPivot Patient Lift Patient Identification Verified: Yes Secondary Verification Process Yes Completed: Electronic Signature(s) Signed: 04/16/2019 4:17:13 PM By: Arnette Norris Entered By: Arnette Norris on 04/16/2019 13:02:55 [name] ([id number]) -------------------------------------------------------------------------------- Clinic Level of  Care Assessment Details Patient Name: Danielle Coco T. Date of Service: 04/16/2019 12:45 PM Medical Record Number: [id number] Patient Account Number: [id number] Date of Birth/Sex: 26-Apr-1925 (85 y.o. F) Treating RN: Curtis Sites Primary Care Jimma Ortman: Barbette Reichmann Other Clinician: Referring Amarri Michaelson: Barbette Reichmann Treating Evo Aderman/Extender: Linwood Dibbles, HOYT Weeks in Treatment: 0 Clinic Level of Care Assessment Items TOOL 2 Quantity Score []  - Use when only an EandM is performed on the INITIAL visit 0 ASSESSMENTS - Nursing Assessment / Reassessment X - General Physical Exam (combine w/ comprehensive assessment (listed just below) when 1 20 performed on new pt. evals) X- 1 25 Comprehensive Assessment (HX, ROS, Risk Assessments, Wounds Hx, etc.) ASSESSMENTS - Wound and Skin Assessment / Reassessment X - Simple Wound Assessment / Reassessment - one wound 1 5 []  - 0 Complex Wound Assessment / Reassessment - multiple wounds []  - 0 Dermatologic / Skin Assessment (not related to wound area) ASSESSMENTS - Ostomy and/or Continence Assessment and Care []  - Incontinence Assessment and Management 0 []  - 0 Ostomy Care Assessment and Management (repouching, etc.) PROCESS - Coordination of Care []  - Simple Patient / Family Education for ongoing care 0 X- 1 20 Complex (extensive) Patient / Family Education for ongoing care X- 1 10 Staff obtains Chiropractor, Records, Test Results / Process Orders []  - 0 Staff telephones HHA, Nursing Homes / Clarify orders / etc []  - 0 Routine Transfer to another Facility (non-emergent condition) []  - 0 Routine Hospital Admission (non-emergent condition) X- 1 15 New Admissions / Manufacturing engineer / Ordering NPWT, Apligraf, etc. []  - 0 Emergency Hospital Admission (emergent condition) []  - 0 Simple Discharge Coordination X- 1 15 Complex (extensive) Discharge Coordination PROCESS - Special Needs []  - Pediatric / Minor Patient Management  0 []  - 0 Isolation Patient Management DANEKA, MCNEAR ([id number]) []  - 0 Hearing / Language / Visual special needs []  - 0 Assessment of Community assistance (transportation, D/C planning, etc.) []  - 0 Additional assistance / Altered mentation []  - 0 Support Surface(s) Assessment (bed, cushion, seat, etc.) INTERVENTIONS - Wound Cleansing / Measurement X - Wound Imaging (photographs - any number of wounds) 1 5 []  - 0 Wound Tracing (  instead of photographs) X- 1 5 Simple Wound Measurement - one wound []  - 0 Complex Wound Measurement - multiple wounds X- 1 5 Simple Wound Cleansing - one wound []  - 0 Complex Wound Cleansing - multiple wounds INTERVENTIONS - Wound Dressings X - Small Wound Dressing one or multiple wounds 1 10 []  - 0 Medium Wound Dressing one or multiple wounds []  - 0 Large Wound Dressing one or multiple wounds []  - 0 Application of Medications - injection INTERVENTIONS - Miscellaneous []  - External ear exam 0 X- 1 5 Specimen Collection (cultures, biopsies, blood, body fluids, etc.) X- 1 5 Specimen(s) / Culture(s) sent or taken to Lab for analysis []  - 0 Patient Transfer (multiple staff / Nurse, adultHoyer Lift / Similar devices) []  - 0 Simple Staple / Suture removal (25 or less) []  - 0 Complex Staple / Suture removal (26 or more) []  - 0 Hypo / Hyperglycemic Management (close monitor of Blood Glucose) X- 1 15 Ankle / Brachial Index (ABI) - do not check if billed separately Has the patient been seen at the hospital within the last three years: Yes Total Score: 160 Level Of Care: New/Established - Level 5 Electronic Signature(s) Signed: 04/16/2019 4:26:01 PM By: Curtis Sitesorthy, Joanna Entered By: Curtis Sitesorthy, Joanna on 04/16/2019 14:02:18 Danielle OppenheimGRAHAM, Kalisi T. ([id number]) -------------------------------------------------------------------------------- Encounter Discharge Information Details Patient Name: Danielle CocoGRAHAM, [name] Date of Service: 04/16/2019 12:45 PM Medical Record  Number: [id number] Patient Account Number: [id number] Date of Birth/Sex: 05/28/1925 (85 y.o. F) Treating RN: Curtis Sitesorthy, Joanna Primary Care Alixis Harmon: Barbette ReichmannHande, Vishwanath Other Clinician: Referring Valyncia Wiens: Barbette ReichmannHande, Vishwanath Treating Bryon Parker/Extender: Linwood DibblesSTONE III, HOYT Weeks in Treatment: 0 Encounter Discharge Information Items Discharge Condition: Stable Ambulatory Status: Wheelchair Discharge Destination: Home Transportation: Private Auto Accompanied By: daughter Schedule Follow-up Appointment: Yes Clinical Summary of Care: Electronic Signature(s) Signed: 04/16/2019 4:26:01 PM By: Curtis Sitesorthy, Joanna Entered By: Curtis Sitesorthy, Joanna on 04/16/2019 14:15:28 Danielle OppenheimGRAHAM, Prezley T. ([id number]) -------------------------------------------------------------------------------- Lower Extremity Assessment Details Patient Name: Danielle CocoGRAHAM, Nicoletta T. Date of Service: 04/16/2019 12:45 PM Medical Record Number: [id number] Patient Account Number: [id number] Date of Birth/Sex: 01/25/1925 (85 y.o. F) Treating RN: Arnette NorrisBiell, Kristina Primary Care Charisma Charlot: Barbette ReichmannHande, Vishwanath Other Clinician: Referring Lizvette Lightsey: Barbette ReichmannHande, Vishwanath Treating Jaedin Regina/Extender: Linwood DibblesSTONE III, HOYT Weeks in Treatment: 0 Edema Assessment Assessed: [Left: No] [Right: No] Edema: [Left: Ye] [Right: s] Calf Left: Right: Point of Measurement: 29 cm From Medial Instep 28.5 cm 29.5 cm Ankle Left: Right: Point of Measurement: 12 cm From Medial Instep 18.6 cm 19 cm Vascular Assessment Pulses: Dorsalis Pedis Palpable: [Left:Yes] Posterior Tibial Palpable: [Left:Yes] Blood Pressure: Brachial: [Right:130] Ankle: [Right:Dorsalis Pedis: 100 0.77] Notes Left ABI= non-compressible Electronic Signature(s) Signed: 04/16/2019 4:17:13 PM By: Arnette NorrisBiell, Kristina Entered By: Arnette NorrisBiell, Kristina on 04/16/2019 13:27:11 Danielle OppenheimGRAHAM, Brynlea T. ([id number]) -------------------------------------------------------------------------------- Multi Wound Chart Details Patient Name:  Danielle CocoGRAHAM, Lakashia T. Date of Service: 04/16/2019 12:45 PM Medical Record Number: [id number] Patient Account Number: [id number] Date of Birth/Sex: 01/16/1925 (85 y.o. F) Treating RN: Curtis Sitesorthy, Joanna Primary Care Zarif Rathje: Barbette ReichmannHande, Vishwanath Other Clinician: Referring Ina Poupard: Barbette ReichmannHande, Vishwanath Treating Elspeth Blucher/Extender: Linwood DibblesSTONE III, HOYT Weeks in Treatment: 0 Vital Signs Height(in): 59 Pulse(bpm): 60 Weight(lbs): 98 Blood Pressure(mmHg): 130/72 Body Mass Index(BMI): 20 Temperature(F): Respiratory Rate 20 (breaths/min): Photos: [N/A:N/A] Wound Location: Left Malleolus - Lateral N/A N/A Wounding Event: Gradually Appeared N/A N/A Primary Etiology: Arterial Insufficiency Ulcer N/A N/A Comorbid History: Arrhythmia, Hypertension, N/A N/A Peripheral Venous Disease, History of pressure wounds Date Acquired: 01/31/2019 N/A N/A Weeks of Treatment: 0 N/A N/A Wound Status: Open N/A N/A Clustered Wound: Yes N/A N/A Clustered Quantity:  4 N/A N/A Measurements L x W x D 3.3x1.8x0.2 N/A N/A (cm) Area (cm) : 4.665 N/A N/A Volume (cm) : 0.933 N/A N/A Classification: Full Thickness Without N/A N/A Exposed Support Structures Exudate Amount: Medium N/A N/A Exudate Type: Serous N/A N/A Exudate Color: amber N/A N/A Wound Margin: Flat and Intact N/A N/A Granulation Amount: Small (1-33%) N/A N/A Granulation Quality: Red N/A N/A Necrotic Amount: Large (67-100%) N/A N/A Exposed Structures: Fat Layer (Subcutaneous N/A N/A Tissue) Exposed: Yes Fascia: No Tendon: No Muscle: No TANAI, BOULER T. ([id number]) Joint: No Bone: No Epithelialization: None N/A N/A Periwound Skin Texture: Excoriation: No N/A N/A Induration: No Callus: No Crepitus: No Rash: No Scarring: No Periwound Skin Moisture: Maceration: No N/A N/A Dry/Scaly: No Periwound Skin Color: Erythema: Yes N/A N/A Hemosiderin Staining: Yes Atrophie Blanche: No Cyanosis: No Ecchymosis: No Mottled: No Pallor: No Rubor:  No Erythema Location: Circumferential N/A N/A Temperature: No Abnormality N/A N/A Tenderness on Palpation: Yes N/A N/A Treatment Notes Electronic Signature(s) Signed: 04/16/2019 4:26:01 PM By: Curtis Sites Entered By: Curtis Sites on 04/16/2019 13:45:07 [name] ([id number]) -------------------------------------------------------------------------------- Multi-Disciplinary Care Plan Details Patient Name: Danielle Coco T. Date of Service: 04/16/2019 12:45 PM Medical Record Number: [id number] Patient Account Number: [id number] Date of Birth/Sex: December 12, 1924 (85 y.o. F) Treating RN: Curtis Sites Primary Care Nihal Doan: Barbette Reichmann Other Clinician: Referring Liban Guedes: Barbette Reichmann Treating Kieffer Blatz/Extender: Linwood Dibbles, HOYT Weeks in Treatment: 0 Active Inactive Abuse / Safety / Falls / Self Care Management Nursing Diagnoses: Potential for falls Goals: Patient will not experience any injury related to falls Date Initiated: 04/16/2019 Target Resolution Date: 07/10/2019 Goal Status: Active Interventions: Assess fall risk on admission and as needed Notes: Nutrition Nursing Diagnoses: Potential for alteratiion in Nutrition/Potential for imbalanced nutrition Goals: Patient/caregiver agrees to and verbalizes understanding of need to use nutritional supplements and/or vitamins as prescribed Date Initiated: 04/16/2019 Target Resolution Date: 07/10/2019 Goal Status: Active Interventions: Assess patient nutrition upon admission and as needed per policy Notes: Orientation to the Wound Care Program Nursing Diagnoses: Knowledge deficit related to the wound healing center program Goals: Patient/caregiver will verbalize understanding of the Wound Healing Center Program Date Initiated: 04/16/2019 Target Resolution Date: 07/10/2019 Goal Status: Active Interventions: Provide education on orientation to the wound center [place], Jule Ser T. ([id number]) Notes: Wound/Skin  Impairment Nursing Diagnoses: Impaired tissue integrity Goals: Ulcer/skin breakdown will heal within 14 weeks Date Initiated: 04/16/2019 Target Resolution Date: 07/10/2019 Goal Status: Active Interventions: Assess patient/caregiver ability to obtain necessary supplies Assess patient/caregiver ability to perform ulcer/skin care regimen upon admission and as needed Assess ulceration(s) every visit Notes: Electronic Signature(s) Signed: 04/16/2019 4:26:01 PM By: Curtis Sites Entered By: Curtis Sites on 04/16/2019 13:44:56 [name] ([id number]) -------------------------------------------------------------------------------- Pain Assessment Details Patient Name: Danielle Coco T. Date of Service: 04/16/2019 12:45 PM Medical Record Number: [id number] Patient Account Number: [id number] Date of Birth/Sex: February 27, 1925 (85 y.o. F) Treating RN: Arnette Norris Primary Care Venita Seng: Barbette Reichmann Other Clinician: Referring Alianny Toelle: Barbette Reichmann Treating Rachele Lamaster/Extender: Linwood Dibbles, HOYT Weeks in Treatment: 0 Active Problems Location of Pain Severity and Description of Pain Patient Has Paino Yes Site Locations Rate the pain. Current Pain Level: 8 Pain Management and Medication Current Pain Management: Electronic Signature(s) Signed: 04/16/2019 4:17:13 PM By: Arnette Norris Entered By: Arnette Norris on 04/16/2019 13:04:34 [name] ([id number]) -------------------------------------------------------------------------------- Patient/Caregiver Education Details Patient Name: Danielle Coco T. Date of Service: 04/16/2019 12:45 PM Medical Record Number: [id number] Patient Account Number: [id number] Date of Birth/Gender: [birth year] (85 y.o. F) Treating RN:  Curtis Sites Primary Care Physician: Barbette Reichmann Other Clinician: Referring Physician: Barbette Reichmann Treating Physician/Extender: Skeet Simmer in Treatment: 0 Education  Assessment Education Provided To: Patient and Caregiver Education Topics Provided Wound/Skin Impairment: Handouts: Other: wound care as ordered Methods: Demonstration, Explain/Verbal Responses: State content correctly Electronic Signature(s) Signed: 04/16/2019 4:26:01 PM By: Curtis Sites Entered By: Curtis Sites on 04/16/2019 14:02:44 [name] ([id number]) -------------------------------------------------------------------------------- Wound Assessment Details Patient Name: Danielle Coco T. Date of Service: 04/16/2019 12:45 PM Medical Record Number: [id number] Patient Account Number: [id number] Date of Birth/Sex: [birth year] (85 y.o. F) Treating RN: Curtis Sites Primary Care Ellie Spickler: Barbette Reichmann Other Clinician: Referring Jakarie Pember: Barbette Reichmann Treating Lamoine Magallon/Extender: Linwood Dibbles, HOYT Weeks in Treatment: 0 Wound Status Wound Number: 1 Primary Arterial Insufficiency Ulcer Etiology: Wound Location: Left Malleolus - Lateral Wound Open Wounding Event: Gradually Appeared Status: Date Acquired: 01/31/2019 Comorbid Arrhythmia, Hypertension, Peripheral Venous Weeks Of Treatment: 0 History: Disease, History of pressure wounds Clustered Wound: Yes Photos Wound Measurements Length: (cm) 3.3 % Reduct Width: (cm) 1.8 % Reduct Depth: (cm) 0.2 Epitheli Clustered Quantity: 4 Tunnelin Area: (cm) 4.665 Undermi Volume: (cm) 0.933 ion in Area: ion in Volume: alization: None g: No ning: No Wound Description Full Thickness Without Exposed Support Foul Odo Classification: Structures Slough/F Wound Margin: Flat and Intact Exudate Medium Amount: Exudate Type: Serous Exudate Color: amber r After Cleansing: No ibrino Yes Wound Bed Granulation Amount: Small (1-33%) Exposed Structure Granulation Quality: Red Fascia Exposed: No Necrotic Amount: Large (67-100%) Fat Layer (Subcutaneous Tissue) Exposed: Yes Necrotic Quality: Adherent Slough Tendon  Exposed: No Muscle Exposed: No Joint Exposed: No Bone Exposed: No [place], Hehn Jowanna T. ([id number]) Periwound Skin Texture Texture Color No Abnormalities Noted: No No Abnormalities Noted: No Callus: No Atrophie Blanche: No Crepitus: No Cyanosis: No Excoriation: No Ecchymosis: No Induration: No Erythema: Yes Rash: No Erythema Location: Circumferential Scarring: No Hemosiderin Staining: Yes Mottled: No Moisture Pallor: No No Abnormalities Noted: No Rubor: No Dry / Scaly: No Maceration: No Temperature / Pain Temperature: No Abnormality Tenderness on Palpation: Yes Treatment Notes Wound #1 (Left, Lateral Malleolus) Notes silvercel, bordered foam dressing Electronic Signature(s) Signed: 04/16/2019 1:29:32 PM By: Curtis Sites Entered By: Curtis Sites on 04/16/2019 13:29:32 [name] ([id number]) -------------------------------------------------------------------------------- Vitals Details Patient Name: Danielle Coco T. Date of Service: 04/16/2019 12:45 PM Medical Record Number: [id number] Patient Account Number: [id number] Date of Birth/Sex: [birth year] (85 y.o. F) Treating RN: Arnette Norris Primary Care Cassidi Modesitt: Barbette Reichmann Other Clinician: Referring Brit Carbonell: Barbette Reichmann Treating Debarah Mccumbers/Extender: Linwood Dibbles, HOYT Weeks in Treatment: 0 Vital Signs Time Taken: 13:02 Pulse (bpm): 60 Height (in): 59 Respiratory Rate (breaths/min): 20 Source: Stated Blood Pressure (mmHg): 130/72 Weight (lbs): 98 Reference Range: 80 - 120 mg / dl Source: Stated Body Mass Index (BMI): 19.8 Electronic Signature(s) Signed: 04/16/2019 4:17:13 PM By: Arnette Norris Entered By: Arnette Norris on 04/16/2019 13:05:22

## 2019-04-17 NOTE — Progress Notes (Signed)
[name], Rosemary T. ([id number]) Visit Report for 04/16/2019 Chief Complaint Document Details Patient Name: [name], Mckala T. Date of Service: 04/16/2019 12:45 PM Medical Record Number: [id number] Patient Account Number: [id number] Date of Birth/Sex: 09/17/1925 (85 y.o. F) Treating RN: Curtis Sitesorthy, Joanna Primary Care Provider: Barbette ReichmannHande, Vishwanath Other Clinician: Referring Provider: Barbette ReichmannHande, Vishwanath Treating Provider/Extender: Linwood DibblesSTONE III, HOYT Weeks in Treatment: 0 Information Obtained from: Patient Chief Complaint Left lateral ankle ulcer Electronic Signature(s) Signed: 04/17/2019 1:28:28 AM By: Lenda KelpStone III, Hoyt PA-C Entered By: Lenda KelpStone III, Hoyt on 04/16/2019 13:40:28 [name], Berdine T. ([id number]) -------------------------------------------------------------------------------- HPI Details Patient Name: [name], [name] Date of Service: 04/16/2019 12:45 PM Medical Record Number: [id number] Patient Account Number: [id number] Date of Birth/Sex: 04/30/1925 (85 y.o. F) Treating RN: Curtis Sitesorthy, Joanna Primary Care Provider: Barbette ReichmannHande, Vishwanath Other Clinician: Referring Provider: Barbette ReichmannHande, Vishwanath Treating Provider/Extender: Linwood DibblesSTONE III, HOYT Weeks in Treatment: 0 History of Present Illness HPI Description: 04/16/19 patient presents today for initial evaluation our clinic concerning an issue with her left lateral ankle which has been present for roughly 7 years. She intermittently has had issues with infection unfortunately. She's also been on IV antibiotics back in August/September 2019. She has not had a positive test for osteomyelitis up to this point in her last MRI which was February 2019 was negative. She has had private work on her lower extremities having had a fem-pop bypass of the lower extremity on the left. She subsequently has had angioplasty as well as stenting of this extremity. Nonetheless overall it appears that she's had quite a bit of extensive work. Currently this healed in January 2020  when she was being seen at [facility] wound care center. Subsequently it unfortunately reopened she tells me in March and the patient's daughter who was present during the evaluation concurs. The patient is a previous smoker she is on Plavix currently. She has no history of diabetes. We were unable to obtain an ABI due to what appears to be poor flowing to left lower extremity currently. No fevers, chills, nausea, or vomiting noted at this time. Electronic Signature(s) Signed: 04/17/2019 1:28:28 AM By: Lenda KelpStone III, Hoyt PA-C Entered By: Lenda KelpStone III, Hoyt on 04/16/2019 16:50:13 [name], Jhordan T. ([id number]) -------------------------------------------------------------------------------- Physical Exam Details Patient Name: [name], [name] Date of Service: 04/16/2019 12:45 PM Medical Record Number: [id number] Patient Account Number: [id number] Date of Birth/Sex: 01/25/1925 (85 y.o. F) Treating RN: Curtis Sitesorthy, Joanna Primary Care Provider: Barbette ReichmannHande, Vishwanath Other Clinician: Referring Provider: Barbette ReichmannHande, Vishwanath Treating Provider/Extender: Linwood DibblesSTONE III, HOYT Weeks in Treatment: 0 Constitutional sitting or standing blood pressure is within target range for patient.. pulse regular and within target range for patient.Marland Kitchen. respirations regular, non-labored and within target range for patient.Marland Kitchen. temperature within target range for patient.. Well- nourished and well-hydrated in no acute distress. Eyes conjunctiva clear no eyelid edema noted. pupils equal round and reactive to light and accommodation. Ears, Nose, Mouth, and Throat no gross abnormality of ear auricles or external auditory canals. normal hearing noted during conversation. mucus membranes moist. Respiratory normal breathing without difficulty on oxygen. clear to auscultation bilaterally. Cardiovascular regular rate and rhythm with normal S1, S2. Absent posterior tibial and dorsalis pedis pulses bilateral lower extremities. trace pitting edema of the  bilateral lower extremities. Gastrointestinal (GI) soft, non-tender, non-distended, +BS. no ventral hernia noted. Musculoskeletal Patient unable to walk without assistance. no significant deformity or arthritic changes, no loss or range of motion, no clubbing. Psychiatric Patient is not able to cooperate in decision making regarding care. Patient is oriented to person only. pleasant and  cooperative. Notes Upon evaluation today patient actually has some discomfort upon even light cleansing and palpation of the wound bed. Fortunately there's no signs of significant infection although there is some your thing and want noted at the extremity which does have me concerned since it's in the immediate area of the open ulceration for infection. Due to the purulent drainage that was noted I did actually attend a culture today. Apparently the patient has been on quite a bit of informatics in the past unfortunately they do not seem to be too effective according to her daughter. Nonetheless I do believe that she would be a candidate for attempting linezolid which I think could be better and more helpful for her. The patient is in agreement with that plan. Her daughter concurs. Electronic Signature(s) Signed: 04/17/2019 1:28:28 AM By: Lenda Kelp PA-C Entered By: Lenda Kelp on 04/16/2019 16:52:04 [name] ([id number]) -------------------------------------------------------------------------------- Physician Orders Details Patient Name: Danielle Coco T. Date of Service: 04/16/2019 12:45 PM Medical Record Number: [id number] Patient Account Number: [id number] Date of Birth/Sex: 08/13/1925 (85 y.o. F) Treating RN: Curtis Sites Primary Care Provider: Barbette Reichmann Other Clinician: Referring Provider: Barbette Reichmann Treating Provider/Extender: Linwood Dibbles, HOYT Weeks in Treatment: 0 Verbal / Phone Orders: No Diagnosis Coding ICD-10 Coding Code Description I73.89 Other specified  peripheral vascular diseases L97.322 Non-pressure chronic ulcer of left ankle with fat layer exposed I48.20 Chronic atrial fibrillation, unspecified I10 Essential (primary) hypertension Wound Cleansing Wound #1 Left,Lateral Malleolus o Clean wound with Normal Saline. Anesthetic (add to Medication List) Wound #1 Left,Lateral Malleolus o Topical Lidocaine 4% cream applied to wound bed prior to debridement (In Clinic Only). Primary Wound Dressing Wound #1 Left,Lateral Malleolus o Silver Alginate Secondary Dressing Wound #1 Left,Lateral Malleolus o Boardered Foam Dressing Dressing Change Frequency Wound #1 Left,Lateral Malleolus o Change Dressing Monday, Wednesday, Friday Follow-up Appointments Wound #1 Left,Lateral Malleolus o Return Appointment in 1 week. Home Health Wound #1 Left,Lateral Malleolus o Continue Home Health Visits - [facility] o Home Health Nurse may visit PRN to address patientos wound care needs. o FACE TO FACE ENCOUNTER: MEDICARE and MEDICAID PATIENTS: I certify that this patient is under my care and that I had a face-to-face encounter that meets the physician face-to-face encounter requirements with this patient on this date. The encounter with the patient was in whole or in part for the following MEDICAL CONDITION: (primary reason for Home Healthcare) MEDICAL NECESSITY: I certify, that based on my findings, NURSING services are a medically necessary home health service. HOME BOUND STATUS: I certify that my clinical findings support that this patient is homebound (i.e., Due to illness or injury, pt requires aid of TALEIA, NISHIHARA. ([id number]) supportive devices such as crutches, cane, wheelchairs, walkers, the use of special transportation or the assistance of another person to leave their place of residence. There is a normal inability to leave the home and doing so requires considerable and taxing effort. Other absences are for medical reasons /  religious services and are infrequent or of short duration when for other reasons). o If current dressing causes regression in wound condition, may D/C ordered dressing product/s and apply Normal Saline Moist Dressing daily until next Wound Healing Center / Other MD appointment. Notify Wound Healing Center of regression in wound condition at [phone]. o Please direct any NON-WOUND related issues/requests for orders to patient's Primary Care Physician Consults o Vascular - [facility] Vascular Referral Laboratory o Bacteria identified in Wound by Culture (MICRO) oooo LOINC Code: 6462-6 oooo  Convenience Name: Wound culture routine Patient Medications Allergies: minocycline, Sulfa (Sulfonamide Antibiotics), oxycodone, Fosamax, doxycycline Notifications Medication Indication Start End linezolid 04/16/2019 DOSE 1 - oral 600 mg tablet - 1 tablet oral taken 2 times a day for 14 days Electronic Signature(s) Signed: 04/16/2019 2:08:31 PM By: Lenda Kelp PA-C Entered By: Lenda Kelp on 04/16/2019 14:08:31 [name] ([id number]) -------------------------------------------------------------------------------- Problem List Details Patient Name: Danielle Coco T. Date of Service: 04/16/2019 12:45 PM Medical Record Number: [id number] Patient Account Number: [id number] Date of Birth/Sex: 1925-04-21 (85 y.o. F) Treating RN: Curtis Sites Primary Care Provider: Barbette Reichmann Other Clinician: Referring Provider: Barbette Reichmann Treating Provider/Extender: Linwood Dibbles, HOYT Weeks in Treatment: 0 Active Problems ICD-10 Evaluated Encounter Code Description Active Date Today Diagnosis I73.89 Other specified peripheral vascular diseases 04/16/2019 No Yes L97.322 Non-pressure chronic ulcer of left ankle with fat layer 04/16/2019 No Yes exposed I48.20 Chronic atrial fibrillation, unspecified 04/16/2019 No Yes I10 Essential (primary) hypertension 04/16/2019 No Yes Inactive  Problems Resolved Problems Electronic Signature(s) Signed: 04/17/2019 1:28:28 AM By: Lenda Kelp PA-C Entered By: Lenda Kelp on 04/16/2019 13:39:49 [name] ([id number]) -------------------------------------------------------------------------------- Progress Note Details Patient Name: Danielle Coco T. Date of Service: 04/16/2019 12:45 PM Medical Record Number: [id number] Patient Account Number: [id number] Date of Birth/Sex: 11/21/1925 (85 y.o. F) Treating RN: Curtis Sites Primary Care Provider: Barbette Reichmann Other Clinician: Referring Provider: Barbette Reichmann Treating Provider/Extender: Linwood Dibbles, HOYT Weeks in Treatment: 0 Subjective Chief Complaint Information obtained from Patient Left lateral ankle ulcer History of Present Illness (HPI) 04/16/19 patient presents today for initial evaluation our clinic concerning an issue with her left lateral ankle which has been present for roughly 7 years. She intermittently has had issues with infection unfortunately. She's also been on IV antibiotics back in August/September 2019. She has not had a positive test for osteomyelitis up to this point in her last MRI which was February 2019 was negative. She has had private work on her lower extremities having had a fem-pop bypass of the lower extremity on the left. She subsequently has had angioplasty as well as stenting of this extremity. Nonetheless overall it appears that she's had quite a bit of extensive work. Currently this healed in January 2020 when she was being seen at the [facility] wound care center. Subsequently it unfortunately reopened she tells me in March and the patient's daughter who was present during the evaluation concurs. The patient is a previous smoker she is on Plavix currently. She has no history of diabetes. We were unable to obtain an ABI due to what appears to be poor flowing to left lower extremity currently. No fevers, chills, nausea, or vomiting  noted at this time. Patient History Information obtained from Patient. Allergies oxycodone (Reaction: unknown), Fosamax (Reaction: unknown), minocycline (Severity: Mild, Reaction: nausea and vomiting), Sulfa (Sulfonamide Antibiotics) (Severity: Mild, Reaction: nausea and vomiting), doxycycline (Reaction: nausea and vomiting) Family History Heart Disease - Father, Hypertension - Father, No family history of Cancer, Diabetes, Hereditary Spherocytosis, Kidney Disease, Lung Disease, Seizures, Stroke, Thyroid Problems, Tuberculosis. Social History Former smoker - ended on 12/02/2018, Marital Status - Widowed, Alcohol Use - Never, Drug Use - No History. Medical History Eyes Denies history of Cataracts, Glaucoma, Optic Neuritis Ear/Nose/Mouth/Throat Denies history of Chronic sinus problems/congestion, Middle ear problems Hematologic/Lymphatic Denies history of Anemia, Hemophilia, Human Immunodeficiency Virus, Lymphedema, Sickle Cell Disease Respiratory Denies history of Aspiration, Asthma, Chronic Obstructive Pulmonary Disease (COPD), Pneumothorax, Sleep Apnea, Tuberculosis Cardiovascular Patient has history of Arrhythmia - A Fib,  Tachycardia, palpitations, Hypertension, Peripheral Venous Disease SHAKIYLA, KOOK ([id number]) Endocrine Denies history of Type I Diabetes, Type II Diabetes Genitourinary Denies history of End Stage Renal Disease Integumentary (Skin) Patient has history of History of pressure wounds - Current wound 2013 Denies history of History of Burn Musculoskeletal Denies history of Gout Oncologic Denies history of Received Chemotherapy, Received Radiation Hospitalization/Surgery History - Abdominal hysterectomy. - Angiogram 12/15/12,09/07/14. - Angiogram 05/2016. - Angioplasty / Stenting iliac. - Colonoscopy 2001. - Pulmonary angioplasty 05/15/2017. Medical And Surgical History Notes Musculoskeletal Degenerative joint disease, Degenerative arthritis. osteoporosis Review  of Systems (ROS) Constitutional Symptoms (General Health) Denies complaints or symptoms of Fatigue, Fever, Chills, Marked Weight Change. Eyes Complains or has symptoms of Glasses / Contacts. Ear/Nose/Mouth/Throat Denies complaints or symptoms of Difficult clearing ears, Sinusitis, HOH Hematologic/Lymphatic Denies complaints or symptoms of Bleeding / Clotting Disorders, Human Immunodeficiency Virus. Respiratory Complains or has symptoms of Shortness of Breath. Denies complaints or symptoms of Chronic or frequent coughs. Cardiovascular Denies complaints or symptoms of Chest pain, LE edema. Gastrointestinal Colon polyp, GERD, IBS Endocrine Hyperlipidemia Genitourinary Denies complaints or symptoms of Kidney failure/ Dialysis, Incontinence/dribbling. Immunological Denies complaints or symptoms of Hives, Itching. Integumentary (Skin) Complains or has symptoms of Wounds - Foot ulcer. Musculoskeletal Complains or has symptoms of Muscle Weakness. Denies complaints or symptoms of Muscle Pain. Neurologic Complains or has symptoms of Numbness/parasthesias - hands. Psychiatric Complains or has symptoms of Anxiety. SUHANI, STILLION T. ([id number]) Objective Constitutional sitting or standing blood pressure is within target range for patient.. pulse regular and within target range for patient.Marland Kitchen respirations regular, non-labored and within target range for patient.Marland Kitchen temperature within target range for patient.. Well- nourished and well-hydrated in no acute distress. Vitals Time Taken: 1:02 PM, Height: 59 in, Source: Stated, Weight: 98 lbs, Source: Stated, BMI: 19.8, Pulse: 60 bpm, Respiratory Rate: 20 breaths/min, Blood Pressure: 130/72 mmHg. Eyes conjunctiva clear no eyelid edema noted. pupils equal round and reactive to light and accommodation. Ears, Nose, Mouth, and Throat no gross abnormality of ear auricles or external auditory canals. normal hearing noted during conversation.  mucus membranes moist. Respiratory normal breathing without difficulty on oxygen. clear to auscultation bilaterally. Cardiovascular regular rate and rhythm with normal S1, S2. Absent posterior tibial and dorsalis pedis pulses bilateral lower extremities. trace pitting edema of the bilateral lower extremities. Gastrointestinal (GI) soft, non-tender, non-distended, +BS. no ventral hernia noted. Musculoskeletal Patient unable to walk without assistance. no significant deformity or arthritic changes, no loss or range of motion, no clubbing. Psychiatric Patient is not able to cooperate in decision making regarding care. Patient is oriented to person only. pleasant and cooperative. General Notes: Upon evaluation today patient actually has some discomfort upon even light cleansing and palpation of the wound bed. Fortunately there's no signs of significant infection although there is some your thing and want noted at the extremity which does have me concerned since it's in the immediate area of the open ulceration for infection. Due to the purulent drainage that was noted I did actually attend a culture today. Apparently the patient has been on quite a bit of informatics in the past unfortunately they do not seem to be too effective according to her daughter. Nonetheless I do believe that she would be a candidate for attempting linezolid which I think could be better and more helpful for her. The patient is in agreement with that plan. Her daughter concurs. Integumentary (Hair, Skin) Wound #1 status is Open. Original cause of wound was Gradually Appeared. The wound  is located on the Left,Lateral Malleolus. The wound measures 3.3cm length x 1.8cm width x 0.2cm depth; 4.665cm^2 area and 0.933cm^3 volume. There is Fat Layer (Subcutaneous Tissue) Exposed exposed. There is no tunneling or undermining noted. There is a medium amount of serous drainage noted. The wound margin is flat and intact. There is  small (1-33%) red granulation within the wound bed. There is a large (67-100%) amount of necrotic tissue within the wound bed including Adherent Slough. The periwound skin appearance exhibited: Hemosiderin Staining, Erythema. The periwound skin appearance did not exhibit: Callus, Crepitus, Excoriation, Induration, Rash, Scarring, Dry/Scaly, Maceration, Atrophie Blanche, Cyanosis, Ecchymosis, Mottled, Pallor, Rubor. The surrounding wound skin color is noted with erythema which is circumferential. Periwound temperature was noted as No Abnormality. The periwound has tenderness on palpation. PATICIA, MOSTER ([id number]) Assessment Active Problems ICD-10 Other specified peripheral vascular diseases Non-pressure chronic ulcer of left ankle with fat layer exposed Chronic atrial fibrillation, unspecified Essential (primary) hypertension Plan Wound Cleansing: Wound #1 Left,Lateral Malleolus: Clean wound with Normal Saline. Anesthetic (add to Medication List): Wound #1 Left,Lateral Malleolus: Topical Lidocaine 4% cream applied to wound bed prior to debridement (In Clinic Only). Primary Wound Dressing: Wound #1 Left,Lateral Malleolus: Silver Alginate Secondary Dressing: Wound #1 Left,Lateral Malleolus: Boardered Foam Dressing Dressing Change Frequency: Wound #1 Left,Lateral Malleolus: Change Dressing Monday, Wednesday, Friday Follow-up Appointments: Wound #1 Left,Lateral Malleolus: Return Appointment in 1 week. Home Health: Wound #1 Left,Lateral Malleolus: Continue Home Health Visits - [facility] Home Health Nurse may visit PRN to address patient s wound care needs. FACE TO FACE ENCOUNTER: MEDICARE and MEDICAID PATIENTS: I certify that this patient is under my care and that I had a face-to-face encounter that meets the physician face-to-face encounter requirements with this patient on this date. The encounter with the patient was in whole or in part for the following MEDICAL CONDITION:  (primary reason for Home Healthcare) MEDICAL NECESSITY: I certify, that based on my findings, NURSING services are a medically necessary home health service. HOME BOUND STATUS: I certify that my clinical findings support that this patient is homebound (i.e., Due to illness or injury, pt requires aid of supportive devices such as crutches, cane, wheelchairs, walkers, the use of special transportation or the assistance of another person to leave their place of residence. There is a normal inability to leave the home and doing so requires considerable and taxing effort. Other absences are for medical reasons / religious services and are infrequent or of short duration when for other reasons). If current dressing causes regression in wound condition, may D/C ordered dressing product/s and apply Normal Saline Moist Dressing daily until next Wound Healing Center / Other MD appointment. Notify Wound Healing Center of regression in wound condition at [phone]. Please direct any NON-WOUND related issues/requests for orders to patient's Primary Care Physician Laboratory ordered were: Wound culture routine Consults ordered were: Vascular [facility] Vascular Referral HALEA, LIEB. ([id number]) The following medication(s) was prescribed: linezolid oral 600 mg tablet 1 1 tablet oral taken 2 times a day for 14 days starting 04/16/2019 For today I did go ahead and obtain the wound culture and I am gonna place the patient on linezolid. Subsequently I am going to go ahead as well and make a referral back to vascular for them to reevaluate her arterial flow and assure she has sufficient blood flow to heal this wound and hopefully keep it healed. Subsequently I'm also suggesting a potential for a repeat MRI although I did not wear that  today that may be something we do in the future depending how things progress. I think that the arterial flow confirmation is first on the agenda in my pinion. Subsequently we  will see were things stand during reevaluation. The patient is a current patient at [facility] vascular therefore will gonna have them see her for evaluation and treatment if needed. Otherwise I will see her back for reevaluation in one weeks time to see were things stand. Please see above for specific wound care orders. We will see patient for re-evaluation in 1 week(s) here in the clinic. If anything worsens or changes patient will contact our office for additional recommendations. Electronic Signature(s) Signed: 04/17/2019 1:28:28 AM By: Lenda Kelp PA-C Entered By: Lenda Kelp on 04/16/2019 16:53:17 [name] ([id number]) -------------------------------------------------------------------------------- ROS/PFSH Details Patient Name: Danielle Coco T. Date of Service: 04/16/2019 12:45 PM Medical Record Number: [id number] Patient Account Number: [id number] Date of Birth/Sex: Sep 03, 1925 (85 y.o. F) Treating RN: Arnette Norris Primary Care Provider: Barbette Reichmann Other Clinician: Referring Provider: Barbette Reichmann Treating Provider/Extender: Linwood Dibbles, HOYT Weeks in Treatment: 0 Information Obtained From Patient Constitutional Symptoms (General Health) Complaints and Symptoms: Negative for: Fatigue; Fever; Chills; Marked Weight Change Eyes Complaints and Symptoms: Positive for: Glasses / Contacts Medical History: Negative for: Cataracts; Glaucoma; Optic Neuritis Ear/Nose/Mouth/Throat Complaints and Symptoms: Negative for: Difficult clearing ears; Sinusitis Review of System Notes: HOH Medical History: Negative for: Chronic sinus problems/congestion; Middle ear problems Hematologic/Lymphatic Complaints and Symptoms: Negative for: Bleeding / Clotting Disorders; Human Immunodeficiency Virus Medical History: Negative for: Anemia; Hemophilia; Human Immunodeficiency Virus; Lymphedema; Sickle Cell Disease Respiratory Complaints and Symptoms: Positive for: Shortness of  Breath Negative for: Chronic or frequent coughs Medical History: Negative for: Aspiration; Asthma; Chronic Obstructive Pulmonary Disease (COPD); Pneumothorax; Sleep Apnea; Tuberculosis Cardiovascular Complaints and Symptoms: Negative for: Chest pain; LE edema Medical HistoryROSALENA, MCCORRY ([id number]) Positive for: Arrhythmia - A Fib, Tachycardia, palpitations; Hypertension; Peripheral Venous Disease Genitourinary Complaints and Symptoms: Negative for: Kidney failure/ Dialysis; Incontinence/dribbling Medical History: Negative for: End Stage Renal Disease Immunological Complaints and Symptoms: Negative for: Hives; Itching Integumentary (Skin) Complaints and Symptoms: Positive for: Wounds - Foot ulcer Medical History: Positive for: History of pressure wounds - Current wound 2013 Negative for: History of Burn Musculoskeletal Complaints and Symptoms: Positive for: Muscle Weakness Negative for: Muscle Pain Medical History: Negative for: Gout Past Medical History Notes: Degenerative joint disease, Degenerative arthritis. osteoporosis Neurologic Complaints and Symptoms: Positive for: Numbness/parasthesias - hands Psychiatric Complaints and Symptoms: Positive for: Anxiety Gastrointestinal Complaints and Symptoms: Review of System Notes: Colon polyp, GERD, IBS Endocrine Complaints and Symptoms: Review of System Notes: Hyperlipidemia Medical History: Negative for: Type I Diabetes; Type II Diabetes GIANNAH, ZAVADIL ([id number]) Oncologic Medical History: Negative for: Received Chemotherapy; Received Radiation Immunizations Pneumococcal Vaccine: Received Pneumococcal Vaccination: Yes Implantable Devices No devices added Hospitalization / Surgery History Type of Hospitalization/Surgery Abdominal hysterectomy Angiogram 12/15/12,09/07/14 Angiogram 05/2016 Angioplasty / Stenting iliac Colonoscopy 2001 Pulmonary angioplasty 05/15/2017 Family and Social  History Cancer: No; Diabetes: No; Heart Disease: Yes - Father; Hereditary Spherocytosis: No; Hypertension: Yes - Father; Kidney Disease: No; Lung Disease: No; Seizures: No; Stroke: No; Thyroid Problems: No; Tuberculosis: No; Former smoker - ended on 12/02/2018; Marital Status - Widowed; Alcohol Use: Never; Drug Use: No History; Financial Concerns: No; Food, Clothing or Shelter Needs: No; Support System Lacking: No; Transportation Concerns: No Electronic Signature(s) Signed: 04/16/2019 4:17:13 PM By: Arnette Norris Signed: 04/17/2019 1:28:28 AM By: Lenda Kelp PA-C Entered By: Arnette Norris  on 04/16/2019 13:13:38 FANY, CAVANAUGH TMarland Kitchen ([id number]) -------------------------------------------------------------------------------- SuperBill Details Patient Name: ALUEL, SCHWARZ. Date of Service: 04/16/2019 Medical Record Number: [id number] Patient Account Number: [id number] Date of Birth/Sex: 1925-11-11 (85 y.o. F) Treating RN: Curtis Sites Primary Care Provider: Barbette Reichmann Other Clinician: Referring Provider: Barbette Reichmann Treating Provider/Extender: Linwood Dibbles, HOYT Weeks in Treatment: 0 Diagnosis Coding ICD-10 Codes Code Description I73.89 Other specified peripheral vascular diseases L97.322 Non-pressure chronic ulcer of left ankle with fat layer exposed I48.20 Chronic atrial fibrillation, unspecified I10 Essential (primary) hypertension Facility Procedures CPT4 Code: [id number] Description: 30865 - WOUND CARE VISIT-LEV 5 EST PT Modifier: Quantity: 1 Physician Procedures CPT4 Code: 7846962 Description: 99204 - WC PHYS LEVEL 4 - NEW PT ICD-10 Diagnosis Description I73.89 Other specified peripheral vascular diseases L97.322 Non-pressure chronic ulcer of left ankle with fat layer ex I48.20 Chronic atrial fibrillation, unspecified I10  Essential (primary) hypertension Modifier: posed Quantity: 1 Electronic Signature(s) Signed: 04/17/2019 1:28:28 AM By: Lenda Kelp  PA-C Previous Signature: 04/16/2019 4:26:01 PM Version By: Curtis Sites Entered By: Lenda Kelp on 04/16/2019 16:53:35

## 2019-04-19 LAB — AEROBIC CULTURE? (SUPERFICIAL SPECIMEN)

## 2019-04-19 LAB — AEROBIC CULTURE W GRAM STAIN (SUPERFICIAL SPECIMEN): Gram Stain: NONE SEEN

## 2019-04-30 ENCOUNTER — Encounter: Payer: Medicare Other | Admitting: Physician Assistant

## 2019-04-30 ENCOUNTER — Other Ambulatory Visit: Payer: Self-pay

## 2019-04-30 DIAGNOSIS — I739 Peripheral vascular disease, unspecified: Secondary | ICD-10-CM | POA: Diagnosis not present

## 2019-05-01 NOTE — Progress Notes (Signed)
SHIRLA, HODGKISS ([id number]) Visit Report for 04/30/2019 Chief Complaint Document Details Patient Name: [name], [name]. Date of Service: 04/30/2019 1:45 PM Medical Record Number: [id number] Patient Account Number: [id number] Date of Birth/Sex: 11/17/1925 (85 y.o. F) Treating RN: Curtis Sites Primary Care Provider: Barbette Reichmann Other Clinician: Referring Provider: Barbette Reichmann Treating Provider/Extender: Linwood Dibbles, Khaliya Golinski Weeks in Treatment: 2 Information Obtained from: Patient Chief Complaint Left lateral ankle ulcer Electronic Signature(s) Signed: 05/01/2019 12:12:44 AM By: Lenda Kelp PA-C Entered By: Lenda Kelp on 04/30/2019 14:20:25 [name] ([id number]) -------------------------------------------------------------------------------- HPI Details Patient Name: Danielle Coco T. Date of Service: 04/30/2019 1:45 PM Medical Record Number: [id number] Patient Account Number: [id number] Date of Birth/Sex: 1925/01/01 (85 y.o. F) Treating RN: Curtis Sites Primary Care Provider: Barbette Reichmann Other Clinician: Referring Provider: Barbette Reichmann Treating Provider/Extender: Linwood Dibbles, Kojo Liby Weeks in Treatment: 2 History of Present Illness HPI Description: 04/16/19 patient presents today for initial evaluation our clinic concerning an issue with her left lateral ankle which has been present for roughly 7 years. She intermittently has had issues with infection unfortunately. She's also been on IV antibiotics back in August/September 2019. She has not had a positive test for osteomyelitis up to this point in her last MRI which was February 2019 was negative. She has had private work on her lower extremities having had a fem-pop bypass of the lower extremity on the left. She subsequently has had angioplasty as well as stenting of this extremity. Nonetheless overall it appears that she's had quite a bit of extensive work. Currently this healed in January 2020  when she was being seen at the [facility] wound care center. Subsequently it unfortunately reopened she tells me in March and the patient's daughter who was present during the evaluation concurs. The patient is a previous smoker she is on Plavix currently. She has no history of diabetes. We were unable to obtain an ABI due to what appears to be poor flowing to left lower extremity currently. No fevers, chills, nausea, or vomiting noted at this time. 04/30/19 on evaluation today patient actually appears to be doing fairly well in regard to her ulcer in my pinion. In fact I feel like she's showing some signs of improvement at this time which is excellent news despite the fact that were still waiting the vascular appointment. That's in just one weeks time. She has the appointment vascular on June 3 which is next Wednesday. Overall I feel like that's there's no signs of active infection at this time which is good news. I did review her culture which showed positive for Pseudomonas but again I feel like this is likely secondary to just normal skin contamination especially considering the fact that her wound is doing so well currently. Therefore I'm not going to initiate any different therapy at this point with regard to the culture results if anything changes then I will keep this in mind. Electronic Signature(s) Signed: 05/01/2019 12:12:44 AM By: Lenda Kelp PA-C Entered By: Lenda Kelp on 04/30/2019 15:12:38 [name] ([id number]) -------------------------------------------------------------------------------- Physical Exam Details Patient Name: Danielle Coco T. Date of Service: 04/30/2019 1:45 PM Medical Record Number: [id number] Patient Account Number: [id number] Date of Birth/Sex: 06-Apr-1925 (85 y.o. F) Treating RN: Curtis Sites Primary Care Provider: Barbette Reichmann Other Clinician: Referring Provider: Barbette Reichmann Treating Provider/Extender: STONE III, Brinkley Peet Weeks in  Treatment: 2 Constitutional Well-nourished and well-hydrated in no acute distress. Respiratory normal breathing without difficulty. clear to auscultation bilaterally. Cardiovascular regular rate and rhythm with  normal S1, S2. Psychiatric this patient is able to make decisions and demonstrates good insight into disease process. Alert and Oriented x 3. pleasant and cooperative. Notes On inspection today patient's wound bed actually showed some signs of improvement as far as the overall appearance of the wound is concerned. With that being said she's definitely not healed but I do feel like she's made some progress. No sharp agreement was performed today. Electronic Signature(s) Signed: 05/01/2019 12:12:44 AM By: Lenda Kelp PA-C Entered By: Lenda Kelp on 04/30/2019 15:30:35 [name] ([id number]) -------------------------------------------------------------------------------- Physician Orders Details Patient Name: Danielle Coco T. Date of Service: 04/30/2019 1:45 PM Medical Record Number: [id number] Patient Account Number: [id number] Date of Birth/Sex: March 13, 1925 (85 y.o. F) Treating RN: Curtis Sites Primary Care Provider: Barbette Reichmann Other Clinician: Referring Provider: Barbette Reichmann Treating Provider/Extender: Linwood Dibbles, Pearson Picou Weeks in Treatment: 2 Verbal / Phone Orders: No Diagnosis Coding ICD-10 Coding Code Description I73.89 Other specified peripheral vascular diseases L97.322 Non-pressure chronic ulcer of left ankle with fat layer exposed I48.20 Chronic atrial fibrillation, unspecified I10 Essential (primary) hypertension Wound Cleansing Wound #1 Left,Lateral Malleolus o Clean wound with Normal Saline. Anesthetic (add to Medication List) Wound #1 Left,Lateral Malleolus o Topical Lidocaine 4% cream applied to wound bed prior to debridement (In Clinic Only). Primary Wound Dressing Wound #1 Left,Lateral Malleolus o Silver Alginate - PLEASE  ORDER SILVERCEL NON ADHERENT FOR PATIENT Secondary Dressing Wound #1 Left,Lateral Malleolus o Boardered Foam Dressing Dressing Change Frequency Wound #1 Left,Lateral Malleolus o Change Dressing Monday, Wednesday, Friday Follow-up Appointments Wound #1 Left,Lateral Malleolus o Return Appointment in 1 week. Home Health Wound #1 Left,Lateral Malleolus o Continue Home Health Visits - [facility] o Home Health Nurse may visit PRN to address patientos wound care needs. o FACE TO FACE ENCOUNTER: MEDICARE and MEDICAID PATIENTS: I certify that this patient is under my care and that I had a face-to-face encounter that meets the physician face-to-face encounter requirements with this patient on this date. The encounter with the patient was in whole or in part for the following MEDICAL CONDITION: (primary reason for Home Healthcare) MEDICAL NECESSITY: I certify, that based on my findings, NURSING services are a medically necessary home health service. HOME BOUND STATUS: I certify that my clinical findings support that this patient is homebound (i.e., Due to illness or injury, pt requires aid of RHETTA, CLEEK. ([id number]) supportive devices such as crutches, cane, wheelchairs, walkers, the use of special transportation or the assistance of another person to leave their place of residence. There is a normal inability to leave the home and doing so requires considerable and taxing effort. Other absences are for medical reasons / religious services and are infrequent or of short duration when for other reasons). o If current dressing causes regression in wound condition, may D/C ordered dressing product/s and apply Normal Saline Moist Dressing daily until next Wound Healing Center / Other MD appointment. Notify Wound Healing Center of regression in wound condition at [phone]. o Please direct any NON-WOUND related issues/requests for orders to patient's Primary Care  Physician Electronic Signature(s) Signed: 04/30/2019 4:36:42 PM By: Curtis Sites Signed: 05/01/2019 12:12:44 AM By: Lenda Kelp PA-C Entered By: Curtis Sites on 04/30/2019 15:01:09 [name] ([id number]) -------------------------------------------------------------------------------- Problem List Details Patient Name: Danielle Coco T. Date of Service: 04/30/2019 1:45 PM Medical Record Number: [id number] Patient Account Number: [id number] Date of Birth/Sex: June 21, 1925 (85 y.o. F) Treating RN: Curtis Sites Primary Care Provider: Barbette Reichmann Other Clinician: Referring Provider: Marcello Fennel,  Vishwanath Treating Provider/Extender: STONE III, Alejandrina Raimer Weeks in Treatment: 2 Active Problems ICD-10 Evaluated Encounter Code Description Active Date Today Diagnosis I73.89 Other specified peripheral vascular diseases 04/16/2019 No Yes L97.322 Non-pressure chronic ulcer of left ankle with fat layer 04/16/2019 No Yes exposed I48.20 Chronic atrial fibrillation, unspecified 04/16/2019 No Yes I10 Essential (primary) hypertension 04/16/2019 No Yes Inactive Problems Resolved Problems Electronic Signature(s) Signed: 05/01/2019 12:12:44 AM By: Lenda KelpStone III, Ayame Rena PA-C Entered By: Lenda KelpStone III, Talulah Schirmer on 04/30/2019 14:20:10 Danielle OppenheimGRAHAM, [name] ([id number]) -------------------------------------------------------------------------------- Progress Note Details Patient Name: [name], [name] Date of Service: 04/30/2019 1:45 PM Medical Record Number: [id number] Patient Account Number: [id number] Date of Birth/Sex: 07/04/1925 (85 y.o. F) Treating RN: Curtis Sitesorthy, Joanna Primary Care Provider: Barbette ReichmannHande, Vishwanath Other Clinician: Referring Provider: Barbette ReichmannHande, Vishwanath Treating Provider/Extender: Linwood DibblesSTONE III, Laramie Meissner Weeks in Treatment: 2 Subjective Chief Complaint Information obtained from Patient Left lateral ankle ulcer History of Present Illness (HPI) 04/16/19 patient presents today for initial evaluation our  clinic concerning an issue with her left lateral ankle which has been present for roughly 7 years. She intermittently has had issues with infection unfortunately. She's also been on IV antibiotics back in August/September 2019. She has not had a positive test for osteomyelitis up to this point in her last MRI which was February 2019 was negative. She has had private work on her lower extremities having had a fem-pop bypass of the lower extremity on the left. She subsequently has had angioplasty as well as stenting of this extremity. Nonetheless overall it appears that she's had quite a bit of extensive work. Currently this healed in January 2020 when [facility]. Subsequently it unfortunately reopened she tells me in March and the patient's daughter who was present during the evaluation concurs. The patient is a previous smoker she is on Plavix currently. She has no history of diabetes. We were unable to obtain an ABI due to what appears to be poor flowing to left lower extremity currently. No fevers, chills, nausea, or vomiting noted at this time. 04/30/19 on evaluation today patient actually appears to be doing fairly well in regard to her ulcer in my pinion. In fact I feel like she's showing some signs of improvement at this time which is excellent news despite the fact that were still waiting the vascular appointment. That's in just one weeks time. She has the appointment vascular on June 3 which is next Wednesday. Overall I feel like that's there's no signs of active infection at this time which is good news. I did review her culture which showed positive for Pseudomonas but again I feel like this is likely secondary to just normal skin contamination especially considering the fact that her wound is doing so well currently. Therefore I'm not going to initiate any different therapy at this point with regard to the culture results if anything changes then I will  keep this in mind. Patient History Information obtained from Patient. Family History Heart Disease - Father, Hypertension - Father, No family history of Cancer, Diabetes, Hereditary Spherocytosis, Kidney Disease, Lung Disease, Seizures, Stroke, Thyroid Problems, Tuberculosis. Social History Former smoker - ended on 12/02/2018, Marital Status - Widowed, Alcohol Use - Never, Drug Use - No History. Medical History Eyes Denies history of Cataracts, Glaucoma, Optic Neuritis Ear/Nose/Mouth/Throat Denies history of Chronic sinus problems/congestion, Middle ear problems Hematologic/Lymphatic Denies history of Anemia, Hemophilia, Human Immunodeficiency Virus, Lymphedema, Sickle Cell Disease Respiratory Denies history of Aspiration, Asthma, Chronic Obstructive Pulmonary Disease (COPD), Pneumothorax,  Sleep Apnea, Tuberculosis [name], [name] ([id number]) Cardiovascular Patient has history of Arrhythmia - A Fib, Tachycardia, palpitations, Hypertension, Peripheral Venous Disease Endocrine Denies history of Type I Diabetes, Type II Diabetes Genitourinary Denies history of End Stage Renal Disease Integumentary (Skin) Patient has history of History of pressure wounds - Current wound 2013 Denies history of History of Burn Musculoskeletal Denies history of Gout Oncologic Denies history of Received Chemotherapy, Received Radiation Hospitalization/Surgery History - Abdominal hysterectomy. - Angiogram 12/15/12,09/07/14. - Angiogram 05/2016. - Angioplasty / Stenting iliac. - Colonoscopy 2001. - Pulmonary angioplasty 05/15/2017. Medical And Surgical History Notes Musculoskeletal Degenerative joint disease, Degenerative arthritis. osteoporosis Review of Systems (ROS) Constitutional Symptoms (General Health) Denies complaints or symptoms of Fatigue, Fever, Chills, Marked Weight Change. Respiratory Denies complaints or symptoms of Chronic or frequent coughs, Shortness of Breath. Cardiovascular Denies  complaints or symptoms of Chest pain, LE edema. Psychiatric Denies complaints or symptoms of Anxiety, Claustrophobia. Objective Constitutional Well-nourished and well-hydrated in no acute distress. Vitals Time Taken: 2:24 PM, Height: 59 in, Weight: 98 lbs, BMI: 19.8, Temperature: 97.6 F, Pulse: 96 bpm, Respiratory Rate: 18 breaths/min, Blood Pressure: 136/77 mmHg. Respiratory normal breathing without difficulty. clear to auscultation bilaterally. Cardiovascular regular rate and rhythm with normal S1, S2. Psychiatric this patient is able to make decisions and demonstrates good insight into disease process. Alert and Oriented x 3. pleasant and cooperative. [name], Danielle TMarland Kitchen ([id number]) General Notes: On inspection today patient's wound bed actually showed some signs of improvement as far as the overall appearance of the wound is concerned. With that being said she's definitely not healed but I do feel like she's made some progress. No sharp agreement was performed today. Integumentary (Hair, Skin) Wound #1 status is Open. Original cause of wound was Gradually Appeared. The wound is located on the Left,Lateral Malleolus. The wound measures 3.1cm length x 2cm width x 0.2cm depth; 4.869cm^2 area and 0.974cm^3 volume. There is Fat Layer (Subcutaneous Tissue) Exposed exposed. There is no tunneling or undermining noted. There is a medium amount of serous drainage noted. The wound margin is flat and intact. There is medium (34-66%) red, hyper - granulation within the wound bed. There is a medium (34-66%) amount of necrotic tissue within the wound bed including Adherent Slough. Assessment Active Problems ICD-10 Other specified peripheral vascular diseases Non-pressure chronic ulcer of left ankle with fat layer exposed Chronic atrial fibrillation, unspecified Essential (primary) hypertension Plan Wound Cleansing: Wound #1 Left,Lateral Malleolus: Clean wound with Normal Saline. Anesthetic  (add to Medication List): Wound #1 Left,Lateral Malleolus: Topical Lidocaine 4% cream applied to wound bed prior to debridement (In Clinic Only). Primary Wound Dressing: Wound #1 Left,Lateral Malleolus: Silver Alginate - PLEASE ORDER SILVERCEL NON ADHERENT FOR PATIENT Secondary Dressing: Wound #1 Left,Lateral Malleolus: Boardered Foam Dressing Dressing Change Frequency: Wound #1 Left,Lateral Malleolus: Change Dressing Monday, Wednesday, Friday Follow-up Appointments: Wound #1 Left,Lateral Malleolus: Return Appointment in 1 week. Home Health: Wound #1 Left,Lateral Malleolus: Continue Home Health Visits - [facility] Nurse may visit PRN to address patient s wound care needs. FACE TO FACE ENCOUNTER: MEDICARE and MEDICAID PATIENTS: I certify that this patient is under my care and that I had a face-to-face encounter that meets the physician face-to-face encounter requirements with this patient on this date. The encounter with the patient was in whole or in part for the following MEDICAL CONDITION: (primary reason for Home Healthcare) MEDICAL NECESSITY: I certify, that based on my findings, NURSING services are a medically necessary home health service. HOME BOUND  STATUS: I certify that my clinical findings support that this patient is homebound (i.e., Due to [name], [name]. ([id number]) illness or injury, pt requires aid of supportive devices such as crutches, cane, wheelchairs, walkers, the use of special transportation or the assistance of another person to leave their place of residence. There is a normal inability to leave the home and doing so requires considerable and taxing effort. Other absences are for medical reasons / religious services and are infrequent or of short duration when for other reasons). If current dressing causes regression in wound condition, may D/C ordered dressing product/s and apply Normal Saline Moist Dressing daily until next Wound Healing Center /  Other MD appointment. Notify Wound Healing Center of regression in wound condition at [phone]. Please direct any NON-WOUND related issues/requests for orders to patient's Primary Care Physician My suggestion currently is gonna be that we continue with the above wound care measures for the next week you will be having her vascular appointment with you and see you on June 3 and will see what they have to say hopefully ensuring that her blood flow is good and we can proceed with what we need to do going forward. If anything changes or worsens meantime patient's daughter will contact the office and let us know. Please see above for specific wound care orders. We will see patient for re-evaluation in 1 week(s) here in the clinic. If anything worsens or changes patient will contact our office for additional recommendations. Electronic Signature(s) Signed: 05/01/2019 12:12:44 AM By: Lenda Kelp PA-C Entered By: Lenda Kelp on 04/30/2019 15:31:05 [name] ([id number]) -------------------------------------------------------------------------------- ROS/PFSH Details Patient Name: Danielle Coco T. Date of Service: 04/30/2019 1:45 PM Medical Record Number: [id number] Patient Account Number: [id number] Date of Birth/Sex: 02-09-1925 (85 y.o. F) Treating RN: Curtis Sites Primary Care Provider: Barbette Reichmann Other Clinician: Referring Provider: Barbette Reichmann Treating Provider/Extender: Linwood Dibbles, [place] Ament Weeks in Treatment: 2 Information Obtained From Patient Constitutional Symptoms (General Health) Complaints and Symptoms: Negative for: Fatigue; Fever; Chills; Marked Weight Change Respiratory Complaints and Symptoms: Negative for: Chronic or frequent coughs; Shortness of Breath Medical History: Negative for: Aspiration; Asthma; Chronic Obstructive Pulmonary Disease (COPD); Pneumothorax; Sleep Apnea; Tuberculosis Cardiovascular Complaints and Symptoms: Negative for:  Chest pain; LE edema Medical History: Positive for: Arrhythmia - A Fib, Tachycardia, palpitations; Hypertension; Peripheral Venous Disease Psychiatric Complaints and Symptoms: Negative for: Anxiety; Claustrophobia Eyes Medical History: Negative for: Cataracts; Glaucoma; Optic Neuritis Ear/Nose/Mouth/Throat Medical History: Negative for: Chronic sinus problems/congestion; Middle ear problems Hematologic/Lymphatic Medical History: Negative for: Anemia; Hemophilia; Human Immunodeficiency Virus; Lymphedema; Sickle Cell Disease Endocrine Medical History: Negative for: Type I Diabetes; Type II Diabetes [name], [name] ([id number]) Genitourinary Medical History: Negative for: End Stage Renal Disease Integumentary (Skin) Medical History: Positive for: History of pressure wounds - Current wound 2013 Negative for: History of Burn Musculoskeletal Medical History: Negative for: Gout Past Medical History Notes: Degenerative joint disease, Degenerative arthritis. osteoporosis Oncologic Medical History: Negative for: Received Chemotherapy; Received Radiation Immunizations Pneumococcal Vaccine: Received Pneumococcal Vaccination: Yes Implantable Devices No devices added Hospitalization / Surgery History Type of Hospitalization/Surgery Abdominal hysterectomy Angiogram 12/15/12,09/07/14 Angiogram 05/2016 Angioplasty / Stenting iliac Colonoscopy 2001 Pulmonary angioplasty 05/15/2017 Family and Social History Cancer: No; Diabetes: No; Heart Disease: Yes - Father; Hereditary Spherocytosis: No; Hypertension: Yes - Father; Kidney Disease: No; Lung Disease: No; Seizures: No; Stroke: No; Thyroid Problems: No; Tuberculosis: No; Former smoker - ended on 12/02/2018; Marital Status - Widowed; Alcohol Use: Never; Drug Use: No History; Financial  Concerns: No; Food, Clothing or Shelter Needs: No; Support System Lacking: No; Transportation Concerns: No Physician Affirmation I have reviewed and agree  with the above information. Electronic Signature(s) Signed: 04/30/2019 4:36:42 PM By: Curtis Sites Signed: 05/01/2019 12:12:44 AM By: Lenda Kelp PA-C Entered By: Lenda Kelp on 04/30/2019 15:12:55 [name] ([id number]) -------------------------------------------------------------------------------- SuperBill Details Patient Name: Danielle Coco T. Date of Service: 04/30/2019 Medical Record Number: [id number] Patient Account Number: [id number] Date of Birth/Sex: 1925/11/09 (85 y.o. F) Treating RN: Curtis Sites Primary Care Provider: Barbette Reichmann Other Clinician: Referring Provider: Barbette Reichmann Treating Provider/Extender: Linwood Dibbles, Amarylis Rovito Weeks in Treatment: 2 Diagnosis Coding ICD-10 Codes Code Description I73.89 Other specified peripheral vascular diseases L97.322 Non-pressure chronic ulcer of left ankle with fat layer exposed I48.20 Chronic atrial fibrillation, unspecified I10 Essential (primary) hypertension Facility Procedures CPT4 Code: [id number] Description: 99213 - WOUND CARE VISIT-LEV 3 EST PT Modifier: Quantity: 1 Physician Procedures CPT4 Code: [id number] Description: 99214 - WC PHYS LEVEL 4 - EST PT ICD-10 Diagnosis Description I73.89 Other specified peripheral vascular diseases L97.322 Non-pressure chronic ulcer of left ankle with fat layer ex I48.20 Chronic atrial fibrillation, unspecified I10  Essential (primary) hypertension Modifier: posed Quantity: 1 Electronic Signature(s) Signed: 05/01/2019 12:12:44 AM By: Lenda Kelp PA-C Entered By: Lenda Kelp on 04/30/2019 15:31:16

## 2019-05-05 NOTE — Progress Notes (Signed)
[name], Takiya T. ([id number]) Visit Report for 04/30/2019 Arrival Information Details Patient Name: [name], [name] Date of Service: 04/30/2019 1:45 PM Medical Record Number: [id number] Patient Account Number: [id number] Date of Birth/Sex: 10/08/1925 (85 y.o. F) Treating RN: Huel CoventryWoody, Kim Primary Care Halei Hanover: Barbette ReichmannHande, Vishwanath Other Clinician: Referring Merlie Noga: Barbette ReichmannHande, Vishwanath Treating Keyonta Madrid/Extender: Linwood DibblesSTONE III, HOYT Weeks in Treatment: 2 Visit Information History Since Last Visit Added or deleted any medications: No Patient Arrived: Walker Any new allergies or adverse reactions: No Arrival Time: 14:20 Had a fall or experienced change in No Accompanied By: daughter activities of daily living that may affect Transfer Assistance: Manual risk of falls: Patient Identification Verified: Yes Signs or symptoms of abuse/neglect since last visito No Secondary Verification Process Completed: Yes Hospitalized since last visit: No Patient Requires Transmission-Based Precautions: No Implantable device outside of the clinic excluding No cellular tissue based products placed in the center since last visit: Has Dressing in Place as Prescribed: Yes Pain Present Now: No Electronic Signature(s) Signed: 05/04/2019 5:54:26 PM By: Elliot GurneyWoody, BSN, RN, CWS, Kim RN, BSN Entered By: Elliot GurneyWoody, BSN, RN, CWS, Kim on 04/30/2019 14:24:06 [name], [name] ([id number]) -------------------------------------------------------------------------------- Clinic Level of Care Assessment Details Patient Name: [name], [name] Date of Service: 04/30/2019 1:45 PM Medical Record Number: [id number] Patient Account Number: [id number] Date of Birth/Sex: 06/10/1925 (85 y.o. F) Treating RN: Curtis Sitesorthy, Joanna Primary Care Iveliz Garay: Barbette ReichmannHande, Vishwanath Other Clinician: Referring Annarae Macnair: Barbette ReichmannHande, Vishwanath Treating Vinette Crites/Extender: Linwood DibblesSTONE III, HOYT Weeks in Treatment: 2 Clinic Level of Care Assessment Items TOOL 4 Quantity  Score []  - Use when only an EandM is performed on FOLLOW-UP visit 0 ASSESSMENTS - Nursing Assessment / Reassessment X - Reassessment of Co-morbidities (includes updates in patient status) 1 10 X- 1 5 Reassessment of Adherence to Treatment Plan ASSESSMENTS - Wound and Skin Assessment / Reassessment X - Simple Wound Assessment / Reassessment - one wound 1 5 []  - 0 Complex Wound Assessment / Reassessment - multiple wounds []  - 0 Dermatologic / Skin Assessment (not related to wound area) ASSESSMENTS - Focused Assessment []  - Circumferential Edema Measurements - multi extremities 0 []  - 0 Nutritional Assessment / Counseling / Intervention X- 1 5 Lower Extremity Assessment (monofilament, tuning fork, pulses) []  - 0 Peripheral Arterial Disease Assessment (using hand held doppler) ASSESSMENTS - Ostomy and/or Continence Assessment and Care []  - Incontinence Assessment and Management 0 []  - 0 Ostomy Care Assessment and Management (repouching, etc.) PROCESS - Coordination of Care X - Simple Patient / Family Education for ongoing care 1 15 []  - 0 Complex (extensive) Patient / Family Education for ongoing care X- 1 10 Staff obtains ChiropractorConsents, Records, Test Results / Process Orders []  - 0 Staff telephones HHA, Nursing Homes / Clarify orders / etc []  - 0 Routine Transfer to another Facility (non-emergent condition) []  - 0 Routine Hospital Admission (non-emergent condition) []  - 0 New Admissions / Manufacturing engineernsurance Authorizations / Ordering NPWT, Apligraf, etc. []  - 0 Emergency Hospital Admission (emergent condition) X- 1 10 Simple Discharge Coordination [name], [name] ([id number]) []  - 0 Complex (extensive) Discharge Coordination PROCESS - Special Needs []  - Pediatric / Minor Patient Management 0 []  - 0 Isolation Patient Management []  - 0 Hearing / Language / Visual special needs []  - 0 Assessment of Community assistance (transportation, D/C planning, etc.) []  - 0 Additional  assistance / Altered mentation []  - 0 Support Surface(s) Assessment (bed, cushion, seat, etc.) INTERVENTIONS - Wound Cleansing / Measurement X - Simple Wound Cleansing - one wound 1 5 []  -  0 Complex Wound Cleansing - multiple wounds X- 1 5 Wound Imaging (photographs - any number of wounds) []  - 0 Wound Tracing (instead of photographs) X- 1 5 Simple Wound Measurement - one wound []  - 0 Complex Wound Measurement - multiple wounds INTERVENTIONS - Wound Dressings X - Small Wound Dressing one or multiple wounds 1 10 []  - 0 Medium Wound Dressing one or multiple wounds []  - 0 Large Wound Dressing one or multiple wounds []  - 0 Application of Medications - topical []  - 0 Application of Medications - injection INTERVENTIONS - Miscellaneous []  - External ear exam 0 []  - 0 Specimen Collection (cultures, biopsies, blood, body fluids, etc.) []  - 0 Specimen(s) / Culture(s) sent or taken to Lab for analysis []  - 0 Patient Transfer (multiple staff / Nurse, adult / Similar devices) []  - 0 Simple Staple / Suture removal (25 or less) []  - 0 Complex Staple / Suture removal (26 or more) []  - 0 Hypo / Hyperglycemic Management (close monitor of Blood Glucose) []  - 0 Ankle / Brachial Index (ABI) - do not check if billed separately X- 1 5 Vital Signs [name], [name] ([id number]) Has the patient been seen at the hospital within the last three years: Yes Total Score: 90 Level Of Care: New/Established - Level 3 Electronic Signature(s) Signed: 04/30/2019 4:36:42 PM By: Curtis Sites Entered By: Curtis Sites on 04/30/2019 15:00:31 [name] ([id number]) -------------------------------------------------------------------------------- Encounter Discharge Information Details Patient Name: Danielle Coco T. Date of Service: 04/30/2019 1:45 PM Medical Record Number: [id number] Patient Account Number: [id number] Date of Birth/Sex: Mar 28, 1925 (85 y.o. F) Treating RN: Curtis Sites Primary Care Bernardina Cacho: Barbette Reichmann Other Clinician: Referring Remi Lopata: Barbette Reichmann Treating Anatalia Kronk/Extender: Linwood Dibbles, HOYT Weeks in Treatment: 2 Encounter Discharge Information Items Discharge Condition: Stable Ambulatory Status: Walker Discharge Destination: Home Transportation: Private Auto Accompanied By: daughter Schedule Follow-up Appointment: Yes Clinical Summary of Care: Electronic Signature(s) Signed: 04/30/2019 4:36:42 PM By: Curtis Sites Entered By: Curtis Sites on 04/30/2019 15:02:12 [name] ([id number]) -------------------------------------------------------------------------------- Lower Extremity Assessment Details Patient Name: Danielle Coco T. Date of Service: 04/30/2019 1:45 PM Medical Record Number: [id number] Patient Account Number: [id number] Date of Birth/Sex: May 17, 1925 (85 y.o. F) Treating RN: Huel Coventry Primary Care Yossef Gilkison: Barbette Reichmann Other Clinician: Referring Saidee Geremia: Barbette Reichmann Treating Suzan Manon/Extender: Linwood Dibbles, HOYT Weeks in Treatment: 2 Edema Assessment Assessed: [Left: No] [Right: No] [Left: Edema] [Right: :] Calf Left: Right: Point of Measurement: 29 cm From Medial Instep 27.8 cm cm Ankle Left: Right: Point of Measurement: 12 cm From Medial Instep 18 cm cm Vascular Assessment Pulses: Dorsalis Pedis Palpable: [Left:Yes] Electronic Signature(s) Signed: 05/04/2019 5:54:26 PM By: Elliot Gurney, BSN, RN, CWS, Kim RN, BSN Entered By: Elliot Gurney, BSN, RN, CWS, Kim on 04/30/2019 14:33:30 [name] ([id number]) -------------------------------------------------------------------------------- Multi Wound Chart Details Patient Name: Danielle Coco T. Date of Service: 04/30/2019 1:45 PM Medical Record Number: [id number] Patient Account Number: [id number] Date of Birth/Sex: 1925/09/09 (85 y.o. F) Treating RN: Curtis Sites Primary Care Jora Galluzzo: Barbette Reichmann Other Clinician: Referring Stephani Janak:  Barbette Reichmann Treating Jaisha Villacres/Extender: Linwood Dibbles, HOYT Weeks in Treatment: 2 Vital Signs Height(in): 59 Pulse(bpm): 96 Weight(lbs): 98 Blood Pressure(mmHg): 136/77 Body Mass Index(BMI): 20 Temperature(F): 97.6 Respiratory Rate 18 (breaths/min): Photos: [N/A:N/A] Wound Location: Left Malleolus - Lateral N/A N/A Wounding Event: Gradually Appeared N/A N/A Primary Etiology: Arterial Insufficiency Ulcer N/A N/A Comorbid History: Arrhythmia, Hypertension, N/A N/A Peripheral Venous Disease, History of pressure wounds Date Acquired: 01/31/2019 N/A N/A Weeks of Treatment: 2 N/A N/A Wound  Status: Open N/A N/A Clustered Wound: Yes N/A N/A Clustered Quantity: 4 N/A N/A Measurements L x W x D 3.1x2x0.2 N/A N/A (cm) Area (cm) : 4.869 N/A N/A Volume (cm) : 0.974 N/A N/A % Reduction in Area: -4.40% N/A N/A % Reduction in Volume: -4.40% N/A N/A Classification: Full Thickness Without N/A N/A Exposed Support Structures Exudate Amount: Medium N/A N/A Exudate Type: Serous N/A N/A Exudate Color: amber N/A N/A Wound Margin: Flat and Intact N/A N/A Granulation Amount: Medium (34-66%) N/A N/A Granulation Quality: Red, Hyper-granulation N/A N/A Necrotic Amount: Medium (34-66%) N/A N/A Exposed Structures: Fat Layer (Subcutaneous N/A N/A Tissue) Exposed: Yes Fascia: No [name], [name] ([id number]) Tendon: No Muscle: No Joint: No Bone: No Epithelialization: None N/A N/A Treatment Notes Electronic Signature(s) Signed: 04/30/2019 4:36:42 PM By: Curtis Sites Entered By: Curtis Sites on 04/30/2019 14:58:41 [name] ([id number]) -------------------------------------------------------------------------------- Multi-Disciplinary Care Plan Details Patient Name: Danielle Coco T. Date of Service: 04/30/2019 1:45 PM Medical Record Number: [id number] Patient Account Number: [id number] Date of Birth/Sex: 21-Mar-1925 (85 y.o. F) Treating RN: Curtis Sites Primary Care  Ammara Raj: Barbette Reichmann Other Clinician: Referring Baeleigh Devincent: Barbette Reichmann Treating Courteney Alderete/Extender: Linwood Dibbles, HOYT Weeks in Treatment: 2 Active Inactive Abuse / Safety / Falls / Self Care Management Nursing Diagnoses: Potential for falls Goals: Patient will not experience any injury related to falls Date Initiated: 04/16/2019 Target Resolution Date: 07/10/2019 Goal Status: Active Interventions: Assess fall risk on admission and as needed Notes: Nutrition Nursing Diagnoses: Potential for alteratiion in Nutrition/Potential for imbalanced nutrition Goals: Patient/caregiver agrees to and verbalizes understanding of need to use nutritional supplements and/or vitamins as prescribed Date Initiated: 04/16/2019 Target Resolution Date: 07/10/2019 Goal Status: Active Interventions: Assess patient nutrition upon admission and as needed per policy Notes: Orientation to the Wound Care Program Nursing Diagnoses: Knowledge deficit related to the wound healing center program Goals: Patient/caregiver will verbalize understanding of the Wound Healing Center Program Date Initiated: 04/16/2019 Target Resolution Date: 07/10/2019 Goal Status: Active Interventions: Provide education on orientation to the wound center [place], Jule Ser T. ([id number]) Notes: Wound/Skin Impairment Nursing Diagnoses: Impaired tissue integrity Goals: Ulcer/skin breakdown will heal within 14 weeks Date Initiated: 04/16/2019 Target Resolution Date: 07/10/2019 Goal Status: Active Interventions: Assess patient/caregiver ability to obtain necessary supplies Assess patient/caregiver ability to perform ulcer/skin care regimen upon admission and as needed Assess ulceration(s) every visit Notes: Electronic Signature(s) Signed: 04/30/2019 4:36:42 PM By: Curtis Sites Entered By: Curtis Sites on 04/30/2019 14:58:34 [name]  ([id number]) -------------------------------------------------------------------------------- Pain Assessment Details Patient Name: Danielle Coco T. Date of Service: 04/30/2019 1:45 PM Medical Record Number: [id number] Patient Account Number: [id number] Date of Birth/Sex: 08-Dec-1924 (85 y.o. F) Treating RN: Huel Coventry Primary Care Geneieve Duell: Barbette Reichmann Other Clinician: Referring Traylen Eckels: Barbette Reichmann Treating Jafet Wissing/Extender: Linwood Dibbles, HOYT Weeks in Treatment: 2 Active Problems Location of Pain Severity and Description of Pain Patient Has Paino No Site Locations Pain Management and Medication Current Pain Management: Electronic Signature(s) Signed: 05/04/2019 5:54:26 PM By: Elliot Gurney, BSN, RN, CWS, Kim RN, BSN Entered By: Elliot Gurney, BSN, RN, CWS, Kim on 04/30/2019 14:24:18 [name] ([id number]) -------------------------------------------------------------------------------- Patient/Caregiver Education Details Patient Name: Danielle Coco T. Date of Service: 04/30/2019 1:45 PM Medical Record Number: [id number] Patient Account Number: [id number] Date of Birth/Gender: 05-19-1925 (85 y.o. F) Treating RN: Curtis Sites Primary Care Physician: Barbette Reichmann Other Clinician: Referring Physician: Barbette Reichmann Treating Physician/Extender: Skeet Simmer in Treatment: 2 Education Assessment Education Provided To: Caregiver Education Topics Provided Wound/Skin Impairment: Handouts: Other: wound care as  ordered Methods: Demonstration, Explain/Verbal Responses: State content correctly Electronic Signature(s) Signed: 04/30/2019 4:36:42 PM By: Curtis Sites Entered By: Curtis Sites on 04/30/2019 15:00:10 [name] ([id number]) -------------------------------------------------------------------------------- Wound Assessment Details Patient Name: Danielle Coco T. Date of Service: 04/30/2019 1:45 PM Medical Record Number: [id number] Patient  Account Number: [id number] Date of Birth/Sex: [birth year] (85 y.o. F) Treating RN: Huel Coventry Primary Care Federico Maiorino: Barbette Reichmann Other Clinician: Referring Krisi Azua: Barbette Reichmann Treating Devany Aja/Extender: Linwood Dibbles, HOYT Weeks in Treatment: 2 Wound Status Wound Number: 1 Primary Arterial Insufficiency Ulcer Etiology: Wound Location: Left Malleolus - Lateral Wound Open Wounding Event: Gradually Appeared Status: Date Acquired: 01/31/2019 Comorbid Arrhythmia, Hypertension, Peripheral Venous Weeks Of Treatment: 2 History: Disease, History of pressure wounds Clustered Wound: Yes Photos Wound Measurements Length: (cm) 3.1 % Reduct Width: (cm) 2 % Reduct Depth: (cm) 0.2 Epitheli Clustered Quantity: 4 Tunnelin Area: (cm) 4.869 Undermi Volume: (cm) 0.974 ion in Area: -4.4% ion in Volume: -4.4% alization: None g: No ning: No Wound Description Full Thickness Without Exposed Support Foul Odo Classification: Structures Slough/F Wound Margin: Flat and Intact Exudate Medium Amount: Exudate Type: Serous Exudate Color: amber r After Cleansing: No ibrino Yes Wound Bed Granulation Amount: Medium (34-66%) Exposed Structure Granulation Quality: Red, Hyper-granulation Fascia Exposed: No Necrotic Amount: Medium (34-66%) Fat Layer (Subcutaneous Tissue) Exposed: Yes Necrotic Quality: Adherent Slough Tendon Exposed: No Muscle Exposed: No Joint Exposed: No Bone Exposed: No [name], [name] ([id number]) Treatment Notes Wound #1 (Left, Lateral Malleolus) Notes silvercel, bordered foam dressing Electronic Signature(s) Signed: 05/04/2019 5:54:26 PM By: Elliot Gurney, BSN, RN, CWS, Kim RN, BSN Entered By: Elliot Gurney, BSN, RN, CWS, Kim on 04/30/2019 14:32:03 [name] ([id number]) -------------------------------------------------------------------------------- Vitals Details Patient Name: Danielle Coco T. Date of Service: 04/30/2019 1:45 PM Medical Record Number:  [id number] Patient Account Number: [id number] Date of Birth/Sex: [birth year] (85 y.o. F) Treating RN: Huel Coventry Primary Care Valene Villa: Barbette Reichmann Other Clinician: Referring Kylyn Sookram: Barbette Reichmann Treating Alanys Godino/Extender: Linwood Dibbles, HOYT Weeks in Treatment: 2 Vital Signs Time Taken: 14:24 Temperature (F): 97.6 Height (in): 59 Pulse (bpm): 96 Weight (lbs): 98 Respiratory Rate (breaths/min): 18 Body Mass Index (BMI): 19.8 Blood Pressure (mmHg): 136/77 Reference Range: 80 - 120 mg / dl Electronic Signature(s) Signed: 05/04/2019 5:54:26 PM By: Elliot Gurney, BSN, RN, CWS, Kim RN, BSN Entered By: Elliot Gurney, BSN, RN, CWS, Kim on 04/30/2019 14:24:40

## 2019-05-07 ENCOUNTER — Other Ambulatory Visit: Payer: Self-pay

## 2019-05-07 ENCOUNTER — Encounter: Payer: Medicare Other | Attending: Physician Assistant | Admitting: Physician Assistant

## 2019-05-07 DIAGNOSIS — L97322 Non-pressure chronic ulcer of left ankle with fat layer exposed: Secondary | ICD-10-CM | POA: Diagnosis not present

## 2019-05-07 DIAGNOSIS — Z7902 Long term (current) use of antithrombotics/antiplatelets: Secondary | ICD-10-CM | POA: Diagnosis not present

## 2019-05-07 DIAGNOSIS — Z8249 Family history of ischemic heart disease and other diseases of the circulatory system: Secondary | ICD-10-CM | POA: Insufficient documentation

## 2019-05-07 DIAGNOSIS — I10 Essential (primary) hypertension: Secondary | ICD-10-CM | POA: Insufficient documentation

## 2019-05-07 DIAGNOSIS — I482 Chronic atrial fibrillation, unspecified: Secondary | ICD-10-CM | POA: Insufficient documentation

## 2019-05-07 DIAGNOSIS — Z87891 Personal history of nicotine dependence: Secondary | ICD-10-CM | POA: Diagnosis not present

## 2019-05-07 NOTE — Progress Notes (Signed)
AUSTRALIA, DROLL ([id number]) Visit Report for 05/07/2019 Arrival Information Details Patient Name: [name], [name]. Date of Service: 05/07/2019 1:00 PM Medical Record Number: [id number] Patient Account Number: [id number] Date of Birth/Sex: 1925-04-03 (85 y.o. F) Treating RN: Arnette Norris Primary Care Penina Reisner: Barbette Reichmann Other Clinician: Referring Dani Wallner: Barbette Reichmann Treating Ambre Kobayashi/Extender: Linwood Dibbles, HOYT Weeks in Treatment: 3 Visit Information History Since Last Visit Added or deleted any medications: No Patient Arrived: Wheel Chair Any new allergies or adverse reactions: No Arrival Time: 13:13 Had a fall or experienced change in No activities of daily living that may affect Accompanied By: daughter risk of falls: Transfer Assistance: None Signs or symptoms of abuse/neglect since last visito No Patient Identification Verified: Yes Hospitalized since last visit: No Secondary Verification Process Completed: Yes Has Dressing in Place as Prescribed: Yes Patient Requires Transmission-Based No Pain Present Now: Yes Precautions: Electronic Signature(s) Signed: 05/07/2019 2:03:23 PM By: Arnette Norris Entered By: Arnette Norris on 05/07/2019 13:20:44 Georgeann Oppenheim ([id number]) -------------------------------------------------------------------------------- Clinic Level of Care Assessment Details Patient Name: Danielle Coco T. Date of Service: 05/07/2019 1:00 PM Medical Record Number: [id number] Patient Account Number: [id number] Date of Birth/Sex: Oct 18, 1925 (85 y.o. F) Treating RN: Curtis Sites Primary Care Taunia Frasco: Barbette Reichmann Other Clinician: Referring Tae Robak: Barbette Reichmann Treating Shabnam Ladd/Extender: Linwood Dibbles, HOYT Weeks in Treatment: 3 Clinic Level of Care Assessment Items TOOL 4 Quantity Score  - Use when only an EandM is performed on FOLLOW-UP visit 0 ASSESSMENTS - Nursing Assessment / Reassessment X - Reassessment of  Co-morbidities (includes updates in patient status) 1 10 X- 1 5 Reassessment of Adherence to Treatment Plan ASSESSMENTS - Wound and Skin Assessment / Reassessment X - Simple Wound Assessment / Reassessment - one wound 1 5  - 0 Complex Wound Assessment / Reassessment - multiple wounds  - 0 Dermatologic / Skin Assessment (not related to wound area) ASSESSMENTS - Focused Assessment  - Circumferential Edema Measurements - multi extremities 0  - 0 Nutritional Assessment / Counseling / Intervention X- 1 5 Lower Extremity Assessment (monofilament, tuning fork, pulses)  - 0 Peripheral Arterial Disease Assessment (using hand held doppler) ASSESSMENTS - Ostomy and/or Continence Assessment and Care  - Incontinence Assessment and Management 0  - 0 Ostomy Care Assessment and Management (repouching, etc.) PROCESS - Coordination of Care X - Simple Patient / Family Education for ongoing care 1 15  - 0 Complex (extensive) Patient / Family Education for ongoing care X- 1 10 Staff obtains Chiropractor, Records, Test Results / Process Orders  - 0 Staff telephones HHA, Nursing Homes / Clarify orders / etc  - 0 Routine Transfer to another Facility (non-emergent condition)  - 0 Routine Hospital Admission (non-emergent condition)  - 0 New Admissions / Manufacturing engineer / Ordering NPWT, Apligraf, etc.  - 0 Emergency Hospital Admission (emergent condition) X- 1 10 Simple Discharge Coordination JAYLEAN, BUENAVENTURA T. ([id number])  - 0 Complex (extensive) Discharge Coordination PROCESS - Special Needs  - Pediatric / Minor Patient Management 0  - 0 Isolation Patient Management  - 0 Hearing / Language / Visual special needs  - 0 Assessment of Community assistance (transportation, D/C planning, etc.)  - 0 Additional assistance / Altered mentation  - 0 Support Surface(s) Assessment (bed, cushion, seat, etc.) INTERVENTIONS - Wound Cleansing / Measurement X  - Simple Wound Cleansing - one wound 1 5  - 0 Complex Wound Cleansing - multiple wounds X- 1 5 Wound Imaging (photographs - any number of wounds)  - 0 Wound Tracing (instead of  photographs) X- 1 5 Simple Wound Measurement - one wound []  - 0 Complex Wound Measurement - multiple wounds INTERVENTIONS - Wound Dressings []  - Small Wound Dressing one or multiple wounds 0 X- 1 15 Medium Wound Dressing one or multiple wounds []  - 0 Large Wound Dressing one or multiple wounds []  - 0 Application of Medications - topical []  - 0 Application of Medications - injection INTERVENTIONS - Miscellaneous []  - External ear exam 0 []  - 0 Specimen Collection (cultures, biopsies, blood, body fluids, etc.) []  - 0 Specimen(s) / Culture(s) sent or taken to Lab for analysis []  - 0 Patient Transfer (multiple staff / Nurse, adultHoyer Lift / Similar devices) []  - 0 Simple Staple / Suture removal (25 or less) []  - 0 Complex Staple / Suture removal (26 or more) []  - 0 Hypo / Hyperglycemic Management (close monitor of Blood Glucose) []  - 0 Ankle / Brachial Index (ABI) - do not check if billed separately X- 1 5 Vital Signs Sciandra, Laveyah T. ([id number]) Has the patient been seen at the hospital within the last three years: Yes Total Score: 95 Level Of Care: New/Established - Level 3 Electronic Signature(s) Signed: 05/07/2019 4:32:34 PM By: Curtis Sitesorthy, Joanna Entered By: Curtis Sitesorthy, Joanna on 05/07/2019 13:51:47 Georgeann OppenheimGRAHAM, Sherrian T. ([id number]) -------------------------------------------------------------------------------- Encounter Discharge Information Details Patient Name: Danielle CocoGRAHAM, [name] Date of Service: 05/07/2019 1:00 PM Medical Record Number: [id number] Patient Account Number: [id number] Date of Birth/Sex: 11/24/1925 (85 y.o. F) Treating RN: Curtis Sitesorthy, Joanna Primary Care Osmani Kersten: Barbette ReichmannHande, Vishwanath Other Clinician: Referring Augie Vane: Barbette ReichmannHande, Vishwanath Treating Geovonni Meyerhoff/Extender: Linwood DibblesSTONE III, HOYT Weeks in  Treatment: 3 Encounter Discharge Information Items Discharge Condition: Stable Ambulatory Status: Wheelchair Discharge Destination: Home Transportation: Private Auto Accompanied By: daughter Schedule Follow-up Appointment: Yes Clinical Summary of Care: Electronic Signature(s) Signed: 05/07/2019 4:32:34 PM By: Curtis Sitesorthy, Joanna Entered By: Curtis Sitesorthy, Joanna on 05/07/2019 13:53:10 Georgeann OppenheimGRAHAM, Jacklynn T. ([id number]) -------------------------------------------------------------------------------- Lower Extremity Assessment Details Patient Name: Danielle CocoGRAHAM, Alexandre T. Date of Service: 05/07/2019 1:00 PM Medical Record Number: [id number] Patient Account Number: [id number] Date of Birth/Sex: 12/27/1924 (85 y.o. F) Treating RN: Arnette NorrisBiell, Kristina Primary Care Miner Koral: Barbette ReichmannHande, Vishwanath Other Clinician: Referring Charlcie Prisco: Barbette ReichmannHande, Vishwanath Treating Erienne Spelman/Extender: Linwood DibblesSTONE III, HOYT Weeks in Treatment: 3 Edema Assessment Assessed: [Left: No] [Right: No] [Left: Edema] [Right: :] Calf Left: Right: Point of Measurement: 29 cm From Medial Instep 27 cm cm Ankle Left: Right: Point of Measurement: 12 cm From Medial Instep 18 cm cm Vascular Assessment Pulses: Posterior Tibial Palpable: [Left:Yes] Electronic Signature(s) Signed: 05/07/2019 2:03:23 PM By: Arnette NorrisBiell, Kristina Entered By: Arnette NorrisBiell, Kristina on 05/07/2019 13:26:49 Georgeann OppenheimGRAHAM, Alitza T. ([id number]) -------------------------------------------------------------------------------- Multi Wound Chart Details Patient Name: Danielle CocoGRAHAM, Duchess T. Date of Service: 05/07/2019 1:00 PM Medical Record Number: [id number] Patient Account Number: [id number] Date of Birth/Sex: 07/09/1925 (85 y.o. F) Treating RN: Curtis Sitesorthy, Joanna Primary Care Lottie Siska: Barbette ReichmannHande, Vishwanath Other Clinician: Referring Brit Carbonell: Barbette ReichmannHande, Vishwanath Treating Lynnea Vandervoort/Extender: Linwood DibblesSTONE III, HOYT Weeks in Treatment: 3 Vital Signs Height(in): 59 Pulse(bpm): 93 Weight(lbs): 98 Blood Pressure(mmHg):  128/67 Body Mass Index(BMI): 20 Temperature(F): 97.8 Respiratory Rate 18 (breaths/min): Photos: [N/A:N/A] Wound Location: Left Malleolus - Lateral N/A N/A Wounding Event: Gradually Appeared N/A N/A Primary Etiology: Arterial Insufficiency Ulcer N/A N/A Comorbid History: Arrhythmia, Hypertension, N/A N/A Peripheral Venous Disease, History of pressure wounds Date Acquired: 01/31/2019 N/A N/A Weeks of Treatment: 3 N/A N/A Wound Status: Open N/A N/A Clustered Wound: Yes N/A N/A Clustered Quantity: 4 N/A N/A Measurements L x W x D 2.8x4.5x0.2 N/A N/A (cm) Area (cm) : 9.896 N/A N/A Volume (cm) : 1.979  N/A N/A % Reduction in Area: -112.10% N/A N/A % Reduction in Volume: -112.10% N/A N/A Classification: Full Thickness Without N/A N/A Exposed Support Structures Exudate Amount: Medium N/A N/A Exudate Type: Serous N/A N/A Exudate Color: amber N/A N/A Wound Margin: Flat and Intact N/A N/A Granulation Amount: Medium (34-66%) N/A N/A Granulation Quality: Red, Hyper-granulation N/A N/A Necrotic Amount: Medium (34-66%) N/A N/A Exposed Structures: Fat Layer (Subcutaneous N/A N/A Tissue) Exposed: Yes Fascia: No LAASYA, ALMAZAN T. ([id number]) Tendon: No Muscle: No Joint: No Bone: No Epithelialization: None N/A N/A Treatment Notes Electronic Signature(s) Signed: 05/07/2019 4:32:34 PM By: Curtis Sites Entered By: Curtis Sites on 05/07/2019 13:41:57 Georgeann Oppenheim ([id number]) -------------------------------------------------------------------------------- Multi-Disciplinary Care Plan Details Patient Name: Danielle Coco T. Date of Service: 05/07/2019 1:00 PM Medical Record Number: [id number] Patient Account Number: [id number] Date of Birth/Sex: May 21, 1925 (85 y.o. F) Treating RN: Curtis Sites Primary Care Westlyn Glaza: Barbette Reichmann Other Clinician: Referring Cori Justus: Barbette Reichmann Treating Ekam Besson/Extender: Linwood Dibbles, HOYT Weeks in Treatment: 3 Active  Inactive Abuse / Safety / Falls / Self Care Management Nursing Diagnoses: Potential for falls Goals: Patient will not experience any injury related to falls Date Initiated: 04/16/2019 Target Resolution Date: 07/10/2019 Goal Status: Active Interventions: Assess fall risk on admission and as needed Notes: Nutrition Nursing Diagnoses: Potential for alteratiion in Nutrition/Potential for imbalanced nutrition Goals: Patient/caregiver agrees to and verbalizes understanding of need to use nutritional supplements and/or vitamins as prescribed Date Initiated: 04/16/2019 Target Resolution Date: 07/10/2019 Goal Status: Active Interventions: Assess patient nutrition upon admission and as needed per policy Notes: Orientation to the Wound Care Program Nursing Diagnoses: Knowledge deficit related to the wound healing center program Goals: Patient/caregiver will verbalize understanding of the Wound Healing Center Program Date Initiated: 04/16/2019 Target Resolution Date: 07/10/2019 Goal Status: Active Interventions: Provide education on orientation to the wound center [place], Jule Ser T. ([id number]) Notes: Wound/Skin Impairment Nursing Diagnoses: Impaired tissue integrity Goals: Ulcer/skin breakdown will heal within 14 weeks Date Initiated: 04/16/2019 Target Resolution Date: 07/10/2019 Goal Status: Active Interventions: Assess patient/caregiver ability to obtain necessary supplies Assess patient/caregiver ability to perform ulcer/skin care regimen upon admission and as needed Assess ulceration(s) every visit Notes: Electronic Signature(s) Signed: 05/07/2019 4:32:34 PM By: Curtis Sites Entered By: Curtis Sites on 05/07/2019 13:41:50 Georgeann Oppenheim ([id number]) -------------------------------------------------------------------------------- Pain Assessment Details Patient Name: Danielle Coco T. Date of Service: 05/07/2019 1:00 PM Medical Record Number: [id number] Patient Account Number:  [id number] Date of Birth/Sex: 12-30-1924 (85 y.o. F) Treating RN: Arnette Norris Primary Care Mahlani Berninger: Barbette Reichmann Other Clinician: Referring Shamira Toutant: Barbette Reichmann Treating Kestrel Mis/Extender: Linwood Dibbles, HOYT Weeks in Treatment: 3 Active Problems Location of Pain Severity and Description of Pain Patient Has Paino Yes Site Locations Rate the pain. Current Pain Level: 5 Pain Management and Medication Current Pain Management: Electronic Signature(s) Signed: 05/07/2019 2:03:23 PM By: Arnette Norris Entered By: Arnette Norris on 05/07/2019 13:20:55 Georgeann Oppenheim ([id number]) -------------------------------------------------------------------------------- Patient/Caregiver Education Details Patient Name: Danielle Coco T. Date of Service: 05/07/2019 1:00 PM Medical Record Number: [id number] Patient Account Number: [id number] Date of Birth/Gender: [birth year] (85 y.o. F) Treating RN: Curtis Sites Primary Care Physician: Barbette Reichmann Other Clinician: Referring Physician: Barbette Reichmann Treating Physician/Extender: Skeet Simmer in Treatment: 3 Education Assessment Education Provided To: Caregiver Education Topics Provided Wound/Skin Impairment: Handouts: Other: wound care as ordered Methods: Demonstration, Explain/Verbal Responses: State content correctly Electronic Signature(s) Signed: 05/07/2019 4:32:34 PM By: Curtis Sites Entered By: Curtis Sites on 05/07/2019 13:52:15 Georgeann Oppenheim ([id number]) -------------------------------------------------------------------------------- Wound Assessment Details Patient Name: PALLEY,  Marenda T. Date of Service: 05/07/2019 1:00 PM Medical Record Number: [id number] Patient Account Number: [id number] Date of Birth/Sex: 07-04-1925 (85 y.o. F) Treating RN: Arnette Norris Primary Care Quantasia Stegner: Barbette Reichmann Other Clinician: Referring Darion Juhasz: Barbette Reichmann Treating Temperence Zenor/Extender: Linwood Dibbles,  HOYT Weeks in Treatment: 3 Wound Status Wound Number: 1 Primary Arterial Insufficiency Ulcer Etiology: Wound Location: Left Malleolus - Lateral Wound Open Wounding Event: Gradually Appeared Status: Date Acquired: 01/31/2019 Comorbid Arrhythmia, Hypertension, Peripheral Venous Weeks Of Treatment: 3 History: Disease, History of pressure wounds Clustered Wound: Yes Photos Wound Measurements Length: (cm) 2.8 % Reduct Width: (cm) 4.5 % Reduct Depth: (cm) 0.2 Epitheli Clustered Quantity: 4 Tunnelin Area: (cm) 9.896 Undermi Volume: (cm) 1.979 ion in Area: -112.1% ion in Volume: -112.1% alization: None g: No ning: No Wound Description Full Thickness Without Exposed Support Foul Odo Classification: Structures Slough/F Wound Margin: Flat and Intact Exudate Medium Amount: Exudate Type: Serous Exudate Color: amber r After Cleansing: No ibrino Yes Wound Bed Granulation Amount: Medium (34-66%) Exposed Structure Granulation Quality: Red, Hyper-granulation Fascia Exposed: No Necrotic Amount: Medium (34-66%) Fat Layer (Subcutaneous Tissue) Exposed: Yes Necrotic Quality: Adherent Slough Tendon Exposed: No Muscle Exposed: No Joint Exposed: No Bone Exposed: No Sainsbury, Denette T. ([id number]) Treatment Notes Wound #1 (Left, Lateral Malleolus) Notes zinc to peri wound, silvercel, xtrasorb, conform and tubigrip Electronic Signature(s) Signed: 05/07/2019 2:03:23 PM By: Arnette Norris Entered By: Arnette Norris on 05/07/2019 13:26:24 Georgeann Oppenheim ([id number]) -------------------------------------------------------------------------------- Vitals Details Patient Name: Danielle Coco T. Date of Service: 05/07/2019 1:00 PM Medical Record Number: [id number] Patient Account Number: [id number] Date of Birth/Sex: 10-12-1925 (85 y.o. F) Treating RN: Arnette Norris Primary Care Renardo Cheatum: Barbette Reichmann Other Clinician: Referring Shunna Mikaelian: Barbette Reichmann Treating  Calandra Madura/Extender: Linwood Dibbles, HOYT Weeks in Treatment: 3 Vital Signs Time Taken: 13:20 Temperature (F): 97.8 Height (in): 59 Pulse (bpm): 93 Weight (lbs): 98 Respiratory Rate (breaths/min): 18 Body Mass Index (BMI): 19.8 Blood Pressure (mmHg): 128/67 Reference Range: 80 - 120 mg / dl Electronic Signature(s) Signed: 05/07/2019 2:03:23 PM By: Arnette Norris Entered By: Arnette Norris on 05/07/2019 13:21:18

## 2019-05-07 NOTE — Progress Notes (Signed)
PREETI, WINEGARDNER ([id number]) Visit Report for 05/07/2019 Chief Complaint Document Details Patient Name: [name], [name]. Date of Service: 05/07/2019 1:00 PM Medical Record Number: [id number] Patient Account Number: [id number] Date of Birth/Sex: 25-Jun-1925 (85 y.o. F) Treating RN: Huel Coventry Primary Care Provider: Barbette Reichmann Other Clinician: Referring Provider: Barbette Reichmann Treating Provider/Extender: Linwood Dibbles, HOYT Weeks in Treatment: 3 Information Obtained from: Patient Chief Complaint Left lateral ankle ulcer Electronic Signature(s) Signed: 05/07/2019 4:38:37 PM By: Lenda Kelp PA-C Entered By: Lenda Kelp on 05/07/2019 13:12:13 [name] ([id number]) -------------------------------------------------------------------------------- HPI Details Patient Name: [name] T. Date of Service: 05/07/2019 1:00 PM Medical Record Number: [id number] Patient Account Number: [id number] Date of Birth/Sex: February 08, 1925 (85 y.o. F) Treating RN: Huel Coventry Primary Care Provider: Barbette Reichmann Other Clinician: Referring Provider: Barbette Reichmann Treating Provider/Extender: Linwood Dibbles, HOYT Weeks in Treatment: 3 History of Present Illness HPI Description: 04/16/19 patient presents today for initial evaluation our clinic concerning an issue with her left lateral ankle which has been present for roughly 7 years. She intermittently has had issues with infection unfortunately. She's also been on IV antibiotics back in August/September 2019. She has not had a positive test for osteomyelitis up to this point in her last MRI which was February 2019 was negative. She has had private work on her lower extremities having had a fem-pop bypass of the lower extremity on the left. She subsequently has had angioplasty as well as stenting of this extremity. Nonetheless overall it appears that she's had quite a bit of extensive work. Currently this healed in January 2020 when she was  being seen at the [facility] wound care center. Subsequently it unfortunately reopened she tells me in March and the patient's daughter who was present during the evaluation concurs. The patient is a previous smoker she is on Plavix currently. She has no history of diabetes. We were unable to obtain an ABI due to what appears to be poor flowing to left lower extremity currently. No fevers, chills, nausea, or vomiting noted at this time. 04/30/19 on evaluation today patient actually appears to be doing fairly well in regard to her ulcer in my pinion. In fact I feel like she's showing some signs of improvement at this time which is excellent news despite the fact that were still waiting the vascular appointment. That's in just one weeks time. She has the appointment vascular on June 3 which is next Wednesday. Overall I feel like that's there's no signs of active infection at this time which is good news. I did review her culture which showed positive for Pseudomonas but again I feel like this is likely secondary to just normal skin contamination especially considering the fact that her wound is doing so well currently. Therefore I'm not going to initiate any different therapy at this point with regard to the culture results if anything changes then I will keep this in mind. 05/07/19 on evaluation today patient appears to be doing well in regard to her left lateral ankle ulcer. She is having a lot of drainage she did see her vascular surgeon and fortunately everything seems to be doing okay in that regard. I do not have that note for review yet we look to not they but unfortunately the note is not present for review as of yet. Nonetheless the patient's daughter is patient with her during the visit today and states that they were advised by the physician and vascular that there really was not anything to intervene with at  this point and that all of the testing looks okay and does not appear to change from the  prior testing. This is good news and hopefully means that the patient will have no difficulty healing with the appropriate dressing changes and interventions. Fortunately there's no signs of active infection at this time. Electronic Signature(s) Signed: 05/07/2019 4:38:37 PM By: Lenda KelpStone III, Hoyt PA-C Entered By: Lenda KelpStone III, Hoyt on 05/07/2019 13:54:06 Danielle OppenheimGRAHAM, [name] ([id number]) -------------------------------------------------------------------------------- Physical Exam Details Patient Name: Danielle CocoGRAHAM, [name] Date of Service: 05/07/2019 1:00 PM Medical Record Number: [id number] Patient Account Number: [id number] Date of Birth/Sex: 07/09/1925 (85 y.o. F) Treating RN: Huel CoventryWoody, Kim Primary Care Provider: Barbette ReichmannHande, Vishwanath Other Clinician: Referring Provider: Barbette ReichmannHande, Vishwanath Treating Provider/Extender: Linwood DibblesSTONE III, HOYT Weeks in Treatment: 3 Constitutional Well-nourished and well-hydrated in no acute distress. Respiratory normal breathing without difficulty. clear to auscultation bilaterally. Cardiovascular regular rate and rhythm with normal S1, S2. Psychiatric this patient is able to make decisions and demonstrates good insight into disease process. Alert and Oriented x 3. pleasant and cooperative. Notes My suggestion based on what I'm seeing in regard to the wounds is that no sharp debridement was necessary mechanical be removed with saline and gauze was performed in order to clear away some of the slough on the surface of the wound patient tolerated this without complication. With that being said I do think there's a great need for us to control the fluid can manage this mail appropriately hopefully will be able to do that without any complication. Electronic Signature(s) Signed: 05/07/2019 4:38:37 PM By: Lenda KelpStone III, Hoyt PA-C Entered By: Lenda KelpStone III, Hoyt on 05/07/2019 13:54:48 Danielle OppenheimGRAHAM, [name]  ([id number]) -------------------------------------------------------------------------------- Physician Orders Details Patient Name: Danielle CocoGRAHAM, Thara T. Date of Service: 05/07/2019 1:00 PM Medical Record Number: [id number] Patient Account Number: [id number] Date of Birth/Sex: 08/19/1925 (85 y.o. F) Treating RN: Curtis Sitesorthy, Joanna Primary Care Provider: Barbette ReichmannHande, Vishwanath Other Clinician: Referring Provider: Barbette ReichmannHande, Vishwanath Treating Provider/Extender: Linwood DibblesSTONE III, HOYT Weeks in Treatment: 3 Verbal / Phone Orders: No Diagnosis Coding ICD-10 Coding Code Description I73.89 Other specified peripheral vascular diseases L97.322 Non-pressure chronic ulcer of left ankle with fat layer exposed I48.20 Chronic atrial fibrillation, unspecified I10 Essential (primary) hypertension Wound Cleansing Wound #1 Left,Lateral Malleolus o Clean wound with Normal Saline. Anesthetic (add to Medication List) Wound #1 Left,Lateral Malleolus o Topical Lidocaine 4% cream applied to wound bed prior to debridement (In Clinic Only). Skin Barriers/Peri-Wound Care Wound #1 Left,Lateral Malleolus o Barrier cream - to peri wound Primary Wound Dressing Wound #1 Left,Lateral Malleolus o Silver Alginate - PLEASE ORDER SILVERCEL NON ADHERENT FOR PATIENT Secondary Dressing Wound #1 Left,Lateral Malleolus o Conform/Kerlix o XtraSorb - PLEASE PROVIDE XTRASRB FOR PATIENT R/T MACERATION Dressing Change Frequency Wound #1 Left,Lateral Malleolus o Change Dressing Monday, Wednesday, Friday Follow-up Appointments Wound #1 Left,Lateral Malleolus o Return Appointment in 1 week. Edema Control Wound #1 Left,Lateral Malleolus o Other: - Nunzio CoryubiGrip E Good, [name] ([id number]) Home Health Wound #1 Left,Lateral Malleolus o Continue Home Health Visits - [facility] o Home Health Nurse may visit PRN to address patientos wound care needs. o FACE TO FACE ENCOUNTER: MEDICARE and MEDICAID PATIENTS: I certify that  this patient is under my care and that I had a face-to-face encounter that meets the physician face-to-face encounter requirements with this patient on this date. The encounter with the patient was in whole or in part for the following MEDICAL CONDITION: (primary reason for Home Healthcare) MEDICAL NECESSITY: I certify, that based on my findings, NURSING services are a  medically necessary home health service. HOME BOUND STATUS: I certify that my clinical findings support that this patient is homebound (i.e., Due to illness or injury, pt requires aid of supportive devices such as crutches, cane, wheelchairs, walkers, the use of special transportation or the assistance of another person to leave their place of residence. There is a normal inability to leave the home and doing so requires considerable and taxing effort. Other absences are for medical reasons / religious services and are infrequent or of short duration when for other reasons). o If current dressing causes regression in wound condition, may D/C ordered dressing product/s and apply Normal Saline Moist Dressing daily until next Wound Healing Center / Other MD appointment. Notify Wound Healing Center of regression in wound condition at [phone]. o Please direct any NON-WOUND related issues/requests for orders to patient's Primary Care Physician Electronic Signature(s) Signed: 05/07/2019 4:32:34 PM By: Curtis Sites Signed: 05/07/2019 4:38:37 PM By: Lenda Kelp PA-C Entered By: Curtis Sites on 05/07/2019 13:50:31 [name] ([id number]) -------------------------------------------------------------------------------- Problem List Details Patient Name: [name] T. Date of Service: 05/07/2019 1:00 PM Medical Record Number: [id number] Patient Account Number: [id number] Date of Birth/Sex: [birth year] (85 y.o. F) Treating RN: Huel Coventry Primary Care Provider: Barbette Reichmann Other Clinician: Referring Provider:  Barbette Reichmann Treating Provider/Extender: Linwood Dibbles, HOYT Weeks in Treatment: 3 Active Problems ICD-10 Evaluated Encounter Code Description Active Date Today Diagnosis I73.89 Other specified peripheral vascular diseases 04/16/2019 No Yes L97.322 Non-pressure chronic ulcer of left ankle with fat layer 04/16/2019 No Yes exposed I48.20 Chronic atrial fibrillation, unspecified 04/16/2019 No Yes I10 Essential (primary) hypertension 04/16/2019 No Yes Inactive Problems Resolved Problems Electronic Signature(s) Signed: 05/07/2019 4:38:37 PM By: Lenda Kelp PA-C Entered By: Lenda Kelp on 05/07/2019 13:12:05 [name] ([id number]) -------------------------------------------------------------------------------- Progress Note Details Patient Name: [name] T. Date of Service: 05/07/2019 1:00 PM Medical Record Number: [id number] Patient Account Number: [id number] Date of Birth/Sex: 05-24-1925 (85 y.o. F) Treating RN: Huel Coventry Primary Care Provider: Barbette Reichmann Other Clinician: Referring Provider: Barbette Reichmann Treating Provider/Extender: Linwood Dibbles, HOYT Weeks in Treatment: 3 Subjective Chief Complaint Information obtained from Patient Left lateral ankle ulcer History of Present Illness (HPI) 04/16/19 patient presents today for initial evaluation our clinic concerning an issue with her left lateral ankle which has been present for roughly 7 years. She intermittently has had issues with infection unfortunately. She's also been on IV antibiotics back in August/September 2019. She has not had a positive test for osteomyelitis up to this point in her last MRI which was February 2019 was negative. She has had private work on her lower extremities having had a fem-pop bypass of the lower extremity on the left. She subsequently has had angioplasty as well as stenting of this extremity. Nonetheless overall it appears that she's had quite a bit of extensive work.  Currently this healed in January 2020 when she was being seen at the [facility] wound care center. Subsequently it unfortunately reopened she tells me in March and the patient's daughter who was present during the evaluation concurs. The patient is a previous smoker she is on Plavix currently. She has no history of diabetes. We were unable to obtain an ABI due to what appears to be poor flowing to left lower extremity currently. No fevers, chills, nausea, or vomiting noted at this time. 04/30/19 on evaluation today patient actually appears to be doing fairly well in regard to her ulcer in my pinion. In fact I feel like she's  showing some signs of improvement at this time which is excellent news despite the fact that were still waiting the vascular appointment. That's in just one weeks time. She has the appointment vascular on June 3 which is next Wednesday. Overall I feel like that's there's no signs of active infection at this time which is good news. I did review her culture which showed positive for Pseudomonas but again I feel like this is likely secondary to just normal skin contamination especially considering the fact that her wound is doing so well currently. Therefore I'm not going to initiate any different therapy at this point with regard to the culture results if anything changes then I will keep this in mind. 05/07/19 on evaluation today patient appears to be doing well in regard to her left lateral ankle ulcer. She is having a lot of drainage she did see her vascular surgeon and fortunately everything seems to be doing okay in that regard. I do not have that note for review yet we look to not they but unfortunately the note is not present for review as of yet. Nonetheless the patient's daughter is patient with her during the visit today and states that they were advised by the physician and vascular that there really was not anything to intervene with at this point and that all of the testing  looks okay and does not appear to change from the prior testing. This is good news and hopefully means that the patient will have no difficulty healing with the appropriate dressing changes and interventions. Fortunately there's no signs of active infection at this time. Patient History Information obtained from Patient. Family History Heart Disease - Father, Hypertension - Father, No family history of Cancer, Diabetes, Hereditary Spherocytosis, Kidney Disease, Lung Disease, Seizures, Stroke, Thyroid Problems, Tuberculosis. Social History Former smoker - ended on 12/02/2018, Marital Status - Widowed, Alcohol Use - Never, Drug Use - No History. Medical History Eyes ESABEL, TESMER ([id number]) Denies history of Cataracts, Glaucoma, Optic Neuritis Ear/Nose/Mouth/Throat Denies history of Chronic sinus problems/congestion, Middle ear problems Hematologic/Lymphatic Denies history of Anemia, Hemophilia, Human Immunodeficiency Virus, Lymphedema, Sickle Cell Disease Respiratory Denies history of Aspiration, Asthma, Chronic Obstructive Pulmonary Disease (COPD), Pneumothorax, Sleep Apnea, Tuberculosis Cardiovascular Patient has history of Arrhythmia - A Fib, Tachycardia, palpitations, Hypertension, Peripheral Venous Disease Endocrine Denies history of Type I Diabetes, Type II Diabetes Genitourinary Denies history of End Stage Renal Disease Integumentary (Skin) Patient has history of History of pressure wounds - Current wound 2013 Denies history of History of Burn Musculoskeletal Denies history of Gout Oncologic Denies history of Received Chemotherapy, Received Radiation Hospitalization/Surgery History - Abdominal hysterectomy. - Angiogram 12/15/12,09/07/14. - Angiogram 05/2016. - Angioplasty / Stenting iliac. - Colonoscopy 2001. - Pulmonary angioplasty 05/15/2017. Medical And Surgical History Notes Musculoskeletal Degenerative joint disease, Degenerative arthritis. osteoporosis Review of  Systems (ROS) Constitutional Symptoms (General Health) Denies complaints or symptoms of Fatigue, Fever, Chills, Marked Weight Change. Respiratory Denies complaints or symptoms of Chronic or frequent coughs, Shortness of Breath. Cardiovascular Complains or has symptoms of LE edema. Denies complaints or symptoms of Chest pain. Psychiatric Denies complaints or symptoms of Anxiety, Claustrophobia. Objective Constitutional Well-nourished and well-hydrated in no acute distress. Vitals Time Taken: 1:20 PM, Height: 59 in, Weight: 98 lbs, BMI: 19.8, Temperature: 97.8 F, Pulse: 93 bpm, Respiratory Rate: 18 breaths/min, Blood Pressure: 128/67 mmHg. Respiratory normal breathing without difficulty. clear to auscultation bilaterally. [name], [name] ([id number]) Cardiovascular regular rate and rhythm with normal S1, S2. Psychiatric this patient is  able to make decisions and demonstrates good insight into disease process. Alert and Oriented x 3. pleasant and cooperative. General Notes: My suggestion based on what I'm seeing in regard to the wounds is that no sharp debridement was necessary mechanical be removed with saline and gauze was performed in order to clear away some of the slough on the surface of the wound patient tolerated this without complication. With that being said I do think there's a great need for [place] to control the fluid can manage this mail appropriately hopefully will be able to do that without any complication. Integumentary (Hair, Skin) Wound #1 status is Open. Original cause of wound was Gradually Appeared. The wound is located on the Left,Lateral Malleolus. The wound measures 2.8cm length x 4.5cm width x 0.2cm depth; 9.896cm^2 area and 1.979cm^3 volume. There is Fat Layer (Subcutaneous Tissue) Exposed exposed. There is no tunneling or undermining noted. There is a medium amount of serous drainage noted. The wound margin is flat and intact. There is medium (34-66%) red,  hyper - granulation within the wound bed. There is a medium (34-66%) amount of necrotic tissue within the wound bed including Adherent Slough. Assessment Active Problems ICD-10 Other specified peripheral vascular diseases Non-pressure chronic ulcer of left ankle with fat layer exposed Chronic atrial fibrillation, unspecified Essential (primary) hypertension Plan Wound Cleansing: Wound #1 Left,Lateral Malleolus: Clean wound with Normal Saline. Anesthetic (add to Medication List): Wound #1 Left,Lateral Malleolus: Topical Lidocaine 4% cream applied to wound bed prior to debridement (In Clinic Only). Skin Barriers/Peri-Wound Care: Wound #1 Left,Lateral Malleolus: Barrier cream - to peri wound Primary Wound Dressing: Wound #1 Left,Lateral Malleolus: Silver Alginate - PLEASE ORDER SILVERCEL NON ADHERENT FOR PATIENT Secondary Dressing: Wound #1 Left,Lateral Malleolus: Conform/Kerlix XtraSorb - PLEASE PROVIDE XTRASRB FOR PATIENT R/T [name], [name] ([id number]) Dressing Change Frequency: Wound #1 Left,Lateral Malleolus: Change Dressing Monday, Wednesday, Friday Follow-up Appointments: Wound #1 Left,Lateral Malleolus: Return Appointment in 1 week. Edema Control: Wound #1 Left,Lateral Malleolus: Other: - TubiGrip E Home Health: Wound #1 Left,Lateral Malleolus: Continue Home Health Visits - [facility] Nurse may visit PRN to address patient s wound care needs. FACE TO FACE ENCOUNTER: MEDICARE and MEDICAID PATIENTS: I certify that this patient is under my care and that I had a face-to-face encounter that meets the physician face-to-face encounter requirements with this patient on this date. The encounter with the patient was in whole or in part for the following MEDICAL CONDITION: (primary reason for Home Healthcare) MEDICAL NECESSITY: I certify, that based on my findings, NURSING services are a medically necessary home health service. HOME BOUND STATUS: I  certify that my clinical findings support that this patient is homebound (i.e., Due to illness or injury, pt requires aid of supportive devices such as crutches, cane, wheelchairs, walkers, the use of special transportation or the assistance of another person to leave their place of residence. There is a normal inability to leave the home and doing so requires considerable and taxing effort. Other absences are for medical reasons / religious services and are infrequent or of short duration when for other reasons). If current dressing causes regression in wound condition, may D/C ordered dressing product/s and apply Normal Saline Moist Dressing daily until next Wound Healing Center / Other MD appointment. Notify Wound Healing Center of regression in wound condition at [phone]. Please direct any NON-WOUND related issues/requests for orders to patient's Primary Care Physician My suggestion at this point is gonna be that we go ahead and initiate  the above wound care measures for the next week and the patient is in agreement with plan. We will subsequently see were things stand at follow-up. If anything changes or worsens meantime she will contact the office and let me know what else her daughter will for that matter. Please see above for specific wound care orders. We will see patient for re-evaluation in 1 week(s) here in the clinic. If anything worsens or changes patient will contact our office for additional recommendations. Electronic Signature(s) Signed: 05/07/2019 4:38:37 PM By: Lenda Kelp PA-C Entered By: Lenda Kelp on 05/07/2019 13:55:14 [name] ([id number]) -------------------------------------------------------------------------------- ROS/PFSH Details Patient Name: [name] T. Date of Service: 05/07/2019 1:00 PM Medical Record Number: [id number] Patient Account Number: [id number] Date of Birth/Sex: [birth year] (85 y.o. F) Treating RN: Huel Coventry Primary Care  Provider: Barbette Reichmann Other Clinician: Referring Provider: Barbette Reichmann Treating Provider/Extender: Linwood Dibbles, HOYT Weeks in Treatment: 3 Information Obtained From Patient Constitutional Symptoms (General Health) Complaints and Symptoms: Negative for: Fatigue; Fever; Chills; Marked Weight Change Respiratory Complaints and Symptoms: Negative for: Chronic or frequent coughs; Shortness of Breath Medical History: Negative for: Aspiration; Asthma; Chronic Obstructive Pulmonary Disease (COPD); Pneumothorax; Sleep Apnea; Tuberculosis Cardiovascular Complaints and Symptoms: Positive for: LE edema Negative for: Chest pain Medical History: Positive for: Arrhythmia - A Fib, Tachycardia, palpitations; Hypertension; Peripheral Venous Disease Psychiatric Complaints and Symptoms: Negative for: Anxiety; Claustrophobia Eyes Medical History: Negative for: Cataracts; Glaucoma; Optic Neuritis Ear/Nose/Mouth/Throat Medical History: Negative for: Chronic sinus problems/congestion; Middle ear problems Hematologic/Lymphatic Medical History: Negative for: Anemia; Hemophilia; Human Immunodeficiency Virus; Lymphedema; Sickle Cell Disease Endocrine Medical History: Negative for: Type I Diabetes; Type II Diabetes [name], [name] ([id number]) Genitourinary Medical History: Negative for: End Stage Renal Disease Integumentary (Skin) Medical History: Positive for: History of pressure wounds - Current wound 2013 Negative for: History of Burn Musculoskeletal Medical History: Negative for: Gout Past Medical History Notes: Degenerative joint disease, Degenerative arthritis. osteoporosis Oncologic Medical History: Negative for: Received Chemotherapy; Received Radiation Immunizations Pneumococcal Vaccine: Received Pneumococcal Vaccination: Yes Implantable Devices No devices added Hospitalization / Surgery History Type of Hospitalization/Surgery Abdominal hysterectomy Angiogram  12/15/12,09/07/14 Angiogram 05/2016 Angioplasty / Stenting iliac Colonoscopy 2001 Pulmonary angioplasty 05/15/2017 Family and Social History Cancer: No; Diabetes: No; Heart Disease: Yes - Father; Hereditary Spherocytosis: No; Hypertension: Yes - Father; Kidney Disease: No; Lung Disease: No; Seizures: No; Stroke: No; Thyroid Problems: No; Tuberculosis: No; Former smoker - ended on 12/02/2018; Marital Status - Widowed; Alcohol Use: Never; Drug Use: No History; Financial Concerns: No; Food, Clothing or Shelter Needs: No; Support System Lacking: No; Transportation Concerns: No Physician Affirmation I have reviewed and agree with the above information. Electronic Signature(s) Signed: 05/07/2019 4:38:37 PM By: Lenda Kelp PA-C Signed: 05/07/2019 4:49:57 PM By: Elliot Gurney, BSN, RN, CWS, Kim RN, BSN Entered By: Lenda Kelp on 05/07/2019 13:54:21 [name] ([id number]) -------------------------------------------------------------------------------- SuperBill Details Patient Name: [name] T. Date of Service: 05/07/2019 Medical Record Number: [id number] Patient Account Number: [id number] Date of Birth/Sex: 09/07/1925 (85 y.o. F) Treating RN: Huel Coventry Primary Care Provider: Barbette Reichmann Other Clinician: Referring Provider: Barbette Reichmann Treating Provider/Extender: Linwood Dibbles, HOYT Weeks in Treatment: 3 Diagnosis Coding ICD-10 Codes Code Description I73.89 Other specified peripheral vascular diseases L97.322 Non-pressure chronic ulcer of left ankle with fat layer exposed I48.20 Chronic atrial fibrillation, unspecified I10 Essential (primary) hypertension Facility Procedures CPT4 Code: 25956387 Description: 99213 - WOUND CARE VISIT-LEV 3 EST PT Modifier: Quantity: 1 Physician Procedures CPT4 Code: 5643329 Description: 51884 -  WC PHYS LEVEL 4 - EST PT ICD-10 Diagnosis Description I73.89 Other specified peripheral vascular diseases L97.322 Non-pressure chronic ulcer of left  ankle with fat layer ex I48.20 Chronic atrial fibrillation, unspecified I10  Essential (primary) hypertension Modifier: posed Quantity: 1 Electronic Signature(s) Signed: 05/07/2019 4:38:37 PM By: Lenda Kelp PA-C Entered By: Lenda Kelp on 05/07/2019 13:55:30

## 2019-05-14 ENCOUNTER — Other Ambulatory Visit: Payer: Self-pay

## 2019-05-14 ENCOUNTER — Encounter: Payer: Medicare Other | Admitting: Physician Assistant

## 2019-05-14 DIAGNOSIS — L97322 Non-pressure chronic ulcer of left ankle with fat layer exposed: Secondary | ICD-10-CM | POA: Diagnosis not present

## 2019-05-17 NOTE — Progress Notes (Signed)
Georgeann OppenheimGRAHAM, Anaelle T. ([id number]) Visit Report for 05/14/2019 Arrival Information Details Patient Name: Georgeann OppenheimGRAHAM, Noma T. Date of Service: 05/14/2019 2:15 PM Medical Record Number: [id number] Patient Account Number: [id number] Date of Birth/Sex: 04/23/1925 (85 y.o. F) Treating RN: Arnette NorrisBiell, Kristina Primary Care Rabecca Birge: Barbette ReichmannHande, Vishwanath Other Clinician: Referring Bram Hottel: Barbette ReichmannHande, Vishwanath Treating Trevonn Hallum/Extender: Linwood DibblesSTONE III, HOYT Weeks in Treatment: 4 Visit Information History Since Last Visit Added or deleted any medications: No Patient Arrived: Wheel Chair Any new allergies or adverse reactions: No Arrival Time: 14:26 Had a fall or experienced change in No Accompanied By: daughter activities of daily living that may affect Transfer Assistance: EasyPivot Patient risk of falls: Lift Signs or symptoms of abuse/neglect since last visito No Patient Identification Verified: Yes Has Dressing in Place as Prescribed: Yes Secondary Verification Process Yes Has Compression in Place as Prescribed: Yes Completed: Pain Present Now: Yes Patient Requires Transmission-Based No Precautions: Electronic Signature(s) Signed: 05/14/2019 4:12:52 PM By: Arnette NorrisBiell, Kristina Entered By: Arnette NorrisBiell, Kristina on 05/14/2019 14:32:50 Georgeann OppenheimGRAHAM, Roe T. ([id number]) -------------------------------------------------------------------------------- Lower Extremity Assessment Details Patient Name: [name], Neriah T. Date of Service: 05/14/2019 2:15 PM Medical Record Number: [id number] Patient Account Number: [id number] Date of Birth/Sex: 05/28/1925 (85 y.o. F) Treating RN: Arnette NorrisBiell, Kristina Primary Care Ori Trejos: Barbette ReichmannHande, Vishwanath Other Clinician: Referring Sugar Vanzandt: Barbette ReichmannHande, Vishwanath Treating Emilynn Srinivasan/Extender: Linwood DibblesSTONE III, HOYT Weeks in Treatment: 4 Edema Assessment Assessed: [Left: No] [Right: No] [Left: Edema] [Right: :] Calf Left: Right: Point of Measurement: cm From Medial Instep 26.6 cm cm Ankle Left:  Right: Point of Measurement: cm From Medial Instep 18 cm cm Vascular Assessment Pulses: Dorsalis Pedis Palpable: [Left:Yes] Posterior Tibial Palpable: [Left:Yes] Electronic Signature(s) Signed: 05/14/2019 4:12:52 PM By: Arnette NorrisBiell, Kristina Entered By: Arnette NorrisBiell, Kristina on 05/14/2019 14:42:20 Georgeann OppenheimGRAHAM, Terah T. ([id number]) -------------------------------------------------------------------------------- Multi Wound Chart Details Patient Name: [name], Jomaira T. Date of Service: 05/14/2019 2:15 PM Medical Record Number: [id number] Patient Account Number: [id number] Date of Birth/Sex: 06/18/1925 (85 y.o. F) Treating RN: Curtis Sitesorthy, Joanna Primary Care Lile Mccurley: Barbette ReichmannHande, Vishwanath Other Clinician: Referring Dashanae Longfield: Barbette ReichmannHande, Vishwanath Treating Tenicia Gural/Extender: Linwood DibblesSTONE III, HOYT Weeks in Treatment: 4 Vital Signs Height(in): 59 Pulse(bpm): 83 Weight(lbs): 98 Blood Pressure(mmHg): 135/72 Body Mass Index(BMI): 20 Temperature(F): Respiratory Rate 24 (breaths/min): Photos: [N/A:N/A] Wound Location: Left Malleolus - Lateral N/A N/A Wounding Event: Gradually Appeared N/A N/A Primary Etiology: Arterial Insufficiency Ulcer N/A N/A Comorbid History: Arrhythmia, Hypertension, N/A N/A Peripheral Venous Disease, History of pressure wounds Date Acquired: 01/31/2019 N/A N/A Weeks of Treatment: 4 N/A N/A Wound Status: Open N/A N/A Clustered Wound: Yes N/A N/A Clustered Quantity: 4 N/A N/A Measurements L x W x D 2.8x4.5x0.2 N/A N/A (cm) Area (cm) : 9.896 N/A N/A Volume (cm) : 1.979 N/A N/A % Reduction in Area: -112.10% N/A N/A % Reduction in Volume: -112.10% N/A N/A Classification: Full Thickness Without N/A N/A Exposed Support Structures Exudate Amount: Medium N/A N/A Exudate Type: Serous N/A N/A Exudate Color: amber N/A N/A Wound Margin: Flat and Intact N/A N/A Granulation Amount: Medium (34-66%) N/A N/A Granulation Quality: Red, Hyper-granulation N/A N/A Necrotic Amount: Medium (34-66%)  N/A N/A Exposed Structures: Fat Layer (Subcutaneous N/A N/A Tissue) Exposed: Yes Fascia: No [place], Janeka T. ([id number]) Tendon: No Muscle: No Joint: No Bone: No Epithelialization: None N/A N/A Treatment Notes Electronic Signature(s) Signed: 05/14/2019 5:04:52 PM By: Curtis Sitesorthy, Joanna Entered By: Curtis Sitesorthy, Joanna on 05/14/2019 15:11:31 Georgeann OppenheimGRAHAM, Joseph T. ([id number]) -------------------------------------------------------------------------------- Multi-Disciplinary Care Plan Details Patient Name: [name], Britain T. Date of Service: 05/14/2019 2:15 PM Medical Record Number: [id number] Patient Account Number: [id number] Date of Birth/Sex: 10/14/1925 (85  y.o. F) Treating RN: Montey Hora Primary Care Cyleigh Massaro: Tracie Harrier Other Clinician: Referring Patina Spanier: Tracie Harrier Treating Krosby Ritchie/Extender: Melburn Hake, HOYT Weeks in Treatment: 4 Active Inactive Abuse / Safety / Falls / Self Care Management Nursing Diagnoses: Potential for falls Goals: Patient will not experience any injury related to falls Date Initiated: 04/16/2019 Target Resolution Date: 07/10/2019 Goal Status: Active Interventions: Assess fall risk on admission and as needed Notes: Nutrition Nursing Diagnoses: Potential for alteratiion in Nutrition/Potential for imbalanced nutrition Goals: Patient/caregiver agrees to and verbalizes understanding of need to use nutritional supplements and/or vitamins as prescribed Date Initiated: 04/16/2019 Target Resolution Date: 07/10/2019 Goal Status: Active Interventions: Assess patient nutrition upon admission and as needed per policy Notes: Orientation to the Wound Care Program Nursing Diagnoses: Knowledge deficit related to the wound healing center program Goals: Patient/caregiver will verbalize understanding of the [facility] Date Initiated: 04/16/2019 Target Resolution Date: 07/10/2019 Goal Status: Active Interventions: Provide education on  orientation to the wound center [place], Colvin Caroli T. ([id number]) Notes: Wound/Skin Impairment Nursing Diagnoses: Impaired tissue integrity Goals: Ulcer/skin breakdown will heal within 14 weeks Date Initiated: 04/16/2019 Target Resolution Date: 07/10/2019 Goal Status: Active Interventions: Assess patient/caregiver ability to obtain necessary supplies Assess patient/caregiver ability to perform ulcer/skin care regimen upon admission and as needed Assess ulceration(s) every visit Notes: Electronic Signature(s) Signed: 05/14/2019 5:04:52 PM By: Montey Hora Entered By: Montey Hora on 05/14/2019 15:11:22 Newton Pigg ([id number]) -------------------------------------------------------------------------------- Pain Assessment Details Patient Name: [name] Date of Service: 05/14/2019 2:15 PM Medical Record Number: [id number] Patient Account Number: [id number] Date of Birth/Sex: Apr 06, 1925 (85 y.o. F) Treating RN: Harold Barban Primary Care Jeff Mccallum: Tracie Harrier Other Clinician: Referring Tesla Bochicchio: Tracie Harrier Treating Cherice Glennie/Extender: Melburn Hake, HOYT Weeks in Treatment: 4 Active Problems Location of Pain Severity and Description of Pain Patient Has Paino Yes Site Locations Rate the pain. Current Pain Level: 6 Pain Management and Medication Current Pain Management: Electronic Signature(s) Signed: 05/14/2019 4:12:52 PM By: Harold Barban Entered By: Harold Barban on 05/14/2019 14:35:07 Newton Pigg ([id number]) -------------------------------------------------------------------------------- Patient/Caregiver Education Details Patient Name: [name] Date of Service: 05/14/2019 2:15 PM Medical Record Number: [id number] Patient Account Number: [id number] Date of Birth/Gender: [birth year] (85 y.o. F) Treating RN: Montey Hora Primary Care Physician: Tracie Harrier Other Clinician: Referring Physician: Tracie Harrier Treating  Physician/Extender: Sharalyn Ink in Treatment: 4 Education Assessment Education Provided To: Caregiver Education Topics Provided Wound/Skin Impairment: Handouts: Other: wound care as ordered Methods: Demonstration, Explain/Verbal Responses: State content correctly Electronic Signature(s) Signed: 05/14/2019 5:04:52 PM By: Montey Hora Entered By: Montey Hora on 05/14/2019 15:36:35 Newton Pigg ([id number]) -------------------------------------------------------------------------------- Wound Assessment Details Patient Name: [name] Date of Service: 05/14/2019 2:15 PM Medical Record Number: [id number] Patient Account Number: [id number] Date of Birth/Sex: 1925-01-30 (85 y.o. F) Treating RN: Harold Barban Primary Care Elsye Mccollister: Tracie Harrier Other Clinician: Referring Bellanie Matthew: Tracie Harrier Treating Giavonna Pflum/Extender: Melburn Hake, HOYT Weeks in Treatment: 4 Wound Status Wound Number: 1 Primary Arterial Insufficiency Ulcer Etiology: Wound Location: Left Malleolus - Lateral Wound Open Wounding Event: Gradually Appeared Status: Date Acquired: 01/31/2019 Comorbid Arrhythmia, Hypertension, Peripheral Venous Weeks Of Treatment: 4 History: Disease, History of pressure wounds Clustered Wound: Yes Photos Wound Measurements Length: (cm) 2.8 % Reduct Width: (cm) 4.5 % Reduct Depth: (cm) 0.2 Epitheli Clustered Quantity: 4 Tunnelin Area: (cm) 9.896 Undermi Volume: (cm) 1.979 ion in Area: -112.1% ion in Volume: -112.1% alization: None g: No ning: No Wound Description Full Thickness Without Exposed Support Foul Odo Classification: Structures  Slough/F Wound Margin: Flat and Intact Exudate Medium Amount: Exudate Type: Serous Exudate Color: amber r After Cleansing: No ibrino Yes Wound Bed Granulation Amount: Medium (34-66%) Exposed Structure Granulation Quality: Red, Hyper-granulation Fascia Exposed: No Necrotic Amount: Medium  (34-66%) Fat Layer (Subcutaneous Tissue) Exposed: Yes Necrotic Quality: Adherent Slough Tendon Exposed: No Muscle Exposed: No Joint Exposed: No Bone Exposed: No Vowels, Mattye T. ([id number]) Treatment Notes Wound #1 (Left, Lateral Malleolus) Notes zinc to peri wound, silvercel, xtrasorb, conform and tubigrip Electronic Signature(s) Signed: 05/14/2019 4:12:52 PM By: Arnette NorrisBiell, Kristina Entered By: Arnette NorrisBiell, Kristina on 05/14/2019 14:41:54 Georgeann OppenheimGRAHAM, Elenora T. ([id number]) -------------------------------------------------------------------------------- Vitals Details Patient Name: [name], Gazelle T. Date of Service: 05/14/2019 2:15 PM Medical Record Number: [id number] Patient Account Number: [id number] Date of Birth/Sex: 12/08/1924 (85 y.o. F) Treating RN: Arnette NorrisBiell, Kristina Primary Care Darean Rote: Barbette ReichmannHande, Vishwanath Other Clinician: Referring Kaela Beitz: Barbette ReichmannHande, Vishwanath Treating Raad Clayson/Extender: Linwood DibblesSTONE III, HOYT Weeks in Treatment: 4 Vital Signs Time Taken: 14:30 Pulse (bpm): 83 Height (in): 59 Respiratory Rate (breaths/min): 24 Weight (lbs): 98 Blood Pressure (mmHg): 135/72 Body Mass Index (BMI): 19.8 Reference Range: 80 - 120 mg / dl Electronic Signature(s) Signed: 05/14/2019 4:12:52 PM By: Arnette NorrisBiell, Kristina Entered By: Arnette NorrisBiell, Kristina on 05/14/2019 14:35:25

## 2019-05-17 NOTE — Progress Notes (Signed)
[name], [name] ([id number]) Visit Report for 05/14/2019 Chief Complaint Document Details Patient Name: [name], [name]. Date of Service: 05/14/2019 2:15 PM Medical Record Number: [id number] Patient Account Number: [id number] Date of Birth/Sex: Feb 10, 1925 (85 y.o. F) Treating RN: Curtis Sites Primary Care Provider: Barbette Reichmann Other Clinician: Referring Provider: Barbette Reichmann Treating Provider/Extender: Linwood Dibbles, HOYT Weeks in Treatment: 4 Information Obtained from: Patient Chief Complaint Left lateral ankle ulcer Electronic Signature(s) Signed: 05/17/2019 12:02:24 PM By: Lenda Kelp PA-C Entered By: Lenda Kelp on 05/14/2019 14:41:12 [name] ([id number]) -------------------------------------------------------------------------------- Debridement Details Patient Name: Danielle Coco T. Date of Service: 05/14/2019 2:15 PM Medical Record Number: [id number] Patient Account Number: [id number] Date of Birth/Sex: [birth year] (85 y.o. F) Treating RN: Curtis Sites Primary Care Provider: Barbette Reichmann Other Clinician: Referring Provider: Barbette Reichmann Treating Provider/Extender: Linwood Dibbles, HOYT Weeks in Treatment: 4 Debridement Performed for Wound #1 Left,Lateral Malleolus Assessment: Performed By: Physician STONE III, HOYT E., PA-C Debridement Type: Debridement Severity of Tissue Pre Fat layer exposed Debridement: Level of Consciousness (Pre- Awake and Alert procedure): Pre-procedure Verification/Time Yes - 15:13 Out Taken: Start Time: 15:13 Pain Control: Lidocaine 4% Topical Solution Total Area Debrided (L x W): 2.8 (cm) x 4.5 (cm) = 12.6 (cm) Tissue and other material Viable, Non-Viable, Slough, Subcutaneous, Slough debrided: Level: Skin/Subcutaneous Tissue Debridement Description: Excisional Instrument: Curette Bleeding: Minimum Hemostasis Achieved: Pressure End Time: 15:15 Procedural Pain: 0 Post Procedural Pain: 0 Response  to Treatment: Procedure was tolerated well Level of Consciousness Awake and Alert (Post-procedure): Post Debridement Measurements of Total Wound Length: (cm) 2.8 Width: (cm) 4.5 Depth: (cm) 0.3 Volume: (cm) 2.969 Character of Wound/Ulcer Post Debridement: Improved Severity of Tissue Post Debridement: Fat layer exposed Post Procedure Diagnosis Same as Pre-procedure Electronic Signature(s) Signed: 05/14/2019 5:04:52 PM By: Curtis Sites Signed: 05/17/2019 12:02:24 PM By: Lenda Kelp PA-C Entered By: Curtis Sites on 05/14/2019 15:14:35 [name] ([id number]) -------------------------------------------------------------------------------- HPI Details Patient Name: Danielle Coco T. Date of Service: 05/14/2019 2:15 PM Medical Record Number: [id number] Patient Account Number: [id number] Date of Birth/Sex: 04-Sep-1925 (85 y.o. F) Treating RN: Curtis Sites Primary Care Provider: Barbette Reichmann Other Clinician: Referring Provider: Barbette Reichmann Treating Provider/Extender: Linwood Dibbles, HOYT Weeks in Treatment: 4 History of Present Illness HPI Description: 04/16/19 patient presents today for initial evaluation our clinic concerning an issue with her left lateral ankle which has been present for roughly 7 years. She intermittently has had issues with infection unfortunately. She's also been on IV antibiotics back in August/September 2019. She has not had a positive test for osteomyelitis up to this point in her last MRI which was February 2019 was negative. She has had private work on her lower extremities having had a fem-pop bypass of the lower extremity on the left. She subsequently has had angioplasty as well as stenting of this extremity. Nonetheless overall it appears that she's had quite a bit of extensive work. Currently this healed in January 2020 when she was being seen at the [facility] wound care center. Subsequently it unfortunately reopened she tells me in March and  the patient's daughter who was present during the evaluation concurs. The patient is a previous smoker she is on Plavix currently. She has no history of diabetes. We were unable to obtain an ABI due to what appears to be poor flowing to left lower extremity currently. No fevers, chills, nausea, or vomiting noted at this time. 04/30/19 on evaluation today patient actually appears to be doing fairly well in regard to her  ulcer in my pinion. In fact I feel like she's showing some signs of improvement at this time which is excellent news despite the fact that were still waiting the vascular appointment. That's in just one weeks time. She has the appointment vascular on June 3 which is next Wednesday. Overall I feel like that's there's no signs of active infection at this time which is good news. I did review her culture which showed positive for Pseudomonas but again I feel like this is likely secondary to just normal skin contamination especially considering the fact that her wound is doing so well currently. Therefore I'm not going to initiate any different therapy at this point with regard to the culture results if anything changes then I will keep this in mind. 05/07/19 on evaluation today patient appears to be doing well in regard to her left lateral ankle ulcer. She is having a lot of drainage she did see her vascular surgeon and fortunately everything seems to be doing okay in that regard. I do not have that note for review yet we look to not they but unfortunately the note is not present for review as of yet. Nonetheless the patient's daughter is patient with her during the visit today and states that they were advised by the physician and vascular that there really was not anything to intervene with at this point and that all of the testing looks okay and does not appear to change from the prior testing. This is good news and hopefully means that the patient will have no difficulty healing with  the appropriate dressing changes and interventions. Fortunately there's no signs of active infection at this time. 05/14/19 on evaluation today patient actually appears to be showing some signs of improvement at this point which is good news. Fortunately there's no signs of active infection at this time. She's been tolerating the dressing changes without complication including the Tubigrip. Overall very pleased with the progress. Electronic Signature(s) Signed: 05/17/2019 12:02:24 PM By: Lenda KelpStone III, Hoyt PA-C Entered By: Lenda KelpStone III, Hoyt on 05/14/2019 15:25:54 Danielle OppenheimGRAHAM, [name] ([id number]) -------------------------------------------------------------------------------- Physical Exam Details Patient Name: Danielle CocoGRAHAM, [name] Date of Service: 05/14/2019 2:15 PM Medical Record Number: [id number] Patient Account Number: [id number] Date of Birth/Sex: 02/15/1925 (85 y.o. F) Treating RN: Curtis Sitesorthy, Joanna Primary Care Provider: Barbette ReichmannHande, Vishwanath Other Clinician: Referring Provider: Barbette ReichmannHande, Vishwanath Treating Provider/Extender: STONE III, HOYT Weeks in Treatment: 4 Constitutional Well-nourished and well-hydrated in no acute distress. Respiratory normal breathing without difficulty. clear to auscultation bilaterally. Cardiovascular regular rate and rhythm with normal S1, S2. Psychiatric this patient is able to make decisions and demonstrates good insight into disease process. Alert and Oriented x 3. pleasant and cooperative. Notes Patient's wound bed currently showed signs of some improvement that was [facility] noted on the surface of the wound is did require sharp debridement today. Post debridement wound bed appears to be doing much better which is excellent news. Electronic Signature(s) Signed: 05/17/2019 12:02:24 PM By: Lenda KelpStone III, Hoyt PA-C Entered By: Lenda KelpStone III, Hoyt on 05/14/2019 15:26:19 Danielle OppenheimGRAHAM, [name]  ([id number]) -------------------------------------------------------------------------------- Physician Orders Details Patient Name: Danielle CocoGRAHAM, [name] Date of Service: 05/14/2019 2:15 PM Medical Record Number: [id number] Patient Account Number: [id number] Date of Birth/Sex: 03/23/1925 (85 y.o. F) Treating RN: Curtis Sitesorthy, Joanna Primary Care Provider: Barbette ReichmannHande, Vishwanath Other Clinician: Referring Provider: Barbette ReichmannHande, Vishwanath Treating Provider/Extender: Linwood DibblesSTONE III, HOYT Weeks in Treatment: 4 Verbal / Phone Orders: No Diagnosis Coding ICD-10 Coding Code Description I73.89 Other specified peripheral vascular diseases L97.322 Non-pressure chronic ulcer of  left ankle with fat layer exposed I48.20 Chronic atrial fibrillation, unspecified I10 Essential (primary) hypertension Wound Cleansing Wound #1 Left,Lateral Malleolus o Clean wound with Normal Saline. Anesthetic (add to Medication List) Wound #1 Left,Lateral Malleolus o Topical Lidocaine 4% cream applied to wound bed prior to debridement (In Clinic Only). Skin Barriers/Peri-Wound Care Wound #1 Left,Lateral Malleolus o Barrier cream - to peri wound Primary Wound Dressing Wound #1 Left,Lateral Malleolus o Silver Alginate - PLEASE ORDER SILVERCEL NON ADHERENT FOR PATIENT Secondary Dressing Wound #1 Left,Lateral Malleolus o Conform/Kerlix o XtraSorb - PLEASE PROVIDE XTRASRB FOR PATIENT R/T MACERATION Dressing Change Frequency Wound #1 Left,Lateral Malleolus o Change Dressing Monday, Wednesday, Friday Follow-up Appointments Wound #1 Left,Lateral Malleolus o Return Appointment in 1 week. Edema Control Wound #1 Left,Lateral Malleolus o Other: - SAMIA, KUKLA ([id number]) Home Health Wound #1 Left,Lateral Malleolus o Continue Home Health Visits - [facility] o Home Health Nurse may visit PRN to address patientos wound care needs. o FACE TO FACE ENCOUNTER: MEDICARE and MEDICAID PATIENTS: I certify that  this patient is under my care and that I had a face-to-face encounter that meets the physician face-to-face encounter requirements with this patient on this date. The encounter with the patient was in whole or in part for the following MEDICAL CONDITION: (primary reason for Home Healthcare) MEDICAL NECESSITY: I certify, that based on my findings, NURSING services are a medically necessary home health service. HOME BOUND STATUS: I certify that my clinical findings support that this patient is homebound (i.e., Due to illness or injury, pt requires aid of supportive devices such as crutches, cane, wheelchairs, walkers, the use of special transportation or the assistance of another person to leave their place of residence. There is a normal inability to leave the home and doing so requires considerable and taxing effort. Other absences are for medical reasons / religious services and are infrequent or of short duration when for other reasons). o If current dressing causes regression in wound condition, may D/C ordered dressing product/s and apply Normal Saline Moist Dressing daily until next Wound Healing Center / Other MD appointment. Notify Wound Healing Center of regression in wound condition at [phone]. o Please direct any NON-WOUND related issues/requests for orders to patient's Primary Care Physician Electronic Signature(s) Signed: 05/14/2019 5:04:52 PM By: Curtis Sites Signed: 05/17/2019 12:02:24 PM By: Lenda Kelp PA-C Entered By: Curtis Sites on 05/14/2019 15:14:57 [name] ([id number]) -------------------------------------------------------------------------------- Problem List Details Patient Name: Danielle Coco T. Date of Service: 05/14/2019 2:15 PM Medical Record Number: [id number] Patient Account Number: [id number] Date of Birth/Sex: 01-Mar-1925 (85 y.o. F) Treating RN: Curtis Sites Primary Care Provider: Barbette Reichmann Other Clinician: Referring  Provider: Barbette Reichmann Treating Provider/Extender: Linwood Dibbles, HOYT Weeks in Treatment: 4 Active Problems ICD-10 Evaluated Encounter Code Description Active Date Today Diagnosis I73.89 Other specified peripheral vascular diseases 04/16/2019 No Yes L97.322 Non-pressure chronic ulcer of left ankle with fat layer 04/16/2019 No Yes exposed I48.20 Chronic atrial fibrillation, unspecified 04/16/2019 No Yes I10 Essential (primary) hypertension 04/16/2019 No Yes Inactive Problems Resolved Problems Electronic Signature(s) Signed: 05/17/2019 12:02:24 PM By: Lenda Kelp PA-C Entered By: Lenda Kelp on 05/14/2019 14:41:06 [name] ([id number]) -------------------------------------------------------------------------------- Progress Note Details Patient Name: Danielle Coco T. Date of Service: 05/14/2019 2:15 PM Medical Record Number: [id number] Patient Account Number: [id number] Date of Birth/Sex: 1925-04-15 (85 y.o. F) Treating RN: Curtis Sites Primary Care Provider: Barbette Reichmann Other Clinician: Referring Provider: Barbette Reichmann Treating Provider/Extender: Linwood Dibbles, HOYT Weeks in Treatment:  4 Subjective Chief Complaint Information obtained from Patient Left lateral ankle ulcer History of Present Illness (HPI) 04/16/19 patient presents today for initial evaluation our clinic concerning an issue with her left lateral ankle which has been present for roughly 7 years. She intermittently has had issues with infection unfortunately. She's also been on IV antibiotics back in August/September 2019. She has not had a positive test for osteomyelitis up to this point in her last MRI which was February 2019 was negative. She has had private work on her lower extremities having had a fem-pop bypass of the lower extremity on the left. She subsequently has had angioplasty as well as stenting of this extremity. Nonetheless overall it appears that she's had quite a bit of  extensive work. Currently this healed in January 2020 when [facility]. Subsequently it unfortunately reopened she tells me in March and the patient's daughter who was present during the evaluation concurs. The patient is a previous smoker she is on Plavix currently. She has no history of diabetes. We were unable to obtain an ABI due to what appears to be poor flowing to left lower extremity currently. No fevers, chills, nausea, or vomiting noted at this time. 04/30/19 on evaluation today patient actually appears to be doing fairly well in regard to her ulcer in my pinion. In fact I feel like she's showing some signs of improvement at this time which is excellent news despite the fact that were still waiting the vascular appointment. That's in just one weeks time. She has the appointment vascular on June 3 which is next Wednesday. Overall I feel like that's there's no signs of active infection at this time which is good news. I did review her culture which showed positive for Pseudomonas but again I feel like this is likely secondary to just normal skin contamination especially considering the fact that her wound is doing so well currently. Therefore I'm not going to initiate any different therapy at this point with regard to the culture results if anything changes then I will keep this in mind. 05/07/19 on evaluation today patient appears to be doing well in regard to her left lateral ankle ulcer. She is having a lot of drainage she did see her vascular surgeon and fortunately everything seems to be doing okay in that regard. I do not have that note for review yet we look to not they but unfortunately the note is not present for review as of yet. Nonetheless the patient's daughter is patient with her during the visit today and states that they were advised by the physician and vascular that there really was not anything to intervene with at this point and that all of  the testing looks okay and does not appear to change from the prior testing. This is good news and hopefully means that the patient will have no difficulty healing with the appropriate dressing changes and interventions. Fortunately there's no signs of active infection at this time. 05/14/19 on evaluation today patient actually appears to be showing some signs of improvement at this point which is good news. Fortunately there's no signs of active infection at this time. She's been tolerating the dressing changes without complication including the Tubigrip. Overall very pleased with the progress. Patient History Information obtained from Patient. Family History Heart Disease - Father, Hypertension - Father, No family history of Cancer, Diabetes, Hereditary Spherocytosis, Kidney Disease, Lung Disease, Seizures, Stroke, Thyroid Problems, Tuberculosis. Social History Lefferts, Jule SerGENELLE  T. ([id number]) Former smoker - ended on 12/02/2018, Marital Status - Widowed, Alcohol Use - Never, Drug Use - No History. Medical History Eyes Denies history of Cataracts, Glaucoma, Optic Neuritis Ear/Nose/Mouth/Throat Denies history of Chronic sinus problems/congestion, Middle ear problems Hematologic/Lymphatic Denies history of Anemia, Hemophilia, Human Immunodeficiency Virus, Lymphedema, Sickle Cell Disease Respiratory Denies history of Aspiration, Asthma, Chronic Obstructive Pulmonary Disease (COPD), Pneumothorax, Sleep Apnea, Tuberculosis Cardiovascular Patient has history of Arrhythmia - A Fib, Tachycardia, palpitations, Hypertension, Peripheral Venous Disease Endocrine Denies history of Type I Diabetes, Type II Diabetes Genitourinary Denies history of End Stage Renal Disease Integumentary (Skin) Patient has history of History of pressure wounds - Current wound 2013 Denies history of History of Burn Musculoskeletal Denies history of Gout Oncologic Denies history of Received Chemotherapy, Received  Radiation Hospitalization/Surgery History - Abdominal hysterectomy. - Angiogram 12/15/12,09/07/14. - Angiogram 05/2016. - Angioplasty / Stenting iliac. - Colonoscopy 2001. - Pulmonary angioplasty 05/15/2017. Medical And Surgical History Notes Musculoskeletal Degenerative joint disease, Degenerative arthritis. osteoporosis Review of Systems (ROS) Constitutional Symptoms (General Health) Denies complaints or symptoms of Fatigue, Fever, Chills, Marked Weight Change. Respiratory Denies complaints or symptoms of Chronic or frequent coughs, Shortness of Breath. Cardiovascular Denies complaints or symptoms of Chest pain, LE edema. Psychiatric Denies complaints or symptoms of Anxiety, Claustrophobia. Objective Constitutional Well-nourished and well-hydrated in no acute distress. Vitals Time Taken: 2:30 PM, Height: 59 in, Weight: 98 lbs, BMI: 19.8, Pulse: 83 bpm, Respiratory Rate: 24 breaths/min, Blood Pressure: 135/72 mmHg. [name], [name] ([id number]) Respiratory normal breathing without difficulty. clear to auscultation bilaterally. Cardiovascular regular rate and rhythm with normal S1, S2. Psychiatric this patient is able to make decisions and demonstrates good insight into disease process. Alert and Oriented x 3. pleasant and cooperative. General Notes: Patient's wound bed currently showed signs of some improvement that was [facility] noted on the surface of the wound is did require sharp debridement today. Post debridement wound bed appears to be doing much better which is excellent news. Integumentary (Hair, Skin) Wound #1 status is Open. Original cause of wound was Gradually Appeared. The wound is located on the Left,Lateral Malleolus. The wound measures 2.8cm length x 4.5cm width x 0.2cm depth; 9.896cm^2 area and 1.979cm^3 volume. There is Fat Layer (Subcutaneous Tissue) Exposed exposed. There is no tunneling or undermining noted. There is a medium amount of serous drainage noted. The  wound margin is flat and intact. There is medium (34-66%) red, hyper - granulation within the wound bed. There is a medium (34-66%) amount of necrotic tissue within the wound bed including Adherent Slough. Assessment Active Problems ICD-10 Other specified peripheral vascular diseases Non-pressure chronic ulcer of left ankle with fat layer exposed Chronic atrial fibrillation, unspecified Essential (primary) hypertension Procedures Wound #1 Pre-procedure diagnosis of Wound #1 is an Arterial Insufficiency Ulcer located on the Left,Lateral Malleolus .Severity of Tissue Pre Debridement is: Fat layer exposed. There was a Excisional Skin/Subcutaneous Tissue Debridement with a total area of 12.6 sq cm performed by STONE III, HOYT E., PA-C. With the following instrument(s): Curette to remove Viable and Non-Viable tissue/material. Material removed includes Subcutaneous Tissue and Slough and after achieving pain control using Lidocaine 4% Topical Solution. No specimens were taken. A time out was conducted at 15:13, prior to the start of the procedure. A Minimum amount of bleeding was controlled with Pressure. The procedure was tolerated well with a pain level of 0 throughout and a pain level of 0 following the procedure. Post Debridement Measurements: 2.8cm length x 4.5cm width x 0.3cm  depth; 2.969cm^3 volume. Character of Wound/Ulcer Post Debridement is improved. Severity of Tissue Post Debridement is: Fat layer exposed. Post procedure Diagnosis Wound #1: Same as Pre-Procedure Danielle CocoGRAHAM, [name] ([id number]) Plan Wound Cleansing: Wound #1 Left,Lateral Malleolus: Clean wound with Normal Saline. Anesthetic (add to Medication List): Wound #1 Left,Lateral Malleolus: Topical Lidocaine 4% cream applied to wound bed prior to debridement (In Clinic Only). Skin Barriers/Peri-Wound Care: Wound #1 Left,Lateral Malleolus: Barrier cream - to peri wound Primary Wound Dressing: Wound #1 Left,Lateral  Malleolus: Silver Alginate - PLEASE ORDER SILVERCEL NON ADHERENT FOR PATIENT Secondary Dressing: Wound #1 Left,Lateral Malleolus: Conform/Kerlix XtraSorb - PLEASE PROVIDE XTRASRB FOR PATIENT R/T MACERATION Dressing Change Frequency: Wound #1 Left,Lateral Malleolus: Change Dressing Monday, Wednesday, Friday Follow-up Appointments: Wound #1 Left,Lateral Malleolus: Return Appointment in 1 week. Edema Control: Wound #1 Left,Lateral Malleolus: Other: - TubiGrip E Home Health: Wound #1 Left,Lateral Malleolus: Continue Home Health Visits - [facility] Home Health Nurse may visit PRN to address patient s wound care needs. FACE TO FACE ENCOUNTER: MEDICARE and MEDICAID PATIENTS: I certify that this patient is under my care and that I had a face-to-face encounter that meets the physician face-to-face encounter requirements with this patient on this date. The encounter with the patient was in whole or in part for the following MEDICAL CONDITION: (primary reason for Home Healthcare) MEDICAL NECESSITY: I certify, that based on my findings, NURSING services are a medically necessary home health service. HOME BOUND STATUS: I certify that my clinical findings support that this patient is homebound (i.e., Due to illness or injury, pt requires aid of supportive devices such as crutches, cane, wheelchairs, walkers, the use of special transportation or the assistance of another person to leave their place of residence. There is a normal inability to leave the home and doing so requires considerable and taxing effort. Other absences are for medical reasons / religious services and are infrequent or of short duration when for other reasons). If current dressing causes regression in wound condition, may D/C ordered dressing product/s and apply Normal Saline Moist Dressing daily until next Wound Healing Center / Other MD appointment. Notify Wound Healing Center of regression in wound condition at  [phone]. Please direct any NON-WOUND related issues/requests for orders to patient's Primary Care Physician I'm gonna suggest that we continue with the above wound care measures for the next week and the patient is in agreement with plan. We will subsequently see were things stand at follow-up. If anything changes or worsens meantime she will contact the office and let me know. Otherwise my hope is that she will continue to improve over the next several weeks. Please see above for specific wound care orders. We will see patient for re-evaluation in 1 week(s) here in the clinic. If anything worsens or changes patient will contact our office for additional recommendations. Danielle OppenheimGRAHAM, [name] ([id number]) Electronic Signature(s) Signed: 05/17/2019 12:02:24 PM By: Lenda KelpStone III, Hoyt PA-C Entered By: Lenda KelpStone III, Hoyt on 05/14/2019 15:26:44 Danielle OppenheimGRAHAM, [name] ([id number]) -------------------------------------------------------------------------------- ROS/PFSH Details Patient Name: Danielle CocoGRAHAM, Namita T. Date of Service: 05/14/2019 2:15 PM Medical Record Number: [id number] Patient Account Number: [id number] Date of Birth/Sex: 04/07/1925 (85 y.o. F) Treating RN: Curtis Sitesorthy, Joanna Primary Care Provider: Barbette ReichmannHande, Vishwanath Other Clinician: Referring Provider: Barbette ReichmannHande, Vishwanath Treating Provider/Extender: Linwood DibblesSTONE III, HOYT Weeks in Treatment: 4 Information Obtained From Patient Constitutional Symptoms (General Health) Complaints and Symptoms: Negative for: Fatigue; Fever; Chills; Marked Weight Change Respiratory Complaints and Symptoms: Negative for: Chronic or frequent coughs; Shortness of Breath Medical History: Negative  for: Aspiration; Asthma; Chronic Obstructive Pulmonary Disease (COPD); Pneumothorax; Sleep Apnea; Tuberculosis Cardiovascular Complaints and Symptoms: Negative for: Chest pain; LE edema Medical History: Positive for: Arrhythmia - A Fib, Tachycardia, palpitations; Hypertension;  Peripheral Venous Disease Psychiatric Complaints and Symptoms: Negative for: Anxiety; Claustrophobia Eyes Medical History: Negative for: Cataracts; Glaucoma; Optic Neuritis Ear/Nose/Mouth/Throat Medical History: Negative for: Chronic sinus problems/congestion; Middle ear problems Hematologic/Lymphatic Medical History: Negative for: Anemia; Hemophilia; Human Immunodeficiency Virus; Lymphedema; Sickle Cell Disease Endocrine Medical History: Negative for: Type I Diabetes; Type II Diabetes [name], [name] ([id number]) Genitourinary Medical History: Negative for: End Stage Renal Disease Integumentary (Skin) Medical History: Positive for: History of pressure wounds - Current wound 2013 Negative for: History of Burn Musculoskeletal Medical History: Negative for: Gout Past Medical History Notes: Degenerative joint disease, Degenerative arthritis. osteoporosis Oncologic Medical History: Negative for: Received Chemotherapy; Received Radiation Immunizations Pneumococcal Vaccine: Received Pneumococcal Vaccination: Yes Implantable Devices No devices added Hospitalization / Surgery History Type of Hospitalization/Surgery Abdominal hysterectomy Angiogram 12/15/12,09/07/14 Angiogram 05/2016 Angioplasty / Stenting iliac Colonoscopy 2001 Pulmonary angioplasty 05/15/2017 Family and Social History Cancer: No; Diabetes: No; Heart Disease: Yes - Father; Hereditary Spherocytosis: No; Hypertension: Yes - Father; Kidney Disease: No; Lung Disease: No; Seizures: No; Stroke: No; Thyroid Problems: No; Tuberculosis: No; Former smoker - ended on 12/02/2018; Marital Status - Widowed; Alcohol Use: Never; Drug Use: No History; Financial Concerns: No; Food, Clothing or Shelter Needs: No; Support System Lacking: No; Transportation Concerns: No Physician Affirmation I have reviewed and agree with the above information. Electronic Signature(s) Signed: 05/14/2019 5:04:52 PM By: Curtis Sites Signed:  05/17/2019 12:02:24 PM By: Lenda Kelp PA-C Entered By: Lenda Kelp on 05/14/2019 15:26:08 [name] ([id number]) -------------------------------------------------------------------------------- SuperBill Details Patient Name: Danielle Coco T. Date of Service: 05/14/2019 Medical Record Number: [id number] Patient Account Number: [id number] Date of Birth/Sex: September 30, 1925 (85 y.o. F) Treating RN: Curtis Sites Primary Care Provider: Barbette Reichmann Other Clinician: Referring Provider: Barbette Reichmann Treating Provider/Extender: Linwood Dibbles, HOYT Weeks in Treatment: 4 Diagnosis Coding ICD-10 Codes Code Description I73.89 Other specified peripheral vascular diseases L97.322 Non-pressure chronic ulcer of left ankle with fat layer exposed I48.20 Chronic atrial fibrillation, unspecified I10 Essential (primary) hypertension Facility Procedures CPT4 Code: 08657846 Description: 11042 - DEB SUBQ TISSUE 20 SQ CM/< ICD-10 Diagnosis Description L97.322 Non-pressure chronic ulcer of left ankle with fat layer exp Modifier: osed Quantity: 1 Physician Procedures CPT4 Code: 9629528 Description: 11042 - WC PHYS SUBQ TISS 20 SQ CM ICD-10 Diagnosis Description L97.322 Non-pressure chronic ulcer of left ankle with fat layer exp Modifier: osed Quantity: 1 Electronic Signature(s) Signed: 05/17/2019 12:02:24 PM By: Lenda Kelp PA-C Entered By: Lenda Kelp on 05/14/2019 15:26:50

## 2019-05-21 ENCOUNTER — Encounter: Payer: Medicare Other | Admitting: Physician Assistant

## 2019-05-21 ENCOUNTER — Other Ambulatory Visit: Payer: Self-pay

## 2019-05-21 DIAGNOSIS — L97322 Non-pressure chronic ulcer of left ankle with fat layer exposed: Secondary | ICD-10-CM | POA: Diagnosis not present

## 2019-05-22 NOTE — Progress Notes (Signed)
Danielle, Good ([id number]) Visit Report for 05/21/2019 Arrival Information Details Patient Name: [name], [name]. Date of Service: 05/21/2019 2:00 PM Medical Record Number: [id number] Patient Account Number: [id number] Date of Birth/Sex: 03-Sep-1925 (85 y.o. F) Treating RN: Army Melia Primary Care Ayanah Snader: Tracie Harrier Other Clinician: Referring Brantlee Penn: Tracie Harrier Treating Nakeita Styles/Extender: Melburn Hake, HOYT Weeks in Treatment: 5 Visit Information History Since Last Visit Added or deleted any medications: No Patient Arrived: Walker Any new allergies or adverse reactions: No Arrival Time: 14:14 Had a fall or experienced change in No Accompanied By: daughter activities of daily living that may affect Transfer Assistance: None risk of falls: Patient Requires Transmission-Based Precautions: No Signs or symptoms of abuse/neglect since last visito No Hospitalized since last visit: No Has Dressing in Place as Prescribed: Yes Pain Present Now: No Electronic Signature(s) Signed: 05/21/2019 4:22:16 PM By: Army Melia Entered By: Army Melia on 05/21/2019 14:14:55 Newton Pigg ([id number]) -------------------------------------------------------------------------------- Clinic Level of Care Assessment Details Patient Name: Danielle Charters T. Date of Service: 05/21/2019 2:00 PM Medical Record Number: [id number] Patient Account Number: [id number] Date of Birth/Sex: 02/22/1925 (85 y.o. F) Treating RN: Montey Hora Primary Care Meleane Selinger: Tracie Harrier Other Clinician: Referring Montrice Gracey: Tracie Harrier Treating Reni Hausner/Extender: Melburn Hake, HOYT Weeks in Treatment: 5 Clinic Level of Care Assessment Items TOOL 4 Quantity Score []  - Use when only an EandM is performed on FOLLOW-UP visit 0 ASSESSMENTS - Nursing Assessment / Reassessment X - Reassessment of Co-morbidities (includes updates in patient status) 1 10 X- 1 5 Reassessment of Adherence to Treatment  Plan ASSESSMENTS - Wound and Skin Assessment / Reassessment X - Simple Wound Assessment / Reassessment - one wound 1 5 []  - 0 Complex Wound Assessment / Reassessment - multiple wounds []  - 0 Dermatologic / Skin Assessment (not related to wound area) ASSESSMENTS - Focused Assessment []  - Circumferential Edema Measurements - multi extremities 0 []  - 0 Nutritional Assessment / Counseling / Intervention X- 1 5 Lower Extremity Assessment (monofilament, tuning fork, pulses) []  - 0 Peripheral Arterial Disease Assessment (using hand held doppler) ASSESSMENTS - Ostomy and/or Continence Assessment and Care []  - Incontinence Assessment and Management 0 []  - 0 Ostomy Care Assessment and Management (repouching, etc.) PROCESS - Coordination of Care X - Simple Patient / Family Education for ongoing care 1 15 []  - 0 Complex (extensive) Patient / Family Education for ongoing care X- 1 10 Staff obtains Programmer, systems, Records, Test Results / Process Orders []  - 0 Staff telephones HHA, Nursing Homes / Clarify orders / etc []  - 0 Routine Transfer to another Facility (non-emergent condition) []  - 0 Routine Hospital Admission (non-emergent condition) []  - 0 New Admissions / Biomedical engineer / Ordering NPWT, Apligraf, etc. []  - 0 Emergency Hospital Admission (emergent condition) X- 1 10 Simple Discharge Coordination ALEESE, KAMPS T. ([id number]) []  - 0 Complex (extensive) Discharge Coordination PROCESS - Special Needs []  - Pediatric / Minor Patient Management 0 []  - 0 Isolation Patient Management []  - 0 Hearing / Language / Visual special needs []  - 0 Assessment of Community assistance (transportation, D/C planning, etc.) []  - 0 Additional assistance / Altered mentation []  - 0 Support Surface(s) Assessment (bed, cushion, seat, etc.) INTERVENTIONS - Wound Cleansing / Measurement X - Simple Wound Cleansing - one wound 1 5 []  - 0 Complex Wound Cleansing - multiple wounds X- 1  5 Wound Imaging (photographs - any number of wounds) []  - 0 Wound Tracing (instead of photographs) X- 1 5 Simple Wound Measurement - one wound []  -  0 Complex Wound Measurement - multiple wounds INTERVENTIONS - Wound Dressings []  - Small Wound Dressing one or multiple wounds 0 X- 1 15 Medium Wound Dressing one or multiple wounds []  - 0 Large Wound Dressing one or multiple wounds []  - 0 Application of Medications - topical []  - 0 Application of Medications - injection INTERVENTIONS - Miscellaneous []  - External ear exam 0 []  - 0 Specimen Collection (cultures, biopsies, blood, body fluids, etc.) []  - 0 Specimen(s) / Culture(s) sent or taken to Lab for analysis []  - 0 Patient Transfer (multiple staff / Nurse, adultHoyer Lift / Similar devices) []  - 0 Simple Staple / Suture removal (25 or less) []  - 0 Complex Staple / Suture removal (26 or more) []  - 0 Hypo / Hyperglycemic Management (close monitor of Blood Glucose) []  - 0 Ankle / Brachial Index (ABI) - do not check if billed separately X- 1 5 Vital Signs Leventhal, Doretha T. ([id number]) Has the patient been seen at the hospital within the last three years: Yes Total Score: 95 Level Of Care: New/Established - Level 3 Electronic Signature(s) Signed: 05/21/2019 5:01:18 PM By: Curtis Sitesorthy, Joanna Entered By: Curtis Sitesorthy, Joanna on 05/21/2019 15:10:48 Georgeann OppenheimGRAHAM, Breannah T. ([id number]) -------------------------------------------------------------------------------- Encounter Discharge Information Details Patient Name: [name], [name] Date of Service: 05/21/2019 2:00 PM Medical Record Number: [id number] Patient Account Number: [id number] Date of Birth/Sex: 10/13/1925 (85 y.o. F) Treating RN: Arnette NorrisBiell, Kristina Primary Care Nayah Lukens: Barbette ReichmannHande, Vishwanath Other Clinician: Referring Kaeya Schiffer: Barbette ReichmannHande, Vishwanath Treating Bassam Dresch/Extender: Linwood DibblesSTONE III, HOYT Weeks in Treatment: 5 Encounter Discharge Information Items Discharge Condition: Stable Ambulatory  Status: Wheelchair Discharge Destination: Home Transportation: Private Auto Accompanied By: family Schedule Follow-up Appointment: Yes Clinical Summary of Care: Electronic Signature(s) Signed: 05/21/2019 4:04:00 PM By: Arnette NorrisBiell, Kristina Entered By: Arnette NorrisBiell, Kristina on 05/21/2019 15:10:10 Georgeann OppenheimGRAHAM, Bhavika T. ([id number]) -------------------------------------------------------------------------------- Lower Extremity Assessment Details Patient Name: [name], Debanhi T. Date of Service: 05/21/2019 2:00 PM Medical Record Number: [id number] Patient Account Number: [id number] Date of Birth/Sex: 05/18/1925 (85 y.o. F) Treating RN: Rodell PernaScott, Dajea Primary Care Perrin Gens: Barbette ReichmannHande, Vishwanath Other Clinician: Referring Emmalynne Courtney: Barbette ReichmannHande, Vishwanath Treating Staria Birkhead/Extender: STONE III, HOYT Weeks in Treatment: 5 Edema Assessment Assessed: [Left: No] [Right: No] Edema: [Left: N] [Right: o] Vascular Assessment Pulses: Dorsalis Pedis Palpable: [Left:Yes] Electronic Signature(s) Signed: 05/21/2019 4:22:16 PM By: Rodell PernaScott, Dajea Entered By: Rodell PernaScott, Dajea on 05/21/2019 14:22:17 Georgeann OppenheimGRAHAM, Jaimie T. ([id number]) -------------------------------------------------------------------------------- Multi Wound Chart Details Patient Name: [name], Adrieana T. Date of Service: 05/21/2019 2:00 PM Medical Record Number: [id number] Patient Account Number: [id number] Date of Birth/Sex: 08/27/1925 (85 y.o. F) Treating RN: Curtis Sitesorthy, Joanna Primary Care Colie Fugitt: Barbette ReichmannHande, Vishwanath Other Clinician: Referring Laiya Wisby: Barbette ReichmannHande, Vishwanath Treating Jamiel Goncalves/Extender: Linwood DibblesSTONE III, HOYT Weeks in Treatment: 5 Vital Signs Height(in): 59 Pulse(bpm): 70 Weight(lbs): 98 Blood Pressure(mmHg): 128/65 Body Mass Index(BMI): 20 Temperature(F): Respiratory Rate 16 (breaths/min): Photos: [1:No Photos] [N/A:N/A] Wound Location: [1:Left Malleolus - Lateral] [N/A:N/A] Wounding Event: [1:Gradually Appeared] [N/A:N/A] Primary Etiology: [1:Arterial  Insufficiency Ulcer] [N/A:N/A] Comorbid History: [1:Arrhythmia, Hypertension, Peripheral Venous Disease, History of pressure wounds] [N/A:N/A] Date Acquired: [1:01/31/2019] [N/A:N/A] Weeks of Treatment: [1:5] [N/A:N/A] Wound Status: [1:Open] [N/A:N/A] Clustered Wound: [1:Yes] [N/A:N/A] Clustered Quantity: [1:4] [N/A:N/A] Measurements L x W x D [1:3.5x2.4x0.2] [N/A:N/A] (cm) Area (cm) : [1:6.597] [N/A:N/A] Volume (cm) : [1:1.319] [N/A:N/A] % Reduction in Area: [1:-41.40%] [N/A:N/A] % Reduction in Volume: [1:-41.40%] [N/A:N/A] Classification: [1:Full Thickness Without Exposed Support Structures] [N/A:N/A] Exudate Amount: [1:Small] [N/A:N/A] Exudate Type: [1:Serous] [N/A:N/A] Exudate Color: [1:amber] [N/A:N/A] Wound Margin: [1:Flat and Intact] [N/A:N/A] Granulation Amount: [1:Medium (34-66%)] [N/A:N/A] Granulation Quality: [1:Red, Hyper-granulation] [N/A:N/A]  Necrotic Amount: [1:Medium (34-66%)] [N/A:N/A] Exposed Structures: [1:Fat Layer (Subcutaneous Tissue) Exposed: Yes Fascia: No Tendon: No Muscle: No Joint: No Bone: No None] [N/A:N/A N/A] Treatment Notes Georgeann OppenheimGRAHAM, Eden T. ([id number]) Electronic Signature(s) Signed: 05/21/2019 5:01:18 PM By: Curtis Sitesorthy, Joanna Entered By: Curtis Sitesorthy, Joanna on 05/21/2019 14:51:57 Georgeann OppenheimGRAHAM, Janaiya T. ([id number]) -------------------------------------------------------------------------------- Multi-Disciplinary Care Plan Details Patient Name: [name], Falicia T. Date of Service: 05/21/2019 2:00 PM Medical Record Number: [id number] Patient Account Number: [id number] Date of Birth/Sex: 06/09/1925 (85 y.o. F) Treating RN: Curtis Sitesorthy, Joanna Primary Care Janeice Stegall: Barbette ReichmannHande, Vishwanath Other Clinician: Referring Johnae Friley: Barbette ReichmannHande, Vishwanath Treating Kerri Asche/Extender: Linwood DibblesSTONE III, HOYT Weeks in Treatment: 5 Active Inactive Abuse / Safety / Falls / Self Care Management Nursing Diagnoses: Potential for falls Goals: Patient will not experience any injury related to  falls Date Initiated: 04/16/2019 Target Resolution Date: 07/10/2019 Goal Status: Active Interventions: Assess fall risk on admission and as needed Notes: Nutrition Nursing Diagnoses: Potential for alteratiion in Nutrition/Potential for imbalanced nutrition Goals: Patient/caregiver agrees to and verbalizes understanding of need to use nutritional supplements and/or vitamins as prescribed Date Initiated: 04/16/2019 Target Resolution Date: 07/10/2019 Goal Status: Active Interventions: Assess patient nutrition upon admission and as needed per policy Notes: Orientation to the Wound Care Program Nursing Diagnoses: Knowledge deficit related to the wound healing center program Goals: Patient/caregiver will verbalize understanding of the Wound Healing Center Program Date Initiated: 04/16/2019 Target Resolution Date: 07/10/2019 Goal Status: Active Interventions: Provide education on orientation to the wound center VeronaGRAHAM, Jule SerGENELLE T. ([id number]) Notes: Wound/Skin Impairment Nursing Diagnoses: Impaired tissue integrity Goals: Ulcer/skin breakdown will heal within 14 weeks Date Initiated: 04/16/2019 Target Resolution Date: 07/10/2019 Goal Status: Active Interventions: Assess patient/caregiver ability to obtain necessary supplies Assess patient/caregiver ability to perform ulcer/skin care regimen upon admission and as needed Assess ulceration(s) every visit Notes: Electronic Signature(s) Signed: 05/21/2019 5:01:18 PM By: Curtis Sitesorthy, Joanna Entered By: Curtis Sitesorthy, Joanna on 05/21/2019 14:51:49 Georgeann OppenheimGRAHAM, Halima T. ([id number]) -------------------------------------------------------------------------------- Pain Assessment Details Patient Name: [name], Jannetta T. Date of Service: 05/21/2019 2:00 PM Medical Record Number: [id number] Patient Account Number: [id number] Date of Birth/Sex: 07/18/1925 (85 y.o. F) Treating RN: Rodell PernaScott, Dajea Primary Care Javarion Douty: Barbette ReichmannHande, Vishwanath Other Clinician: Referring  Oswaldo Cueto: Barbette ReichmannHande, Vishwanath Treating Enez Monahan/Extender: Linwood DibblesSTONE III, HOYT Weeks in Treatment: 5 Active Problems Location of Pain Severity and Description of Pain Patient Has Paino No Site Locations Pain Management and Medication Current Pain Management: Electronic Signature(s) Signed: 05/21/2019 4:22:16 PM By: Rodell PernaScott, Dajea Entered By: Rodell PernaScott, Dajea on 05/21/2019 14:15:02 Georgeann OppenheimGRAHAM, Lalani T. ([id number]) -------------------------------------------------------------------------------- Patient/Caregiver Education Details Patient Name: [name], Clarece T. Date of Service: 05/21/2019 2:00 PM Medical Record Number: [id number] Patient Account Number: [id number] Date of Birth/Gender: 09/02/1925 (85 y.o. F) Treating RN: Curtis Sitesorthy, Joanna Primary Care Physician: Barbette ReichmannHande, Vishwanath Other Clinician: Referring Physician: Barbette ReichmannHande, Vishwanath Treating Physician/Extender: Skeet SimmerSTONE III, HOYT Weeks in Treatment: 5 Education Assessment Education Provided To: Patient and Caregiver Education Topics Provided Wound/Skin Impairment: Handouts: Other: wound care as ordered Methods: Demonstration, Explain/Verbal Responses: State content correctly Electronic Signature(s) Signed: 05/21/2019 5:01:18 PM By: Curtis Sitesorthy, Joanna Entered By: Curtis Sitesorthy, Joanna on 05/21/2019 15:30:22 Georgeann OppenheimGRAHAM, Bricia T. ([id number]) -------------------------------------------------------------------------------- Wound Assessment Details Patient Name: [name], Sandra T. Date of Service: 05/21/2019 2:00 PM Medical Record Number: [id number] Patient Account Number: [id number] Date of Birth/Sex: 03/12/1925 (85 y.o. F) Treating RN: Rodell PernaScott, Dajea Primary Care Amear Strojny: Barbette ReichmannHande, Vishwanath Other Clinician: Referring Roneisha Stern: Barbette ReichmannHande, Vishwanath Treating Tinlee Navarrette/Extender: STONE III, HOYT Weeks in Treatment: 5 Wound Status Wound Number: 1 Primary Arterial Insufficiency Ulcer Etiology: Wound Location: Left Malleolus - Lateral Wound Open Wounding Event: Gradually  Appeared Status: Date Acquired: 01/31/2019 Comorbid Arrhythmia, Hypertension, Peripheral Venous Weeks Of Treatment: 5 History: Disease, History of pressure wounds Clustered Wound: Yes Photos Photo Uploaded By: Rodell PernaScott, Dajea on 05/21/2019 15:24:11 Wound Measurements Length: (cm) 3.5 % Reduct Width: (cm) 2.4 % Reduct Depth: (cm) 0.2 Epitheli Clustered Quantity: 4 Tunnelin Area: (cm) 6.597 Undermi Volume: (cm) 1.319 ion in Area: -41.4% ion in Volume: -41.4% alization: None g: No ning: No Wound Description Full Thickness Without Exposed Support Foul Odo Classification: Structures Slough/F Wound Margin: Flat and Intact Exudate Small Amount: Exudate Type: Serous Exudate Color: amber r After Cleansing: No ibrino Yes Wound Bed Granulation Amount: Medium (34-66%) Exposed Structure Granulation Quality: Red, Hyper-granulation Fascia Exposed: No Necrotic Amount: Medium (34-66%) Fat Layer (Subcutaneous Tissue) Exposed: Yes Necrotic Quality: Adherent Slough Tendon Exposed: No Muscle Exposed: No Joint Exposed: No [place]usko, Abbagayle T. ([id number]) Bone Exposed: No Treatment Notes Wound #1 (Left, Lateral Malleolus) Notes zinc to peri wound, silvercel, xtrasorb, conform and tubigrip Electronic Signature(s) Signed: 05/21/2019 4:22:16 PM By: Rodell PernaScott, Dajea Entered By: Rodell PernaScott, Dajea on 05/21/2019 14:22:07 Georgeann OppenheimGRAHAM, Willine T. ([id number]) -------------------------------------------------------------------------------- Vitals Details Patient Name: [name], Liandra T. Date of Service: 05/21/2019 2:00 PM Medical Record Number: [id number] Patient Account Number: [id number] Date of Birth/Sex: 08/11/1925 (85 y.o. F) Treating RN: Rodell PernaScott, Dajea Primary Care Rollen Selders: Barbette ReichmannHande, Vishwanath Other Clinician: Referring Bronco Mcgrory: Barbette ReichmannHande, Vishwanath Treating Biruk Troia/Extender: Linwood DibblesSTONE III, HOYT Weeks in Treatment: 5 Vital Signs Time Taken: 14:15 Pulse (bpm): 70 Height (in): 59 Respiratory Rate  (breaths/min): 16 Weight (lbs): 98 Blood Pressure (mmHg): 128/65 Body Mass Index (BMI): 19.8 Reference Range: 80 - 120 mg / dl Electronic Signature(s) Signed: 05/21/2019 4:22:16 PM By: Rodell PernaScott, Dajea Entered By: Rodell PernaScott, Dajea on 05/21/2019 14:15:20

## 2019-05-22 NOTE — Progress Notes (Signed)
[name], [name] ([id number]) Visit Report for 05/21/2019 Chief Complaint Document Details Patient Name: [name], [name]. Date of Service: 05/21/2019 2:00 PM Medical Record Number: [id number] Patient Account Number: [id number] Date of Birth/Sex: 08/08/1925 (85 y.o. F) Treating RN: Montey Hora Primary Care Provider: Tracie Harrier Other Clinician: Referring Provider: Tracie Harrier Treating Provider/Extender: Melburn Hake, HOYT Weeks in Treatment: 5 Information Obtained from: Patient Chief Complaint Left lateral ankle ulcer Electronic Signature(s) Signed: 05/21/2019 5:06:58 PM By: Worthy Keeler PA-C Entered By: Worthy Keeler on 05/21/2019 14:12:50 Newton Pigg ([id number]) -------------------------------------------------------------------------------- HPI Details Patient Name: Danielle Charters T. Date of Service: 05/21/2019 2:00 PM Medical Record Number: [id number] Patient Account Number: [id number] Date of Birth/Sex: [birth year] (85 y.o. F) Treating RN: Montey Hora Primary Care Provider: Tracie Harrier Other Clinician: Referring Provider: Tracie Harrier Treating Provider/Extender: Melburn Hake, HOYT Weeks in Treatment: 5 History of Present Illness HPI Description: 04/16/19 patient presents today for initial evaluation our clinic concerning an issue with her left lateral ankle which has been present for roughly 7 years. She intermittently has had issues with infection unfortunately. She's also been on IV antibiotics back in August/September 2019. She has not had a positive test for osteomyelitis up to this point in her last MRI which was February 2019 was negative. She has had private work on her lower extremities having had a fem-pop bypass of the lower extremity on the left. She subsequently has had angioplasty as well as stenting of this extremity. Nonetheless overall it appears that she's had quite a bit of extensive work. Currently this healed in January 2020  when she was being seen at the [facility] wound care center. Subsequently it unfortunately reopened she tells me in March and the patient's daughter who was present during the evaluation concurs. The patient is a previous smoker she is on Plavix currently. She has no history of diabetes. We were unable to obtain an ABI due to what appears to be poor flowing to left lower extremity currently. No fevers, chills, nausea, or vomiting noted at this time. 04/30/19 on evaluation today patient actually appears to be doing fairly well in regard to her ulcer in my pinion. In fact I feel like she's showing some signs of improvement at this time which is excellent news despite the fact that were still waiting the vascular appointment. That's in just one weeks time. She has the appointment vascular on June 3 which is next Wednesday. Overall I feel like that's there's no signs of active infection at this time which is good news. I did review her culture which showed positive for Pseudomonas but again I feel like this is likely secondary to just normal skin contamination especially considering the fact that her wound is doing so well currently. Therefore I'm not going to initiate any different therapy at this point with regard to the culture results if anything changes then I will keep this in mind. 05/07/19 on evaluation today patient appears to be doing well in regard to her left lateral ankle ulcer. She is having a lot of drainage she did see her vascular surgeon and fortunately everything seems to be doing okay in that regard. I do not have that note for review yet we look to not they but unfortunately the note is not present for review as of yet. Nonetheless the patient's daughter is patient with her during the visit today and states that they were advised by the physician and vascular that there really was not anything to intervene with at  this point and that all of the testing looks okay and does not appear to change  from the prior testing. This is good news and hopefully means that the patient will have no difficulty healing with the appropriate dressing changes and interventions. Fortunately there's no signs of active infection at this time. 05/14/19 on evaluation today patient actually appears to be showing some signs of improvement at this point which is good news. Fortunately there's no signs of active infection at this time. She's been tolerating the dressing changes without complication including the Tubigrip. Overall very pleased with the progress. 05/21/19 on evaluation today patient actually appears to be showing some signs of improvement which is good news in regard to her lower extremity ulcer. Overall she still has a bit of drainage but the barrier cream does seem to have been beneficial for her. Fortunately there's no signs of active infection at this time which is good news. No fevers, chills, nausea, or vomiting noted at this time. Electronic Signature(s) Signed: 05/21/2019 5:06:58 PM By: Lenda KelpStone III, Hoyt PA-C Entered By: Lenda KelpStone III, Hoyt on 05/21/2019 15:05:10 [name], [name] ([id number]) -------------------------------------------------------------------------------- Physical Exam Details Patient Name: [name], [name] Date of Service: 05/21/2019 2:00 PM Medical Record Number: [id number] Patient Account Number: [id number] Date of Birth/Sex: 06/19/1925 (85 y.o. F) Treating RN: Curtis Sitesorthy, Joanna Primary Care Provider: Barbette ReichmannHande, Vishwanath Other Clinician: Referring Provider: Barbette ReichmannHande, Vishwanath Treating Provider/Extender: STONE III, HOYT Weeks in Treatment: 5 Constitutional Well-nourished and well-hydrated in no acute distress. Respiratory normal breathing without difficulty. clear to auscultation bilaterally. Cardiovascular regular rate and rhythm with normal S1, S2. Psychiatric this patient is able to make decisions and demonstrates good insight into disease process. Alert and Oriented x 3.  pleasant and cooperative. Notes Patient's wound at this time again show some signs of improvement although it is not a tremendous amount every little bit has come into play to overall allow her to see that this is getting better. Overall I do feel like that they are taking excellent care of this and overall I think that she is making great progress. The question did arise as to whether or not they could maybe come every other week since things are doing okay and it's just a matter of getting this time to heal. Electronic Signature(s) Signed: 05/21/2019 5:06:58 PM By: Lenda KelpStone III, Hoyt PA-C Entered By: Lenda KelpStone III, Hoyt on 05/21/2019 15:05:44 [name], [name] ([id number]) -------------------------------------------------------------------------------- Physician Orders Details Patient Name: [name], [name] Date of Service: 05/21/2019 2:00 PM Medical Record Number: [id number] Patient Account Number: [id number] Date of Birth/Sex: 12/02/1925 (85 y.o. F) Treating RN: Curtis Sitesorthy, Joanna Primary Care Provider: Barbette ReichmannHande, Vishwanath Other Clinician: Referring Provider: Barbette ReichmannHande, Vishwanath Treating Provider/Extender: Linwood DibblesSTONE III, HOYT Weeks in Treatment: 5 Verbal / Phone Orders: No Diagnosis Coding ICD-10 Coding Code Description I73.89 Other specified peripheral vascular diseases L97.322 Non-pressure chronic ulcer of left ankle with fat layer exposed I48.20 Chronic atrial fibrillation, unspecified I10 Essential (primary) hypertension Wound Cleansing Wound #1 Left,Lateral Malleolus o Clean wound with Normal Saline. Anesthetic (add to Medication List) Wound #1 Left,Lateral Malleolus o Topical Lidocaine 4% cream applied to wound bed prior to debridement (In Clinic Only). Skin Barriers/Peri-Wound Care Wound #1 Left,Lateral Malleolus o Barrier cream - to peri wound Primary Wound Dressing Wound #1 Left,Lateral Malleolus o Silver Alginate - PLEASE ORDER SILVERCEL NON ADHERENT FOR PATIENT Secondary  Dressing Wound #1 Left,Lateral Malleolus o Conform/Kerlix o XtraSorb - PLEASE PROVIDE XTRASRB FOR PATIENT R/T MACERATION Dressing Change Frequency Wound #1 Left,Lateral Malleolus o Change Dressing  Monday, Wednesday, Friday Follow-up Appointments Wound #1 Left,Lateral Malleolus o Return Appointment in 3 weeks. Edema Control Wound #1 Left,Lateral Malleolus o Other: - Nunzio CoryubiGrip E Eid, Creta T. ([id number]) Home Health Wound #1 Left,Lateral Malleolus o Continue Home Health Visits o Home Health Nurse may visit PRN to address patientos wound care needs. o FACE TO FACE ENCOUNTER: MEDICARE and MEDICAID PATIENTS: I certify that this patient is under my care and that I had a face-to-face encounter that meets the physician face-to-face encounter requirements with this patient on this date. The encounter with the patient was in whole or in part for the following MEDICAL CONDITION: (primary reason for Home Healthcare) MEDICAL NECESSITY: I certify, that based on my findings, NURSING services are a medically necessary home health service. HOME BOUND STATUS: I certify that my clinical findings support that this patient is homebound (i.e., Due to illness or injury, pt requires aid of supportive devices such as crutches, cane, wheelchairs, walkers, the use of special transportation or the assistance of another person to leave their place of residence. There is a normal inability to leave the home and doing so requires considerable and taxing effort. Other absences are for medical reasons / religious services and are infrequent or of short duration when for other reasons). o If current dressing causes regression in wound condition, may D/C ordered dressing product/s and apply Normal Saline Moist Dressing daily until next Wound Healing Center / Other MD appointment. Notify Wound Healing Center of regression in wound condition at [phone]. o Please direct any NON-WOUND related  issues/requests for orders to patient's Primary Care Physician Electronic Signature(s) Signed: 05/21/2019 5:01:18 PM By: Curtis Sitesorthy, Joanna Signed: 05/21/2019 5:06:58 PM By: Lenda KelpStone III, Hoyt PA-C Entered By: Curtis Sitesorthy, Joanna on 05/21/2019 14:55:10 [name], [name] ([id number]) -------------------------------------------------------------------------------- Problem List Details Patient Name: [name], Jazzy T. Date of Service: 05/21/2019 2:00 PM Medical Record Number: [id number] Patient Account Number: [id number] Date of Birth/Sex: 02/23/1925 (85 y.o. F) Treating RN: Curtis Sitesorthy, Joanna Primary Care Provider: Barbette ReichmannHande, Vishwanath Other Clinician: Referring Provider: Barbette ReichmannHande, Vishwanath Treating Provider/Extender: Linwood DibblesSTONE III, HOYT Weeks in Treatment: 5 Active Problems ICD-10 Evaluated Encounter Code Description Active Date Today Diagnosis I73.89 Other specified peripheral vascular diseases 04/16/2019 No Yes L97.322 Non-pressure chronic ulcer of left ankle with fat layer 04/16/2019 No Yes exposed I48.20 Chronic atrial fibrillation, unspecified 04/16/2019 No Yes I10 Essential (primary) hypertension 04/16/2019 No Yes Inactive Problems Resolved Problems Electronic Signature(s) Signed: 05/21/2019 5:06:58 PM By: Lenda KelpStone III, Hoyt PA-C Entered By: Lenda KelpStone III, Hoyt on 05/21/2019 14:12:44 [name], [name] ([id number]) -------------------------------------------------------------------------------- Progress Note Details Patient Name: [name], Yani T. Date of Service: 05/21/2019 2:00 PM Medical Record Number: [id number] Patient Account Number: [id number] Date of Birth/Sex: 02/04/1925 (85 y.o. F) Treating RN: Curtis Sitesorthy, Joanna Primary Care Provider: Barbette ReichmannHande, Vishwanath Other Clinician: Referring Provider: Barbette ReichmannHande, Vishwanath Treating Provider/Extender: Linwood DibblesSTONE III, HOYT Weeks in Treatment: 5 Subjective Chief Complaint Information obtained from Patient Left lateral ankle ulcer History of Present Illness (HPI) 04/16/19  patient presents today for initial evaluation our clinic concerning an issue with her left lateral ankle which has been present for roughly 7 years. She intermittently has had issues with infection unfortunately. She's also been on IV antibiotics back in August/September 2019. She has not had a positive test for osteomyelitis up to this point in her last MRI which was February 2019 was negative. She has had private work on her lower extremities having had a fem-pop bypass of the lower extremity on the left. She subsequently has had angioplasty as well as stenting  of this extremity. Nonetheless overall it appears that she's had quite a bit of extensive work. Currently this healed in January 2020 when she was being seen at the [facility] wound care center. Subsequently it unfortunately reopened she tells me in March and the patient's daughter who was present during the evaluation concurs. The patient is a previous smoker she is on Plavix currently. She has no history of diabetes. We were unable to obtain an ABI due to what appears to be poor flowing to left lower extremity currently. No fevers, chills, nausea, or vomiting noted at this time. 04/30/19 on evaluation today patient actually appears to be doing fairly well in regard to her ulcer in my pinion. In fact I feel like she's showing some signs of improvement at this time which is excellent news despite the fact that were still waiting the vascular appointment. That's in just one weeks time. She has the appointment vascular on June 3 which is next Wednesday. Overall I feel like that's there's no signs of active infection at this time which is good news. I did review her culture which showed positive for Pseudomonas but again I feel like this is likely secondary to just normal skin contamination especially considering the fact that her wound is doing so well currently. Therefore I'm not going to initiate any different therapy at this point with regard to  the culture results if anything changes then I will keep this in mind. 05/07/19 on evaluation today patient appears to be doing well in regard to her left lateral ankle ulcer. She is having a lot of drainage she did see her vascular surgeon and fortunately everything seems to be doing okay in that regard. I do not have that note for review yet we look to not they but unfortunately the note is not present for review as of yet. Nonetheless the patient's daughter is patient with her during the visit today and states that they were advised by the physician and vascular that there really was not anything to intervene with at this point and that all of the testing looks okay and does not appear to change from the prior testing. This is good news and hopefully means that the patient will have no difficulty healing with the appropriate dressing changes and interventions. Fortunately there's no signs of active infection at this time. 05/14/19 on evaluation today patient actually appears to be showing some signs of improvement at this point which is good news. Fortunately there's no signs of active infection at this time. She's been tolerating the dressing changes without complication including the Tubigrip. Overall very pleased with the progress. 05/21/19 on evaluation today patient actually appears to be showing some signs of improvement which is good news in regard to her lower extremity ulcer. Overall she still has a bit of drainage but the barrier cream does seem to have been beneficial for her. Fortunately there's no signs of active infection at this time which is good news. No fevers, chills, nausea, or vomiting noted at this time. Patient History Information obtained from Patient. Family History SAIDE, LANUZA ([id number]) Heart Disease - Father, Hypertension - Father, No family history of Cancer, Diabetes, Hereditary Spherocytosis, Kidney Disease, Lung Disease, Seizures, Stroke, Thyroid Problems,  Tuberculosis. Social History Former smoker - ended on 12/02/2018, Marital Status - Widowed, Alcohol Use - Never, Drug Use - No History. Medical History Eyes Denies history of Cataracts, Glaucoma, Optic Neuritis Ear/Nose/Mouth/Throat Denies history of Chronic sinus problems/congestion, Middle ear problems Hematologic/Lymphatic Denies history  of Anemia, Hemophilia, Human Immunodeficiency Virus, Lymphedema, Sickle Cell Disease Respiratory Denies history of Aspiration, Asthma, Chronic Obstructive Pulmonary Disease (COPD), Pneumothorax, Sleep Apnea, Tuberculosis Cardiovascular Patient has history of Arrhythmia - A Fib, Tachycardia, palpitations, Hypertension, Peripheral Venous Disease Endocrine Denies history of Type I Diabetes, Type II Diabetes Genitourinary Denies history of End Stage Renal Disease Integumentary (Skin) Patient has history of History of pressure wounds - Current wound 2013 Denies history of History of Burn Musculoskeletal Denies history of Gout Oncologic Denies history of Received Chemotherapy, Received Radiation Hospitalization/Surgery History - Abdominal hysterectomy. - Angiogram 12/15/12,09/07/14. - Angiogram 05/2016. - Angioplasty / Stenting iliac. - Colonoscopy 2001. - Pulmonary angioplasty 05/15/2017. Medical And Surgical History Notes Musculoskeletal Degenerative joint disease, Degenerative arthritis. osteoporosis Review of Systems (ROS) Constitutional Symptoms (General Health) Denies complaints or symptoms of Fatigue, Fever, Chills, Marked Weight Change. Respiratory Denies complaints or symptoms of Chronic or frequent coughs, Shortness of Breath. Cardiovascular Denies complaints or symptoms of Chest pain, LE edema. Psychiatric Denies complaints or symptoms of Anxiety, Claustrophobia. Objective Constitutional GIANNY, KILLMAN T. ([id number]) Well-nourished and well-hydrated in no acute distress. Vitals Time Taken: 2:15 PM, Height: 59 in, Weight: 98 lbs, BMI:  19.8, Pulse: 70 bpm, Respiratory Rate: 16 breaths/min, Blood Pressure: 128/65 mmHg. Respiratory normal breathing without difficulty. clear to auscultation bilaterally. Cardiovascular regular rate and rhythm with normal S1, S2. Psychiatric this patient is able to make decisions and demonstrates good insight into disease process. Alert and Oriented x 3. pleasant and cooperative. General Notes: Patient's wound at this time again show some signs of improvement although it is not a tremendous amount every little bit has come into play to overall allow her to see that this is getting better. Overall I do feel like that they are taking excellent care of this and overall I think that she is making great progress. The question did arise as to whether or not they could maybe come every other week since things are doing okay and it's just a matter of getting this time to heal. Integumentary (Hair, Skin) Wound #1 status is Open. Original cause of wound was Gradually Appeared. The wound is located on the Left,Lateral Malleolus. The wound measures 3.5cm length x 2.4cm width x 0.2cm depth; 6.597cm^2 area and 1.319cm^3 volume. There is Fat Layer (Subcutaneous Tissue) Exposed exposed. There is no tunneling or undermining noted. There is a small amount of serous drainage noted. The wound margin is flat and intact. There is medium (34-66%) red, hyper - granulation within the wound bed. There is a medium (34-66%) amount of necrotic tissue within the wound bed including Adherent Slough. Assessment Active Problems ICD-10 Other specified peripheral vascular diseases Non-pressure chronic ulcer of left ankle with fat layer exposed Chronic atrial fibrillation, unspecified Essential (primary) hypertension Plan Wound Cleansing: Wound #1 Left,Lateral Malleolus: Clean wound with Normal Saline. Anesthetic (add to Medication List): Wound #1 Left,Lateral Malleolus: Topical Lidocaine 4% cream applied to wound bed  prior to debridement (In Clinic Only). Skin Barriers/Peri-Wound Care: Wound #1 Left,Lateral Malleolus: Barrier cream - to peri wound VENECIA, MEHL T. ([id number]) Primary Wound Dressing: Wound #1 Left,Lateral Malleolus: Silver Alginate - PLEASE ORDER SILVERCEL NON ADHERENT FOR PATIENT Secondary Dressing: Wound #1 Left,Lateral Malleolus: Conform/Kerlix XtraSorb - PLEASE PROVIDE XTRASRB FOR PATIENT R/T MACERATION Dressing Change Frequency: Wound #1 Left,Lateral Malleolus: Change Dressing Monday, Wednesday, Friday Follow-up Appointments: Wound #1 Left,Lateral Malleolus: Return Appointment in 3 weeks. Edema Control: Wound #1 Left,Lateral Malleolus: Other: - TubiGrip E Home Health: Wound #1 Left,Lateral Malleolus: Continue Home Health  Visits Home Health Nurse may visit PRN to address patient s wound care needs. FACE TO FACE ENCOUNTER: MEDICARE and MEDICAID PATIENTS: I certify that this patient is under my care and that I had a face-to-face encounter that meets the physician face-to-face encounter requirements with this patient on this date. The encounter with the patient was in whole or in part for the following MEDICAL CONDITION: (primary reason for Home Healthcare) MEDICAL NECESSITY: I certify, that based on my findings, NURSING services are a medically necessary home health service. HOME BOUND STATUS: I certify that my clinical findings support that this patient is homebound (i.e., Due to illness or injury, pt requires aid of supportive devices such as crutches, cane, wheelchairs, walkers, the use of special transportation or the assistance of another person to leave their place of residence. There is a normal inability to leave the home and doing so requires considerable and taxing effort. Other absences are for medical reasons / religious services and are infrequent or of short duration when for other reasons). If current dressing causes regression in wound condition, may D/C  ordered dressing product/s and apply Normal Saline Moist Dressing daily until next Wound Healing Center / Other MD appointment. Notify Wound Healing Center of regression in wound condition at [phone]. Please direct any NON-WOUND related issues/requests for orders to patient's Primary Care Physician At this point I do think that it's appropriate for [place] to go ahead and initiate every other week visits for the patient since she is doing well and is just a matter again of giving her time to allow this area to heal. With that being said at this point I think that the biggest issue is that in two weeks the office will not be open the form and actually have a come back in three weeks. The daughter is in agreement with this plan as is the patient. If anything changes she will contact the office and let me know. Nonetheless I think that she would be quite fine. They know to contact the office if there is any deterioration. Please see above for specific wound care orders. We will see patient for re-evaluation in 3 week(s) here in the clinic. If anything worsens or changes patient will contact our office for additional recommendations. Electronic Signature(s) Signed: 05/21/2019 5:06:58 PM By: Lenda Kelp PA-C Entered By: Lenda Kelp on 05/21/2019 15:06:47 Danielle Oppenheim ([id number]) -------------------------------------------------------------------------------- ROS/PFSH Details Patient Name: Danielle Coco T. Date of Service: 05/21/2019 2:00 PM Medical Record Number: [id number] Patient Account Number: [id number] Date of Birth/Sex: 05-09-1925 (85 y.o. F) Treating RN: Curtis Sites Primary Care Provider: Barbette Reichmann Other Clinician: Referring Provider: Barbette Reichmann Treating Provider/Extender: Linwood Dibbles, HOYT Weeks in Treatment: 5 Information Obtained From Patient Constitutional Symptoms (General Health) Complaints and Symptoms: Negative for: Fatigue; Fever; Chills; Marked  Weight Change Respiratory Complaints and Symptoms: Negative for: Chronic or frequent coughs; Shortness of Breath Medical History: Negative for: Aspiration; Asthma; Chronic Obstructive Pulmonary Disease (COPD); Pneumothorax; Sleep Apnea; Tuberculosis Cardiovascular Complaints and Symptoms: Negative for: Chest pain; LE edema Medical History: Positive for: Arrhythmia - A Fib, Tachycardia, palpitations; Hypertension; Peripheral Venous Disease Psychiatric Complaints and Symptoms: Negative for: Anxiety; Claustrophobia Eyes Medical History: Negative for: Cataracts; Glaucoma; Optic Neuritis Ear/Nose/Mouth/Throat Medical History: Negative for: Chronic sinus problems/congestion; Middle ear problems Hematologic/Lymphatic Medical History: Negative for: Anemia; Hemophilia; Human Immunodeficiency Virus; Lymphedema; Sickle Cell Disease Endocrine Medical History: Negative for: Type I Diabetes; Type II Diabetes EURIKA, SANDY ([id number]) Genitourinary Medical History: Negative for: End Stage  Renal Disease Integumentary (Skin) Medical History: Positive for: History of pressure wounds - Current wound 2013 Negative for: History of Burn Musculoskeletal Medical History: Negative for: Gout Past Medical History Notes: Degenerative joint disease, Degenerative arthritis. osteoporosis Oncologic Medical History: Negative for: Received Chemotherapy; Received Radiation Immunizations Pneumococcal Vaccine: Received Pneumococcal Vaccination: Yes Implantable Devices No devices added Hospitalization / Surgery History Type of Hospitalization/Surgery Abdominal hysterectomy Angiogram 12/15/12,09/07/14 Angiogram 05/2016 Angioplasty / Stenting iliac Colonoscopy 2001 Pulmonary angioplasty 05/15/2017 Family and Social History Cancer: No; Diabetes: No; Heart Disease: Yes - Father; Hereditary Spherocytosis: No; Hypertension: Yes - Father; Kidney Disease: No; Lung Disease: No; Seizures: No; Stroke: No;  Thyroid Problems: No; Tuberculosis: No; Former smoker - ended on 12/02/2018; Marital Status - Widowed; Alcohol Use: Never; Drug Use: No History; Financial Concerns: No; Food, Clothing or Shelter Needs: No; Support System Lacking: No; Transportation Concerns: No Physician Affirmation I have reviewed and agree with the above information. Electronic Signature(s) Signed: 05/21/2019 5:01:18 PM By: Curtis Sitesorthy, Joanna Signed: 05/21/2019 5:06:58 PM By: Lenda KelpStone III, Hoyt PA-C Entered By: Lenda KelpStone III, Hoyt on 05/21/2019 15:05:26 [name], Campbell T. ([id number]) -------------------------------------------------------------------------------- SuperBill Details Patient Name: [name], Jakiera T. Date of Service: 05/21/2019 Medical Record Number: [id number] Patient Account Number: [id number] Date of Birth/Sex: 12/14/1924 (85 y.o. F) Treating RN: Curtis Sitesorthy, Joanna Primary Care Provider: Barbette ReichmannHande, Vishwanath Other Clinician: Referring Provider: Barbette ReichmannHande, Vishwanath Treating Provider/Extender: Linwood DibblesSTONE III, HOYT Weeks in Treatment: 5 Diagnosis Coding ICD-10 Codes Code Description I73.89 Other specified peripheral vascular diseases L97.322 Non-pressure chronic ulcer of left ankle with fat layer exposed I48.20 Chronic atrial fibrillation, unspecified I10 Essential (primary) hypertension Facility Procedures CPT4 Code: [id number] Description: 99213 - WOUND CARE VISIT-LEV 3 EST PT Modifier: Quantity: 1 Physician Procedures CPT4 Code: 30865786770424 Description: 99214 - WC PHYS LEVEL 4 - EST PT ICD-10 Diagnosis Description I73.89 Other specified peripheral vascular diseases L97.322 Non-pressure chronic ulcer of left ankle with fat layer ex I48.20 Chronic atrial fibrillation, unspecified I10  Essential (primary) hypertension Modifier: posed Quantity: 1 Electronic Signature(s) Signed: 05/21/2019 3:10:56 PM By: Curtis Sitesorthy, Joanna Signed: 05/21/2019 5:06:58 PM By: Lenda KelpStone III, Hoyt PA-C Entered By: Curtis Sitesorthy, Joanna on 05/21/2019 15:10:56

## 2019-06-11 ENCOUNTER — Encounter: Payer: Medicare Other | Attending: Physician Assistant | Admitting: Physician Assistant

## 2019-06-11 ENCOUNTER — Other Ambulatory Visit: Payer: Self-pay

## 2019-06-11 DIAGNOSIS — I7389 Other specified peripheral vascular diseases: Secondary | ICD-10-CM | POA: Diagnosis not present

## 2019-06-11 DIAGNOSIS — I10 Essential (primary) hypertension: Secondary | ICD-10-CM | POA: Diagnosis not present

## 2019-06-11 DIAGNOSIS — I482 Chronic atrial fibrillation, unspecified: Secondary | ICD-10-CM | POA: Diagnosis not present

## 2019-06-11 DIAGNOSIS — Z87891 Personal history of nicotine dependence: Secondary | ICD-10-CM | POA: Diagnosis not present

## 2019-06-11 DIAGNOSIS — L97322 Non-pressure chronic ulcer of left ankle with fat layer exposed: Secondary | ICD-10-CM | POA: Diagnosis not present

## 2019-06-12 NOTE — Progress Notes (Signed)
[name], [name] ([id number]) Visit Report for 06/11/2019 Arrival Information Details Patient Name: [name], [name] Date of Service: 06/11/2019 12:45 PM Medical Record Number: [id number] Patient Account Number: [id number] Date of Birth/Sex: 01/16/1925 (85 y.o. F) Treating RN: Arnette NorrisBiell, Kristina Primary Care Otto Felkins: Barbette ReichmannHande, Vishwanath Other Clinician: Referring Rozanna Cormany: Barbette ReichmannHande, Vishwanath Treating Ethelene Closser/Extender: Linwood DibblesSTONE III, HOYT Weeks in Treatment: 8 Visit Information History Since Last Visit Added or deleted any medications: No Patient Arrived: Wheel Chair Any new allergies or adverse reactions: No Arrival Time: 12:51 Had a fall or experienced change in No Accompanied By: daughter activities of daily living that may affect Transfer Assistance: EasyPivot Patient risk of falls: Lift Signs or symptoms of abuse/neglect since last visito No Patient Identification Verified: Yes Has Dressing in Place as Prescribed: Yes Secondary Verification Process Yes Has Compression in Place as Prescribed: No Completed: Pain Present Now: No Patient Requires Transmission-Based No Precautions: Electronic Signature(s) Signed: 06/11/2019 2:24:01 PM By: Arnette NorrisBiell, Kristina Entered By: Arnette NorrisBiell, Kristina on 06/11/2019 12:55:21 [name], [name] ([id number]) -------------------------------------------------------------------------------- Clinic Level of Care Assessment Details Patient Name: [name], [name] Date of Service: 06/11/2019 12:45 PM Medical Record Number: [id number] Patient Account Number: [id number] Date of Birth/Sex: 07/23/1925 (85 y.o. F) Treating RN: Curtis Sitesorthy, Joanna Primary Care Merrin Mcvicker: Barbette ReichmannHande, Vishwanath Other Clinician: Referring Winnifred Dufford: Barbette ReichmannHande, Vishwanath Treating Melesa Lecy/Extender: Linwood DibblesSTONE III, HOYT Weeks in Treatment: 8 Clinic Level of Care Assessment Items TOOL 4 Quantity Score []  - Use when only an EandM is performed on FOLLOW-UP visit 0 ASSESSMENTS - Nursing Assessment /  Reassessment X - Reassessment of Co-morbidities (includes updates in patient status) 1 10 X- 1 5 Reassessment of Adherence to Treatment Plan ASSESSMENTS - Wound and Skin Assessment / Reassessment X - Simple Wound Assessment / Reassessment - one wound 1 5 []  - 0 Complex Wound Assessment / Reassessment - multiple wounds []  - 0 Dermatologic / Skin Assessment (not related to wound area) ASSESSMENTS - Focused Assessment []  - Circumferential Edema Measurements - multi extremities 0 []  - 0 Nutritional Assessment / Counseling / Intervention X- 1 5 Lower Extremity Assessment (monofilament, tuning fork, pulses) []  - 0 Peripheral Arterial Disease Assessment (using hand held doppler) ASSESSMENTS - Ostomy and/or Continence Assessment and Care []  - Incontinence Assessment and Management 0 []  - 0 Ostomy Care Assessment and Management (repouching, etc.) PROCESS - Coordination of Care X - Simple Patient / Family Education for ongoing care 1 15 []  - 0 Complex (extensive) Patient / Family Education for ongoing care X- 1 10 Staff obtains ChiropractorConsents, Records, Test Results / Process Orders []  - 0 Staff telephones HHA, Nursing Homes / Clarify orders / etc []  - 0 Routine Transfer to another Facility (non-emergent condition) []  - 0 Routine Hospital Admission (non-emergent condition) []  - 0 New Admissions / Manufacturing engineernsurance Authorizations / Ordering NPWT, Apligraf, etc. []  - 0 Emergency Hospital Admission (emergent condition) X- 1 10 Simple Discharge Coordination [name], [name] ([id number]) []  - 0 Complex (extensive) Discharge Coordination PROCESS - Special Needs []  - Pediatric / Minor Patient Management 0 []  - 0 Isolation Patient Management []  - 0 Hearing / Language / Visual special needs []  - 0 Assessment of Community assistance (transportation, D/C planning, etc.) []  - 0 Additional assistance / Altered mentation []  - 0 Support Surface(s) Assessment (bed, cushion, seat, etc.) INTERVENTIONS -  Wound Cleansing / Measurement X - Simple Wound Cleansing - one wound 1 5 []  - 0 Complex Wound Cleansing - multiple wounds X- 1 5 Wound Imaging (photographs - any number of wounds) []  - 0  Wound Tracing (instead of photographs) X- 1 5 Simple Wound Measurement - one wound []  - 0 Complex Wound Measurement - multiple wounds INTERVENTIONS - Wound Dressings X - Small Wound Dressing one or multiple wounds 1 10 []  - 0 Medium Wound Dressing one or multiple wounds []  - 0 Large Wound Dressing one or multiple wounds []  - 0 Application of Medications - topical []  - 0 Application of Medications - injection INTERVENTIONS - Miscellaneous []  - External ear exam 0 []  - 0 Specimen Collection (cultures, biopsies, blood, body fluids, etc.) []  - 0 Specimen(s) / Culture(s) sent or taken to Lab for analysis []  - 0 Patient Transfer (multiple staff / Nurse, adultHoyer Lift / Similar devices) []  - 0 Simple Staple / Suture removal (25 or less) []  - 0 Complex Staple / Suture removal (26 or more) []  - 0 Hypo / Hyperglycemic Management (close monitor of Blood Glucose) []  - 0 Ankle / Brachial Index (ABI) - do not check if billed separately X- 1 5 Vital Signs [name], [name] ([id number]) Has the patient been seen at the hospital within the last three years: Yes Total Score: 90 Level Of Care: New/Established - Level 3 Electronic Signature(s) Signed: 06/11/2019 4:39:46 PM By: Curtis Sitesorthy, Joanna Entered By: Curtis Sitesorthy, Joanna on 06/11/2019 13:20:07 [name], [name] ([id number]) -------------------------------------------------------------------------------- Encounter Discharge Information Details Patient Name: [name], Calee T. Date of Service: 06/11/2019 12:45 PM Medical Record Number: [id number] Patient Account Number: [id number] Date of Birth/Sex: 05/12/1925 (85 y.o. F) Treating RN: Curtis Sitesorthy, Joanna Primary Care Pierra Skora: Barbette ReichmannHande, Vishwanath Other Clinician: Referring Seif Teichert: Barbette ReichmannHande, Vishwanath Treating  Chaz Ronning/Extender: Linwood DibblesSTONE III, HOYT Weeks in Treatment: 8 Encounter Discharge Information Items Discharge Condition: Stable Ambulatory Status: Wheelchair Discharge Destination: Home Transportation: Private Auto Accompanied By: daughter Schedule Follow-up Appointment: Yes Clinical Summary of Care: Electronic Signature(s) Signed: 06/11/2019 4:39:46 PM By: Curtis Sitesorthy, Joanna Entered By: Curtis Sitesorthy, Joanna on 06/11/2019 13:21:01 [name], [name] ([id number]) -------------------------------------------------------------------------------- Lower Extremity Assessment Details Patient Name: [name], Ikia T. Date of Service: 06/11/2019 12:45 PM Medical Record Number: [id number] Patient Account Number: [id number] Date of Birth/Sex: 02/17/1925 (85 y.o. F) Treating RN: Arnette NorrisBiell, Kristina Primary Care Brealynn Contino: Barbette ReichmannHande, Vishwanath Other Clinician: Referring Viaan Knippenberg: Barbette ReichmannHande, Vishwanath Treating Analyce Tavares/Extender: Linwood DibblesSTONE III, HOYT Weeks in Treatment: 8 Vascular Assessment Pulses: Dorsalis Pedis Palpable: [Left:Yes] Posterior Tibial Palpable: [Left:Yes] Electronic Signature(s) Signed: 06/11/2019 2:24:01 PM By: Arnette NorrisBiell, Kristina Entered By: Arnette NorrisBiell, Kristina on 06/11/2019 13:03:07 [name], Ky T. ([id number]) -------------------------------------------------------------------------------- Multi Wound Chart Details Patient Name: [name], Kabao T. Date of Service: 06/11/2019 12:45 PM Medical Record Number: [id number] Patient Account Number: [id number] Date of Birth/Sex: 01/17/1925 (85 y.o. F) Treating RN: Curtis Sitesorthy, Joanna Primary Care Wayne Brunker: Barbette ReichmannHande, Vishwanath Other Clinician: Referring Bridney Guadarrama: Barbette ReichmannHande, Vishwanath Treating Hiram Mciver/Extender: Linwood DibblesSTONE III, HOYT Weeks in Treatment: 8 Vital Signs Height(in): 59 Pulse(bpm): 80 Weight(lbs): 98 Blood Pressure(mmHg): 118/69 Body Mass Index(BMI): 20 Temperature(F): 97.9 Respiratory Rate 20 (breaths/min): Photos: [N/A:N/A] Wound Location: Left Malleolus -  Lateral N/A N/A Wounding Event: Gradually Appeared N/A N/A Primary Etiology: Arterial Insufficiency Ulcer N/A N/A Comorbid History: Arrhythmia, Hypertension, N/A N/A Peripheral Venous Disease, History of pressure wounds Date Acquired: 01/31/2019 N/A N/A Weeks of Treatment: 8 N/A N/A Wound Status: Open N/A N/A Clustered Wound: Yes N/A N/A Clustered Quantity: 4 N/A N/A Measurements L x W x D 3.7x3.3x0.2 N/A N/A (cm) Area (cm) : 9.59 N/A N/A Volume (cm) : 1.918 N/A N/A % Reduction in Area: -105.60% N/A N/A % Reduction in Volume: -105.60% N/A N/A Classification: Full Thickness Without N/A N/A Exposed Support Structures Exudate Amount: Small N/A N/A  Exudate Type: Serous N/A N/A Exudate Color: amber N/A N/A Wound Margin: Flat and Intact N/A N/A Granulation Amount: Medium (34-66%) N/A N/A Granulation Quality: Red, Hyper-granulation N/A N/A Necrotic Amount: Medium (34-66%) N/A N/A Exposed Structures: Fat Layer (Subcutaneous N/A N/A Tissue) Exposed: Yes Fascia: No DERETHA, ERTLE T. ([id number]) Tendon: No Muscle: No Joint: No Bone: No Epithelialization: None N/A N/A Treatment Notes Electronic Signature(s) Signed: 06/11/2019 4:39:46 PM By: Montey Hora Entered By: Montey Hora on 06/11/2019 13:14:46 Newton Pigg ([id number]) -------------------------------------------------------------------------------- [place] Details Patient Name: [name] Date of Service: 06/11/2019 12:45 PM Medical Record Number: [id number] Patient Account Number: [id number] Date of Birth/Sex: Mar 20, 1925 (85 y.o. F) Treating RN: Montey Hora Primary Care Alyviah Crandle: Tracie Harrier Other Clinician: Referring Asier Desroches: Tracie Harrier Treating Walton Digilio/Extender: Melburn Hake, HOYT Weeks in Treatment: 8 Active Inactive Abuse / Safety / Falls / Self Care Management Nursing Diagnoses: Potential for falls Goals: Patient will not experience any injury related to  falls Date Initiated: 04/16/2019 Target Resolution Date: 07/10/2019 Goal Status: Active Interventions: Assess fall risk on admission and as needed Notes: Nutrition Nursing Diagnoses: Potential for alteratiion in Nutrition/Potential for imbalanced nutrition Goals: Patient/caregiver agrees to and verbalizes understanding of need to use nutritional supplements and/or vitamins as prescribed Date Initiated: 04/16/2019 Target Resolution Date: 07/10/2019 Goal Status: Active Interventions: Assess patient nutrition upon admission and as needed per policy Notes: Orientation to the Wound Care Program Nursing Diagnoses: Knowledge deficit related to the wound healing center program Goals: Patient/caregiver will verbalize understanding of the [facility] Program Date Initiated: 04/16/2019 Target Resolution Date: 07/10/2019 Goal Status: Active Interventions: Provide education on orientation to the wound center [place], Colvin Caroli T. ([id number]) Notes: Wound/Skin Impairment Nursing Diagnoses: Impaired tissue integrity Goals: Ulcer/skin breakdown will heal within 14 weeks Date Initiated: 04/16/2019 Target Resolution Date: 07/10/2019 Goal Status: Active Interventions: Assess patient/caregiver ability to obtain necessary supplies Assess patient/caregiver ability to perform ulcer/skin care regimen upon admission and as needed Assess ulceration(s) every visit Notes: Electronic Signature(s) Signed: 06/11/2019 4:39:46 PM By: Montey Hora Entered By: Montey Hora on 06/11/2019 13:14:38 Newton Pigg ([id number]) -------------------------------------------------------------------------------- Pain Assessment Details Patient Name: [name] Date of Service: 06/11/2019 12:45 PM Medical Record Number: [id number] Patient Account Number: [id number] Date of Birth/Sex: [birth year] (85 y.o. F) Treating RN: Harold Barban Primary Care Camila Maita: Tracie Harrier Other Clinician: Referring  Samera Macy: Tracie Harrier Treating Adonia Porada/Extender: Melburn Hake, HOYT Weeks in Treatment: 8 Active Problems Location of Pain Severity and Description of Pain Patient Has Paino Yes Site Locations Duration of the Pain. Constant / Intermittento Constant Rate the pain. Current Pain Level: 8 Character of Pain Describe the Pain: Burning Pain Management and Medication Current Pain Management: Electronic Signature(s) Signed: 06/11/2019 2:24:01 PM By: Harold Barban Entered By: Harold Barban on 06/11/2019 12:57:17 Newton Pigg ([id number]) -------------------------------------------------------------------------------- Patient/Caregiver Education Details Patient Name: [name] Date of Service: 06/11/2019 12:45 PM Medical Record Number: [id number] Patient Account Number: [id number] Date of Birth/Gender: 01-Dec-1925 (85 y.o. F) Treating RN: Montey Hora Primary Care Physician: Tracie Harrier Other Clinician: Referring Physician: Tracie Harrier Treating Physician/Extender: Sharalyn Ink in Treatment: 8 Education Assessment Education Provided To: Patient and Caregiver Education Topics Provided Wound/Skin Impairment: Handouts: Other: wound care as ordered Methods: Demonstration, Explain/Verbal Responses: State content correctly Electronic Signature(s) Signed: 06/11/2019 4:39:46 PM By: Montey Hora Entered By: Montey Hora on 06/11/2019 13:20:30 Newton Pigg ([id number]) -------------------------------------------------------------------------------- Wound Assessment Details Patient Name: [name] Date of Service: 06/11/2019 12:45 PM Medical Record Number: [id number] Patient  Account Number: [id number] Date of Birth/Sex: 02/13/1925 (85 y.o. F) Treating RN: Arnette NorrisBiell, Kristina Primary Care Lataja Newland: Barbette ReichmannHande, Vishwanath Other Clinician: Referring Hartley Urton: Barbette ReichmannHande, Vishwanath Treating Alyra Patty/Extender: Linwood DibblesSTONE III, HOYT Weeks in Treatment:  8 Wound Status Wound Number: 1 Primary Arterial Insufficiency Ulcer Etiology: Wound Location: Left Malleolus - Lateral Wound Open Wounding Event: Gradually Appeared Status: Date Acquired: 01/31/2019 Comorbid Arrhythmia, Hypertension, Peripheral Venous Weeks Of Treatment: 8 History: Disease, History of pressure wounds Clustered Wound: Yes Photos Wound Measurements Length: (cm) 3.7 % Reduct Width: (cm) 3.3 % Reduct Depth: (cm) 0.2 Epitheli Clustered Quantity: 4 Tunnelin Area: (cm) 9.59 Undermi Volume: (cm) 1.918 ion in Area: -105.6% ion in Volume: -105.6% alization: None g: No ning: No Wound Description Full Thickness Without Exposed Support Foul Odo Classification: Structures Slough/F Wound Margin: Flat and Intact Exudate Small Amount: Exudate Type: Serous Exudate Color: amber [name] After Cleansing: No ibrino Yes Wound Bed Granulation Amount: Medium (34-66%) Exposed Structure Granulation Quality: Red, Hyper-granulation Fascia Exposed: No Necrotic Amount: Medium (34-66%) Fat Layer (Subcutaneous Tissue) Exposed: Yes Necrotic Quality: Adherent Slough Tendon Exposed: No Muscle Exposed: No Joint Exposed: No Bone Exposed: No Agar, Winston T. ([id number]) Treatment Notes Wound #1 (Left, Lateral Malleolus) Notes zinc to peri wound, silvercel, xtrasorb, conform and tubigrip Electronic Signature(s) Signed: 06/11/2019 2:24:01 PM By: Arnette NorrisBiell, Kristina Entered By: Arnette NorrisBiell, Kristina on 06/11/2019 13:02:42 [name], Lucyle T. ([id number]) -------------------------------------------------------------------------------- Vitals Details Patient Name: [name], Zoria T. Date of Service: 06/11/2019 12:45 PM Medical Record Number: [id number] Patient Account Number: [id number] Date of Birth/Sex: 01/09/1925 (85 y.o. F) Treating RN: Arnette NorrisBiell, Kristina Primary Care Mariama Saintvil: Barbette ReichmannHande, Vishwanath Other Clinician: Referring Toshiko Kemler: Barbette ReichmannHande, Vishwanath Treating Meyli Boice/Extender: Linwood DibblesSTONE III,  HOYT Weeks in Treatment: 8 Vital Signs Time Taken: 12:57 Temperature (F): 97.9 Height (in): 59 Pulse (bpm): 80 Weight (lbs): 98 Respiratory Rate (breaths/min): 20 Body Mass Index (BMI): 19.8 Blood Pressure (mmHg): 118/69 Reference Range: 80 - 120 mg / dl Electronic Signature(s) Signed: 06/11/2019 2:24:01 PM By: Arnette NorrisBiell, Kristina Entered By: Arnette NorrisBiell, Kristina on 06/11/2019 12:57:46

## 2019-06-14 NOTE — Progress Notes (Signed)
[name], [name] ([id number]) Visit Report for 06/11/2019 Chief Complaint Document Details Patient Name: [name], [name]. Date of Service: 06/11/2019 12:45 PM Medical Record Number: [id number] Patient Account Number: [id number] Date of Birth/Sex: January 25, 1925 (85 yGoodo. F) Treating RN: Curtis Sites Primary Care Provider: Barbette Reichmann Other Clinician: Referring Provider: Barbette Reichmann Treating Provider/Extender: Linwood Dibbles, HOYT Weeks in Treatment: 8 Information Obtained from: Patient Chief Complaint Left lateral ankle ulcer Electronic Signature(s) Signed: 06/12/2019 9:51:47 PM By: Lenda Kelp PA-C Entered By: Lenda Kelp on 06/11/2019 13:12:57 [name] ([id number]) -------------------------------------------------------------------------------- HPI Details Patient Name: Danielle Coco T. Date of Service: 06/11/2019 12:45 PM Medical Record Number: [id number] Patient Account Number: [id number] Date of Birth/Sex: [birth year] (85 yGoodo. F) Treating RN: Curtis Sites Primary Care Provider: Barbette Reichmann Other Clinician: Referring Provider: Barbette Reichmann Treating Provider/Extender: Linwood Dibbles, HOYT Weeks in Treatment: 8 History of Present Illness HPI Description: 04/16/19 patient presents today for initial evaluation our clinic concerning an issue with her left lateral ankle which has been present for roughly 7 years. She intermittently has had issues with infection unfortunately. She's also been on IV antibiotics back in August/September 2019. She has not had a positive test for osteomyelitis up to this point in her last MRI which was February 2019 was negative. She has had private work on her lower extremities having had a fem-pop bypass of the lower extremity on the left. She subsequently has had angioplasty as well as stenting of this extremity. Nonetheless overall it appears that she's had quite a bit of extensive work. Currently this healed in January 2020  when she was being seen at the [facility] wound care center. Subsequently it unfortunately reopened she tells me in March and the patient's daughter who was present during the evaluation concurs. The patient is a previous smoker she is on Plavix currently. She has no history of diabetes. We were unable to obtain an ABI due to what appears to be poor flowing to left lower extremity currently. No fevers, chills, nausea, or vomiting noted at this time. 04/30/19 on evaluation today patient actually appears to be doing fairly well in regard to her ulcer in my pinion. In fact I feel like she's showing some signs of improvement at this time which is excellent news despite the fact that were still waiting the vascular appointment. That's in just one weeks time. She has the appointment vascular on June 3 which is next Wednesday. Overall I feel like that's there's no signs of active infection at this time which is good news. I did review her culture which showed positive for Pseudomonas but again I feel like this is likely secondary to just normal skin contamination especially considering the fact that her wound is doing so well currently. Therefore I'm not going to initiate any different therapy at this point with regard to the culture results if anything changes then I will keep this in mind. 05/07/19 on evaluation today patient appears to be doing well in regard to her left lateral ankle ulcer. She is having a lot of drainage she did see her vascular surgeon and fortunately everything seems to be doing okay in that regard. I do not have that note for review yet we look to not they but unfortunately the note is not present for review as of yet. Nonetheless the patient's daughter is patient with her during the visit today and states that they were advised by the physician and vascular that there really was not anything to intervene with at  this point and that all of the testing looks okay and does not appear to change  from the prior testing. This is good news and hopefully means that the patient will have no difficulty healing with the appropriate dressing changes and interventions. Fortunately there's no signs of active infection at this time. 05/14/19 on evaluation today patient actually appears to be showing some signs of improvement at this point which is good news. Fortunately there's no signs of active infection at this time. She's been tolerating the dressing changes without complication including the Tubigrip. Overall very pleased with the progress. 05/21/19 on evaluation today patient actually appears to be showing some signs of improvement which is good news in regard to her lower extremity ulcer. Overall she still has a bit of drainage but the barrier cream does seem to have been beneficial for her. Fortunately there's no signs of active infection at this time which is good news. No fevers, chills, nausea, or vomiting noted at this time. 06/11/19 on evaluation today patient actually appears to be making some progress in regard to her ulcer on the left lateral malleolus. She is still having some pain but nothing insignificant is what she was previously experiencing. Overall I feel like she's. She is headed in the right direction. Electronic Signature(s) Signed: 06/12/2019 9:51:47 PM By: Lenda Kelp PA-C Entered By: Lenda Kelp on 06/12/2019 21:38:51 [name] ([id number]) -------------------------------------------------------------------------------- Physical Exam Details Patient Name: Danielle Coco T. Date of Service: 06/11/2019 12:45 PM Medical Record Number: [id number] Patient Account Number: [id number] Date of Birth/Sex: 07/29/1925 (85 yGoodo. F) Treating RN: Curtis Sites Primary Care Provider: Barbette Reichmann Other Clinician: Referring Provider: Barbette Reichmann Treating Provider/Extender: STONE III, HOYT Weeks in Treatment: 8 Constitutional Well-nourished and well-hydrated  in no acute distress. Respiratory normal breathing without difficulty. clear to auscultation bilaterally. Cardiovascular regular rate and rhythm with normal S1, S2. Psychiatric this patient is able to make decisions and demonstrates good insight into disease process. Alert and Oriented x 3. pleasant and cooperative. Notes Patient's wound bed currently showed good granulation with no significant Slough buildup and good signs of epithelialization around different areas of the wound. Overall very pleased with how things seem to be progressing. The patient's daughter is likewise pleased as is the patient. Electronic Signature(s) Signed: 06/12/2019 9:51:47 PM By: Lenda Kelp PA-C Entered By: Lenda Kelp on 06/12/2019 21:39:15 [name] ([id number]) -------------------------------------------------------------------------------- Physician Orders Details Patient Name: Danielle Coco T. Date of Service: 06/11/2019 12:45 PM Medical Record Number: [id number] Patient Account Number: [id number] Date of Birth/Sex: 1925-06-19 (85 yGoodo. 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 Anesthetic (add to Medication List) Wound #1 Left,Lateral Malleolus o Topical Lidocaine 4% cream applied to wound bed prior to debridement (In Clinic Only). Skin Barriers/Peri-Wound Care Wound #1 Left,Lateral Malleolus o Barrier cream - to peri wound Primary Wound Dressing Wound #1  Left,Lateral Malleolus o Silver Alginate - PLEASE ORDER SILVERCEL NON ADHERENT FOR PATIENT Secondary Dressing Wound #1 Left,Lateral Malleolus o Conform/Kerlix o XtraSorb - PLEASE PROVIDE XTRASRB FOR PATIENT R/T MACERATION Dressing Change Frequency Wound #1 Left,Lateral Malleolus o Change  Dressing Monday, Wednesday, Friday Follow-up Appointments Wound #1 Left,Lateral Malleolus o Return Appointment in 2 weeks. Edema Control Wound #1 Left,Lateral Malleolus o Other: - Nunzio CoryubiGrip E Helvey, Nayab T. ([id number]) Home Health Wound #1 Left,Lateral Malleolus o Continue Home Health Visits o Home Health Nurse may visit PRN to address patientos wound care needs. o FACE TO FACE ENCOUNTER: MEDICARE and MEDICAID PATIENTS: I certify that this patient is under my care and that I had a face-to-face encounter that meets the physician face-to-face encounter requirements with this patient on this date. The encounter with the patient was in whole or in part for the following MEDICAL CONDITION: (primary reason for Home Healthcare) MEDICAL NECESSITY: I certify, that based on my findings, NURSING services are a medically necessary home health service. HOME BOUND STATUS: I certify that my clinical findings support that this patient is homebound (iGoode., Due to illness or injury, pt requires aid of supportive devices such as crutches, cane, wheelchairs, walkers, the use of special transportation or the assistance of another person to leave their place of residence. There is a normal inability to leave the home and doing so requires considerable and taxing effort. Other absences are for medical reasons / religious services and are infrequent or of short duration when for other reasons). o If current dressing causes regression in wound condition, may D/C ordered dressing product/s and apply Normal Saline Moist Dressing daily until next Wound Healing Center / Other MD appointment. Notify  Wound Healing Center of regression in wound condition at [phone]. o Please direct any NON-WOUND related issues/requests for orders to patient's Primary Care Physician Electronic Signature(s) Signed: 06/11/2019 4:39:46 PM By: Curtis Sitesorthy, Joanna Signed: 06/12/2019 9:51:47 PM By: Lenda KelpStone III, Hoyt PA-C Entered By: Curtis Sitesorthy, Joanna on 06/11/2019 13:19:30 Danielle OppenheimGRAHAM, [name] ([id number]) -------------------------------------------------------------------------------- Problem List Details Patient Name: [name], Veneta T. Date of Service: 06/11/2019 12:45 PM Medical Record Number: [id number] Patient Account Number: [id number] Date of Birth/Sex: 06/11/1925 (85 yGoodo. F) Treating RN: Curtis Sitesorthy, Joanna Primary Care Provider: Barbette ReichmannHande, Vishwanath Other Clinician: Referring Provider: Barbette ReichmannHande, Vishwanath Treating Provider/Extender: Linwood DibblesSTONE III, HOYT Weeks in Treatment: 8 Active Problems ICD-10 Evaluated Encounter Code Description Active Date Today Diagnosis I73Good89 Other specified peripheral vascular diseases 04/16/2019 No Yes [id number] Non-pressure chronic ulcer of left ankle with fat layer 04/16/2019 No Yes exposed I48Good20 Chronic atrial fibrillation, unspecified 04/16/2019 No Yes I10 Essential (primary) hypertension 04/16/2019 No Yes Inactive Problems Resolved Problems Electronic Signature(s) Signed: 06/12/2019 9:51:47 PM By: Lenda KelpStone III, Hoyt PA-C Entered By: Lenda KelpStone III, Hoyt on 06/11/2019 13:12:43 Danielle OppenheimGRAHAM, [name] ([id number]) -------------------------------------------------------------------------------- Progress Note Details Patient Name: [name], [name] Date of Service: 06/11/2019 12:45 PM Medical Record Number: [id number] Patient Account Number: [id number] Date of Birth/Sex: 12/24/1924 (85 yGoodo. F) Treating RN: Curtis Sitesorthy, Joanna Primary Care Provider: Barbette ReichmannHande, Vishwanath Other Clinician: Referring Provider: Barbette ReichmannHande, Vishwanath Treating Provider/Extender: Linwood DibblesSTONE III, HOYT Weeks in Treatment: 8 Subjective Chief  Complaint Information obtained from Patient Left lateral ankle ulcer History of Present Illness (HPI) 04/16/19 patient presents today for initial evaluation our clinic concerning an issue with her left lateral ankle which has been present for roughly 7 years. She intermittently has had issues with infection unfortunately. She's also been on IV antibiotics back in August/September 2019. She has not had a positive test for osteomyelitis up to this point in her last MRI which was February 2019 was negative. She has had private work on her lower extremities having had a fem-pop bypass of the lower extremity on the left. She subsequently has had angioplasty as well as  stenting of this extremity. Nonetheless overall it appears that she's had quite a bit of extensive work. Currently this healed in January 2020 when she was being seen at the [facility] wound care center. Subsequently it unfortunately reopened she tells me in March and the patient's daughter who was present during the evaluation concurs. The patient is a previous smoker she is on Plavix currently. She has no history of diabetes. We were unable to obtain an ABI due to what appears to be poor flowing to left lower extremity currently. No fevers, chills, nausea, or vomiting noted at this time. 04/30/19 on evaluation today patient actually appears to be doing fairly well in regard to her ulcer in my pinion. In fact I feel like she's showing some signs of improvement at this time which is excellent news despite the fact that were still waiting the vascular appointment. That's in just one weeks time. She has the appointment vascular on June 3 which is next Wednesday. Overall I feel like that's there's no signs of active infection at this time which is good news. I did review her culture which showed positive for Pseudomonas but again I feel like this is likely secondary to just normal skin contamination especially considering the fact that her wound is  doing so well currently. Therefore I'm not going to initiate any different therapy at this point with regard to the culture results if anything changes then I will keep this in mind. 05/07/19 on evaluation today patient appears to be doing well in regard to her left lateral ankle ulcer. She is having a lot of drainage she did see her vascular surgeon and fortunately everything seems to be doing okay in that regard. I do not have that note for review yet we look to not they but unfortunately the note is not present for review as of yet. Nonetheless the patient's daughter is patient with her during the visit today and states that they were advised by the physician and vascular that there really was not anything to intervene with at this point and that all of the testing looks okay and does not appear to change from the prior testing. This is good news and hopefully means that the patient will have no difficulty healing with the appropriate dressing changes and interventions. Fortunately there's no signs of active infection at this time. 05/14/19 on evaluation today patient actually appears to be showing some signs of improvement at this point which is good news. Fortunately there's no signs of active infection at this time. She's been tolerating the dressing changes without complication including the Tubigrip. Overall very pleased with the progress. 05/21/19 on evaluation today patient actually appears to be showing some signs of improvement which is good news in regard to her lower extremity ulcer. Overall she still has a bit of drainage but the barrier cream does seem to have been beneficial for her. Fortunately there's no signs of active infection at this time which is good news. No fevers, chills, nausea, or vomiting noted at this time. 06/11/19 on evaluation today patient actually appears to be making some progress in regard to her ulcer on the left lateral malleolus. She is still having some pain but  nothing insignificant is what she was previously experiencing. Overall I feel like she's. She is headed in the right direction. [name], [name] ([id number]) Patient History Information obtained from Patient. Family History Heart Disease - Father, Hypertension - Father, No family history of Cancer, Diabetes, Hereditary Spherocytosis, Kidney Disease, Lung  Disease, Seizures, Stroke, Thyroid Problems, Tuberculosis. Social History Former smoker - ended on 12/02/2018, Marital Status - Widowed, Alcohol Use - Never, Drug Use - No History. Medical History Eyes Denies history of Cataracts, Glaucoma, Optic Neuritis Ear/Nose/Mouth/Throat Denies history of Chronic sinus problems/congestion, Middle ear problems Hematologic/Lymphatic Denies history of Anemia, Hemophilia, Human Immunodeficiency Virus, Lymphedema, Sickle Cell Disease Respiratory Denies history of Aspiration, Asthma, Chronic Obstructive Pulmonary Disease (COPD), Pneumothorax, Sleep Apnea, Tuberculosis Cardiovascular Patient has history of Arrhythmia - A Fib, Tachycardia, palpitations, Hypertension, Peripheral Venous Disease Endocrine Denies history of Type I Diabetes, Type II Diabetes Genitourinary Denies history of End Stage Renal Disease Integumentary (Skin) Patient has history of History of pressure wounds - Current wound 2013 Denies history of History of Burn Musculoskeletal Denies history of Gout Oncologic Denies history of Received Chemotherapy, Received Radiation Hospitalization/Surgery History - Abdominal hysterectomy. - Angiogram 12/15/12,09/07/14. - Angiogram 05/2016. - Angioplasty / Stenting iliac. - Colonoscopy 2001. - Pulmonary angioplasty 05/15/2017. Medical And Surgical History Notes Musculoskeletal Degenerative joint disease, Degenerative arthritis. osteoporosis Review of Systems (ROS) Constitutional Symptoms (General Health) Denies complaints or symptoms of Fatigue, Fever, Chills, Marked Weight  Change. Respiratory Denies complaints or symptoms of Chronic or frequent coughs, Shortness of Breath. Cardiovascular Denies complaints or symptoms of Chest pain, LE edema. Psychiatric Denies complaints or symptoms of Anxiety, Claustrophobia. [name], Danielle Marland Kitchen. ([id number]) Objective Constitutional Well-nourished and well-hydrated in no acute distress. Vitals Time Taken: 12:57 PM, Height: 59 in, Weight: 98 lbs, BMI: 19Good8, Temperature: 97Good9 F, Pulse: 80 bpm, Respiratory Rate: 20 breaths/min, Blood Pressure: 118/69 mmHg. Respiratory normal breathing without difficulty. clear to auscultation bilaterally. Cardiovascular regular rate and rhythm with normal S1, S2. Psychiatric this patient is able to make decisions and demonstrates good insight into disease process. Alert and Oriented x 3. pleasant and cooperative. General Notes: Patient's wound bed currently showed good granulation with no significant Slough buildup and good signs of epithelialization around different areas of the wound. Overall very pleased with how things seem to be progressing. The patient's daughter is likewise pleased as is the patient. Integumentary (Hair, Skin) Wound #1 status is Open. Original cause of wound was Gradually Appeared. The wound is located on the Left,Lateral Malleolus. The wound measures 3Good7cm length x 3Good3cm width x 0Good2cm depth; 9Good59cm^2 area and 1Good918cm^3 volume. There is Fat Layer (Subcutaneous Tissue) Exposed exposed. There is no tunneling or undermining noted. There is a small amount of serous drainage noted. The wound margin is flat and intact. There is medium (34-66%) red, hyper - granulation within the wound bed. There is a medium (34-66%) amount of necrotic tissue within the wound bed including Adherent Slough. Assessment Active Problems ICD-10 Other specified peripheral vascular diseases Non-pressure chronic ulcer of left ankle with fat layer exposed Chronic atrial fibrillation,  unspecified Essential (primary) hypertension Plan Wound Cleansing: Wound #1 Left,Lateral Malleolus: Clean wound with Normal Saline. Anesthetic (add to Medication List): Wound #1 Left,Lateral Malleolus: Topical Lidocaine 4% cream applied to wound bed prior to debridement (In Clinic Only). Danielle OppenheimGRAHAM, [name] ([id number]) Skin Barriers/Peri-Wound Care: Wound #1 Left,Lateral Malleolus: Barrier cream - to peri wound Primary Wound Dressing: Wound #1 Left,Lateral Malleolus: Silver Alginate - PLEASE ORDER SILVERCEL NON ADHERENT FOR PATIENT Secondary Dressing: Wound #1 Left,Lateral Malleolus: Conform/Kerlix XtraSorb - PLEASE PROVIDE XTRASRB FOR PATIENT R/T MACERATION Dressing Change Frequency: Wound #1 Left,Lateral Malleolus: Change Dressing Monday, Wednesday, Friday Follow-up Appointments: Wound #1 Left,Lateral Malleolus: Return Appointment in 2 weeks. Edema Control: Wound #1 Left,Lateral Malleolus: Other: - TubiGrip E Home Health: Wound #1  Left,Lateral Malleolus: [place] Nurse may visit PRN to address patient s wound care needs. FACE TO FACE ENCOUNTER: MEDICARE and MEDICAID PATIENTS: I certify that this patient is under my care and that I had a face-to-face encounter that meets the physician face-to-face encounter requirements with this patient on this date. The encounter with the patient was in whole or in part for the following MEDICAL CONDITION: (primary reason for [place]) MEDICAL NECESSITY: I certify, that based on my findings, NURSING services are a medically necessary home health service. HOME BOUND STATUS: I certify that my clinical findings support that this patient is homebound (iGoode., Due to illness or injury, pt requires aid of supportive devices such as crutches, cane, wheelchairs, walkers, the use of special transportation or the assistance of another person to leave their place of residence. There is a normal inability to leave  the home and doing so requires considerable and taxing effort. Other absences are for medical reasons / religious services and are infrequent or of short duration when for other reasons). If current dressing causes regression in wound condition, may D/C ordered dressing product/s and apply Normal Saline Moist Dressing daily until next [place] / Other MD appointment. [place] of regression in wound condition at [phone]. Please direct any NON-WOUND related issues/requests for orders to patient's Primary Care Physician My suggestion is gonna be that we go ahead and continue with the above wound care measures for the next week and the patient is in agreement the plan as is her daughter. We will see were things stand at follow-up. If anything changes or worsens the meantime they will contact the office let me know. Please see above for specific wound care orders. We will see patient for re-evaluation in 2 week(s) here in the clinic. If anything worsens or changes patient will contact our office for additional recommendations. Electronic Signature(s) Signed: 06/12/2019 9:51:47 PM By: Worthy Keeler PA-C Entered By: Worthy Keeler on 06/12/2019 21:39:32 [name] ([id number]) -------------------------------------------------------------------------------- ROS/PFSH Details Patient Name: Danielle Charters T. Date of Service: 06/11/2019 12:45 PM Medical Record Number: [id number] Patient Account Number: [id number] Date of Birth/Sex: 06-30-1925 (85 yGoodo. F) Treating RN: Montey Hora Primary Care Provider: Tracie Harrier Other Clinician: Referring Provider: Tracie Harrier Treating Provider/Extender: Melburn Hake, HOYT Weeks in Treatment: 8 Information Obtained From Patient Constitutional Symptoms (General Health) Complaints and Symptoms: Negative for: Fatigue; Fever; Chills; Marked Weight Change Respiratory Complaints and Symptoms: Negative for:  Chronic or frequent coughs; Shortness of Breath Medical History: Negative for: Aspiration; Asthma; Chronic Obstructive Pulmonary Disease (COPD); Pneumothorax; Sleep Apnea; Tuberculosis Cardiovascular Complaints and Symptoms: Negative for: Chest pain; LE edema Medical History: Positive for: Arrhythmia - A Fib, Tachycardia, palpitations; Hypertension; Peripheral Venous Disease Psychiatric Complaints and Symptoms: Negative for: Anxiety; Claustrophobia Eyes Medical History: Negative for: Cataracts; Glaucoma; Optic Neuritis Ear/Nose/Mouth/Throat Medical History: Negative for: Chronic sinus problems/congestion; Middle ear problems Hematologic/Lymphatic Medical History: Negative for: Anemia; Hemophilia; Human Immunodeficiency Virus; Lymphedema; Sickle Cell Disease Endocrine Medical History: Negative for: Type I Diabetes; Type II Diabetes [name], [name] ([id number]) Genitourinary Medical History: Negative for: End Stage Renal Disease Integumentary (Skin) Medical History: Positive for: History of pressure wounds - Current wound 2013 Negative for: History of Burn Musculoskeletal Medical History: Negative for: Gout Past Medical History Notes: Degenerative joint disease, Degenerative arthritis. osteoporosis Oncologic Medical History: Negative for: Received Chemotherapy; Received Radiation Immunizations Pneumococcal Vaccine: Received Pneumococcal Vaccination: Yes Implantable Devices No devices added Hospitalization / Surgery History  Type of Hospitalization/Surgery Abdominal hysterectomy Angiogram 12/15/12,09/07/14 Angiogram 05/2016 Angioplasty / Stenting iliac Colonoscopy 2001 Pulmonary angioplasty 05/15/2017 Family and Social History Cancer: No; Diabetes: No; Heart Disease: Yes - Father; Hereditary Spherocytosis: No; Hypertension: Yes - Father; Kidney Disease: No; Lung Disease: No; Seizures: No; Stroke: No; Thyroid Problems: No; Tuberculosis: No; Former smoker - ended on  12/02/2018; Marital Status - Widowed; Alcohol Use: Never; Drug Use: No History; Financial Concerns: No; Food, Clothing or Shelter Needs: No; Support System Lacking: No; Transportation Concerns: No Physician Affirmation I have reviewed and agree with the above information. Electronic Signature(s) Signed: 06/12/2019 9:51:47 PM By: Lenda KelpStone III, Hoyt PA-C Signed: 06/14/2019 4:55:00 PM By: Curtis Sitesorthy, Joanna Entered By: Lenda KelpStone III, Hoyt on 06/12/2019 21:39:04 Danielle OppenheimGRAHAM, Magali T. ([id number]) -------------------------------------------------------------------------------- SuperBill Details Patient Name: [name], Aubrey T. Date of Service: 06/11/2019 Medical Record Number: [id number] Patient Account Number: [id number] Date of Birth/Sex: 04/23/1925 (85 yGoodo. 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 I48Good20 Chronic atrial fibrillation, unspecified I10 Essential (primary) hypertension Facility Procedures CPT4 Code: [id number] Description: 99213 - WOUND CARE VISIT-LEV 3 EST PT Modifier: Quantity: 1 Physician Procedures CPT4 Code: [id number] Description: 99214 - WC PHYS LEVEL 4 - EST PT ICD-10 Diagnosis Description [id number] Other specified peripheral vascular diseases [id number] 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

## 2019-06-25 ENCOUNTER — Encounter: Payer: Medicare Other | Admitting: Physician Assistant

## 2019-06-25 ENCOUNTER — Other Ambulatory Visit: Payer: Self-pay

## 2019-06-25 DIAGNOSIS — L97322 Non-pressure chronic ulcer of left ankle with fat layer exposed: Secondary | ICD-10-CM | POA: Diagnosis not present

## 2019-06-25 NOTE — Progress Notes (Addendum)
[name], [name] ([id number]) Visit Report for 06/25/2019 Arrival Information Details Patient Name: [name], [name] Date of Service: 06/25/2019 12:45 PM Medical Record Number: [id number] Patient Account Number: [id number] Date of Birth/Sex: 12/09/1924 (85 y.o. F) Treating RN: [name], Good Primary Care Henri Baumler: [name] Other Clinician: Referring [name]: [name] Treating [name]: [name] Weeks in Treatment: 10 Visit Information History Since Last Visit Added or deleted any medications: No Patient Arrived: Wheel Chair Any new allergies or adverse reactions: No Arrival Time: 13:03 Had a fall or experienced change in No activities of daily living that may affect Accompanied Good: daughter risk of falls: Transfer Assistance: None Signs or symptoms of abuse/neglect since last visito No Patient Requires Transmission-Based No Hospitalized since last visit: No Precautions: Has Dressing in Place as Prescribed: Yes Pain Present Now: No Electronic Signature(s) Signed: 06/25/2019 3:54:07 PM Good: [name], Good Entered Good: [name], Good on 06/25/2019 13:04:09 [name], [name] ([id number]) -------------------------------------------------------------------------------- Clinic Level of Care Assessment Details Patient Name: [name], [name] Date of Service: 06/25/2019 12:45 PM Medical Record Number: [id number] Patient Account Number: [id number] Date of Birth/Sex: 05/29/1925 (85 y.o. F) Treating RN: [name], Danielle Primary Care Brooklyne Radke: [name] Other Clinician: Referring Tatyanna Cronk: [name] Treating Ladasha Schnackenberg/Extender: [name] Weeks in Treatment: 10 Clinic Level of Care Assessment Items TOOL 4 Quantity Score []  - Use when only an EandM is performed on FOLLOW-UP visit 0 ASSESSMENTS - Nursing Assessment / Reassessment X - Reassessment of Co-morbidities (includes updates in patient status) 1 10 X- 1 5 Reassessment of Adherence to  Treatment Plan ASSESSMENTS - Wound and Skin Assessment / Reassessment X - Simple Wound Assessment / Reassessment - one wound 1 5 []  - 0 Complex Wound Assessment / Reassessment - multiple wounds []  - 0 Dermatologic / Skin Assessment (not related to wound area) ASSESSMENTS - Focused Assessment []  - Circumferential Edema Measurements - multi extremities 0 []  - 0 Nutritional Assessment / Counseling / Intervention X- 1 5 Lower Extremity Assessment (monofilament, tuning fork, pulses) []  - 0 Peripheral Arterial Disease Assessment (using hand held doppler) ASSESSMENTS - Ostomy and/or Continence Assessment and Care []  - Incontinence Assessment and Management 0 []  - 0 Ostomy Care Assessment and Management (repouching, etc.) PROCESS - Coordination of Care X - Simple Patient / Family Education for ongoing care 1 15 []  - 0 Complex (extensive) Patient / Family Education for ongoing care X- 1 10 Staff obtains ChiropractorConsents, Records, Test Results / Process Orders []  - 0 Staff telephones HHA, Nursing Homes / Clarify orders / etc []  - 0 Routine Transfer to another Facility (non-emergent condition) []  - 0 Routine Hospital Admission (non-emergent condition) []  - 0 New Admissions / Manufacturing engineernsurance Authorizations / Ordering NPWT, Apligraf, etc. []  - 0 Emergency Hospital Admission (emergent condition) X- 1 10 Simple Discharge Coordination [name], [name] ([id number]) []  - 0 Complex (extensive) Discharge Coordination PROCESS - Special Needs []  - Pediatric / Minor Patient Management 0 []  - 0 Isolation Patient Management []  - 0 Hearing / Language / Visual special needs []  - 0 Assessment of Community assistance (transportation, D/C planning, etc.) []  - 0 Additional assistance / Altered mentation []  - 0 Support Surface(s) Assessment (bed, cushion, seat, etc.) INTERVENTIONS - Wound Cleansing / Measurement X - Simple Wound Cleansing - one wound 1 5 []  - 0 Complex Wound Cleansing - multiple wounds X-  1 5 Wound Imaging (photographs - any number of wounds) []  - 0 Wound Tracing (instead of photographs) X- 1 5 Simple Wound Measurement - one  wound []  - 0 Complex Wound Measurement - multiple wounds INTERVENTIONS - Wound Dressings X - Small Wound Dressing one or multiple wounds 1 10 []  - 0 Medium Wound Dressing one or multiple wounds []  - 0 Large Wound Dressing one or multiple wounds []  - 0 Application of Medications - topical []  - 0 Application of Medications - injection INTERVENTIONS - Miscellaneous []  - External ear exam 0 []  - 0 Specimen Collection (cultures, biopsies, blood, body fluids, etc.) []  - 0 Specimen(s) / Culture(s) sent or taken to Lab for analysis []  - 0 Patient Transfer (multiple staff / Nurse, adultHoyer Lift / Similar devices) []  - 0 Simple Staple / Suture removal (25 or less) []  - 0 Complex Staple / Suture removal (26 or more) []  - 0 Hypo / Hyperglycemic Management (close monitor of Blood Glucose) []  - 0 Ankle / Brachial Index (ABI) - do not check if billed separately X- 1 5 Vital Signs [name], [name] ([id number]) Has the patient been seen at the hospital within the last three years: Yes Total Score: 90 Level Of Care: New/Established - Level 3 Electronic Signature(s) Signed: 06/25/2019 4:09:36 PM Good: [name], [name]: [name], Danielle on 06/25/2019 13:37:16 [name], [name] ([id number]) -------------------------------------------------------------------------------- Encounter Discharge Information Details Patient Name: [name], [name] Date of Service: 06/25/2019 12:45 PM Medical Record Number: [id number] Patient Account Number: [id number] Date of Birth/Sex: 09/03/1925 (85 y.o. F) Treating RN: [name], Danielle Primary Care Yanis Larin: [name] Other Clinician: Referring Pauline Trainer: [name] Treating Velinda Wrobel/Extender: [name] Weeks in Treatment: 10 Encounter Discharge Information Items Discharge Condition: Stable Ambulatory  Status: Wheelchair Discharge Destination: Home Transportation: Private Auto Accompanied Good: daughter Schedule Follow-up Appointment: Yes Clinical Summary of Care: Electronic Signature(s) Signed: 06/25/2019 4:09:36 PM Good: [name], [name]: [name], Danielle on 06/25/2019 13:41:03 [name], [name] ([id number]) -------------------------------------------------------------------------------- Lower Extremity Assessment Details Patient Name: [name], [name] Date of Service: 06/25/2019 12:45 PM Medical Record Number: [id number] Patient Account Number: [id number] Date of Birth/Sex: 08/29/1925 (85 y.o. F) Treating RN: [name], Good Primary Care Camerin Jimenez: [name] Other Clinician: Referring Adrianne Shackleton: [name] Treating Anelisse Jacobson/Extender: STONE III, Good Weeks in Treatment: 10 Edema Assessment Assessed: [Left: No] [Right: No] Edema: [Left: N] [Right: o] Vascular Assessment Pulses: Dorsalis Pedis Palpable: [Left:Yes] Electronic Signature(s) Signed: 06/25/2019 3:54:07 PM Good: [name], Good Entered Good: [name], Good on 06/25/2019 13:13:02 [name], [name] ([id number]) -------------------------------------------------------------------------------- Multi Wound Chart Details Patient Name: [name], Laxmi T. Date of Service: 06/25/2019 12:45 PM Medical Record Number: [id number] Patient Account Number: [id number] Date of Birth/Sex: 12/29/1924 (85 y.o. F) Treating RN: [name], Danielle Primary Care Adelheid Hoggard: [name] Other Clinician: Referring Kymari Lollis: [name] Treating Shaterrica Territo/Extender: [name] Weeks in Treatment: 10 Vital Signs Height(in): 59 Pulse(bpm): 70 Weight(lbs): 98 Blood Pressure(mmHg): 116/63 Body Mass Index(BMI): 20 Temperature(F): 97.8 Respiratory Rate 16 (breaths/min): Photos: [N/A:N/A] Wound Location: Left Malleolus - Lateral N/A N/A Wounding Event: Gradually Appeared N/A N/A Primary Etiology: Arterial Insufficiency Ulcer  N/A N/A Comorbid History: Arrhythmia, Hypertension, N/A N/A Peripheral Venous Disease, History of pressure wounds Date Acquired: 01/31/2019 N/A N/A Weeks of Treatment: 10 N/A N/A Wound Status: Open N/A N/A Clustered Wound: Yes N/A N/A Clustered Quantity: 4 N/A N/A Measurements L x W x D 6x4.3x0.2 N/A N/A (cm) Area (cm) : 20.263 N/A N/A Volume (cm) : 4.053 N/A N/A % Reduction in Area: -334.40% N/A N/A % Reduction in Volume: -334.40% N/A N/A Classification: Full Thickness Without N/A N/A Exposed Support Structures Exudate Amount: Medium N/A N/A Exudate Type: Serous N/A N/A  Exudate Color: amber N/A N/A Wound Margin: Flat and Intact N/A N/A Granulation Amount: Large (67-100%) N/A N/A Granulation Quality: Red, Hyper-granulation N/A N/A Necrotic Amount: Small (1-33%) N/A N/A Exposed Structures: Fat Layer (Subcutaneous N/A N/A Tissue) Exposed: Yes Fascia: No CALYN, SIVILS T. ([id number]) Tendon: No Muscle: No Joint: No Bone: No Epithelialization: None N/A N/A Treatment Notes Electronic Signature(s) Signed: 06/25/2019 4:09:36 PM Good: Montey Hora Entered Good: Montey Hora on 06/25/2019 13:36:01 Newton Pigg ([id number]) -------------------------------------------------------------------------------- [place] Details Patient Name: [name] Date of Service: 06/25/2019 12:45 PM Medical Record Number: [id number] Patient Account Number: [id number] Date of Birth/Sex: 1925-02-17 (85 y.o. F) Treating RN: Montey Hora Primary Care Urijah Raynor: Tracie Harrier Other Clinician: Referring Payslie Mccaig: Tracie Harrier Treating Janthony Holleman/Extender: Melburn Hake, Good Weeks in Treatment: 10 Active Inactive Electronic Signature(s) Signed: 07/07/2019 4:57:03 PM Good: Gretta Cool, BSN, RN, CWS, Kim RN, BSN Signed: 07/30/2019 1:02:42 PM Good: Montey Hora Previous Signature: 06/25/2019 4:09:36 PM Version Good: Montey Hora Entered Good: Gretta Cool BSN, RN, CWS, Kim on 07/07/2019  16:57:02 Newton Pigg ([id number]) -------------------------------------------------------------------------------- Pain Assessment Details Patient Name: MAUD, RUBENDALL T. Date of Service: 06/25/2019 12:45 PM Medical Record Number: [id number] Patient Account Number: [id number] Date of Birth/Sex: June 25, 1925 (85 y.o. F) Treating RN: Army Melia Primary Care Donterius Filley: Tracie Harrier Other Clinician: Referring Danitra Payano: Tracie Harrier Treating Tramond Slinker/Extender: Melburn Hake, Good Weeks in Treatment: 10 Active Problems Location of Pain Severity and Description of Pain Patient Has Paino No Site Locations Pain Management and Medication Current Pain Management: Electronic Signature(s) Signed: 06/25/2019 3:54:07 PM Good: Army Melia Entered Good: Army Melia on 06/25/2019 13:07:10 Newton Pigg ([id number]) -------------------------------------------------------------------------------- Patient/Caregiver Education Details Patient Name: [name] Date of Service: 06/25/2019 12:45 PM Medical Record Number: [id number] Patient Account Number: [id number] Date of Birth/Gender: 02/27/1925 (85 y.o. F) Treating RN: Montey Hora Primary Care Physician: Tracie Harrier Other Clinician: Referring Physician: Tracie Harrier Treating Physician/Extender: Sharalyn Ink in Treatment: 10 Education Assessment Education Provided To: Patient and Caregiver Education Topics Provided Wound/Skin Impairment: Handouts: Other: wound care as ordered Methods: Demonstration, Explain/Verbal Responses: State content correctly Electronic Signature(s) Signed: 06/25/2019 4:09:36 PM Good: Montey Hora Entered Good: Montey Hora on 06/25/2019 13:37:36 Newton Pigg ([id number]) -------------------------------------------------------------------------------- Wound Assessment Details Patient Name: [name] Date of Service: 06/25/2019 12:45 PM Medical Record Number:  [id number] Patient Account Number: [id number] Date of Birth/Sex: 1925/09/30 (85 y.o. F) Treating RN: Army Melia Primary Care Athalia Setterlund: Tracie Harrier Other Clinician: Referring Shavanna Furnari: Tracie Harrier Treating Russell Engelstad/Extender: Melburn Hake, Good Weeks in Treatment: 10 Wound Status Wound Number: 1 Primary Arterial Insufficiency Ulcer Etiology: Wound Location: Left Malleolus - Lateral Wound Open Wounding Event: Gradually Appeared Status: Date Acquired: 01/31/2019 Comorbid Arrhythmia, Hypertension, Peripheral Venous Weeks Of Treatment: 10 History: Disease, History of pressure wounds Clustered Wound: Yes Photos Wound Measurements Length: (cm) 6 % Reduct Width: (cm) 4.3 % Reduct Depth: (cm) 0.2 Epitheli Clustered Quantity: 4 Tunnelin Area: (cm) 20.263 Undermi Volume: (cm) 4.053 ion in Area: -334.4% ion in Volume: -334.4% alization: None g: No ning: No Wound Description Full Thickness Without Exposed Support Foul Odo Classification: Structures Slough/F Wound Margin: Flat and Intact Exudate Medium Amount: Exudate Type: Serous Exudate Color: amber [name] After Cleansing: No ibrino Yes Wound Bed Granulation Amount: Large (67-100%) Exposed Structure Granulation Quality: Red, Hyper-granulation Fascia Exposed: No Necrotic Amount: Small (1-33%) Fat Layer (Subcutaneous Tissue) Exposed: Yes Necrotic Quality: Adherent Slough Tendon Exposed: No Muscle Exposed: No Joint Exposed: No Bone Exposed: No MAKYNLI, STILLS T. ([id number]) Electronic Signature(s) Signed:  06/25/2019 3:54:07 PM Good: [name], Good Entered Good: [name], Good on 06/25/2019 13:12:27 [name], Velena T. ([id number]) -------------------------------------------------------------------------------- Vitals Details Patient Name: [name], Earlyn T. Date of Service: 06/25/2019 12:45 PM Medical Record Number: [id number] Patient Account Number: [id number] Date of Birth/Sex: 05/01/1925 (85 y.o. F) Treating RN: [name],  Good Primary Care Dennison Mcdaid: [name] Other Clinician: Referring Byron Peacock: [name] Treating Mulki Roesler/Extender: [name] Weeks in Treatment: 10 Vital Signs Time Taken: 13:07 Temperature (F): 97.8 Height (in): 59 Pulse (bpm): 70 Weight (lbs): 98 Respiratory Rate (breaths/min): 16 Body Mass Index (BMI): 19.8 Blood Pressure (mmHg): 116/63 Reference Range: 80 - 120 mg / dl Electronic Signature(s) Signed: 06/25/2019 3:54:07 PM Good: [name], Good Entered Good: [name], Good on 06/25/2019 13:07:26

## 2019-06-25 NOTE — Progress Notes (Signed)
AARYANA, BETKE ([id number]) Visit Report for 06/25/2019 Chief Complaint Document Details Patient Name: [name], [name]. Date of Service: 06/25/2019 12:45 PM Medical Record Number: [id number] Patient Account Number: [id number] Date of Birth/Sex: 1925/05/19 (85 y.Good. F) Treating RN: Montey Hora Primary Care Provider: Tracie Harrier Other Clinician: Referring Provider: Tracie Harrier Treating Provider/Extender: Melburn Hake, HOYT Weeks in Treatment: 10 Information Obtained from: Patient Chief Complaint Left lateral ankle ulcer Electronic Signature(s) Signed: 06/25/2019 4:51:26 PM By: Worthy Keeler PA-C Entered By: Worthy Keeler on 06/25/2019 13:16:26 Newton Pigg ([id number]) -------------------------------------------------------------------------------- HPI Details Patient Name: Danielle Charters T. Date of Service: 06/25/2019 12:45 PM Medical Record Number: [id number] Patient Account Number: [id number] Date of Birth/Sex: [birth year] (18 y.Good. F) Treating RN: Montey Hora Primary Care Provider: Tracie Harrier Other Clinician: Referring Provider: Tracie Harrier Treating Provider/Extender: Melburn Hake, HOYT Weeks in Treatment: 10 History of Present Illness HPI Description: 04/16/19 patient presents today for initial evaluation our clinic concerning an issue with her left lateral ankle which has been present for roughly 7 years. She intermittently has had issues with infection unfortunately. She's also been on IV antibiotics back in August/September 2019. She has not had a positive test for osteomyelitis up to this point in her last MRI which was February 2019 was negative. She has had private work on her lower extremities having had a fem-pop bypass of the lower extremity on the left. She subsequently has had angioplasty as well as stenting of this extremity. Nonetheless overall it appears that she's had quite a bit of extensive work. Currently this healed in January 2020  when she was being seen at the [facility] wound care center. Subsequently it unfortunately reopened she tells me in March and the patient's daughter who was present during the evaluation concurs. The patient is a previous smoker she is on Plavix currently. She has no history of diabetes. We were unable to obtain an ABI due to what appears to be poor flowing to left lower extremity currently. No fevers, chills, nausea, or vomiting noted at this time. 04/30/19 on evaluation today patient actually appears to be doing fairly well in regard to her ulcer in my pinion. In fact I feel like she's showing some signs of improvement at this time which is excellent news despite the fact that were still waiting the vascular appointment. That's in just one weeks time. She has the appointment vascular on June 3 which is next Wednesday. Overall I feel like that's there's no signs of active infection at this time which is good news. I did review her culture which showed positive for Pseudomonas but again I feel like this is likely secondary to just normal skin contamination especially considering the fact that her wound is doing so well currently. Therefore I'm not going to initiate any different therapy at this point with regard to the culture results if anything changes then I will keep this in mind. 05/07/19 on evaluation today patient appears to be doing well in regard to her left lateral ankle ulcer. She is having a lot of drainage she did see her vascular surgeon and fortunately everything seems to be doing okay in that regard. I do not have that note for review yet we look to not they but unfortunately the note is not present for review as of yet. Nonetheless the patient's daughter is patient with her during the visit today and states that they were advised by the physician and vascular that there really was not anything to intervene with at  this point and that all of the testing looks okay and does not appear to change  from the prior testing. This is good news and hopefully means that the patient will have no difficulty healing with the appropriate dressing changes and interventions. Fortunately there's no signs of active infection at this time. 05/14/19 on evaluation today patient actually appears to be showing some signs of improvement at this point which is good news. Fortunately there's no signs of active infection at this time. She's been tolerating the dressing changes without complication including the Tubigrip. Overall very pleased with the progress. 05/21/19 on evaluation today patient actually appears to be showing some signs of improvement which is good news in regard to her lower extremity ulcer. Overall she still has a bit of drainage but the barrier cream does seem to have been beneficial for her. Fortunately there's no signs of active infection at this time which is good news. No fevers, chills, nausea, or vomiting noted at this time. 06/11/19 on evaluation today patient actually appears to be making some progress in regard to her ulcer on the left lateral malleolus. She is still having some pain but nothing insignificant is what she was previously experiencing. Overall I feel like she's. She is headed in the right direction. 06/25/2019 on evaluation today patient's wound actually does not appear to be doing quite as well as previous. She has a small new area which is actually increasing the size of this clustered wound unfortunately. With that being said there is no signs of infection but she still continues to have a lot of drainage. No fever, chills, nausea, vomiting, or diarrhea noted at this point. Electronic Signature(s) [name], [name] ([id number]) Signed: 06/25/2019 1:42:56 PM By: Lenda KelpStone III, Hoyt PA-C Entered By: Lenda KelpStone III, Hoyt on 06/25/2019 13:42:56 [name], [name] ([id number]) -------------------------------------------------------------------------------- Physical Exam  Details Patient Name: [name], [name] Date of Service: 06/25/2019 12:45 PM Medical Record Number: [id number] Patient Account Number: [id number] Date of Birth/Sex: 05/22/1925 (85 y.Good. F) Treating RN: Curtis Sitesorthy, Joanna Primary Care Provider: Barbette ReichmannHande, Vishwanath Other Clinician: Referring Provider: Barbette ReichmannHande, Vishwanath Treating Provider/Extender: STONE III, HOYT Weeks in Treatment: 10 Constitutional Well-nourished and well-hydrated in no acute distress. Respiratory normal breathing without difficulty. clear to auscultation bilaterally. Cardiovascular regular rate and rhythm with normal S1, S2. Psychiatric this patient is able to make decisions and demonstrates good insight into disease process. Alert and Oriented x 3. pleasant and cooperative. Notes On inspection today patient's wound bed actually showed signs of good granulation at this time there was minimal slough noted on the surface of the wound I was able to gently clean this off mechanically with saline and gauze. Fortunately there is no signs of active infection otherwise. She does have some discomfort with this. Electronic Signature(s) Signed: 06/25/2019 1:43:58 PM By: Lenda KelpStone III, Hoyt PA-C Entered By: Lenda KelpStone III, Hoyt on 06/25/2019 13:43:57 [name], [name] ([id number]) -------------------------------------------------------------------------------- Physician Orders Details Patient Name: [name], [name] Date of Service: 06/25/2019 12:45 PM Medical Record Number: [id number] Patient Account Number: [id number] Date of Birth/Sex: 03/04/1925 (85 y.Good. F) Treating RN: Curtis Sitesorthy, Joanna Primary Care Provider: Barbette ReichmannHande, Vishwanath Other Clinician: Referring Provider: Barbette ReichmannHande, Vishwanath Treating Provider/Extender: Linwood DibblesSTONE III, HOYT Weeks in Treatment: 10 Verbal / Phone Orders: No Diagnosis Coding ICD-10 Coding Code Description I73.89 Other specified peripheral vascular diseases L97.322 Non-pressure chronic ulcer of left ankle with fat layer  exposed I48.20 Chronic atrial fibrillation, unspecified I10 Essential (primary) hypertension Wound Cleansing Wound #1 Left,Lateral Malleolus Good Clean wound with Normal Saline. Anesthetic (  add to Medication List) Wound #1 Left,Lateral Malleolus Good Topical Lidocaine 4% cream applied to wound bed prior to debridement (In Clinic Only). Skin Barriers/Peri-Wound Care Wound #1 Left,Lateral Malleolus Good Barrier cream - to peri wound Primary Wound Dressing Wound #1 Left,Lateral Malleolus Good Hydrafera Blue Ready Transfer - Please order the ready transfer specifically for patient Secondary Dressing Wound #1 Left,Lateral Malleolus Good Conform/Kerlix Good XtraSorb - PLEASE PROVIDE XTRASRB FOR PATIENT R/T MACERATION Dressing Change Frequency Wound #1 Left,Lateral Malleolus Good Change Dressing Monday, Wednesday, Friday Follow-up Appointments Wound #1 Left,Lateral Malleolus Good Return Appointment in 2 weeks. Edema Control Wound #1 Left,Lateral Malleolus Good Other: - Nunzio CoryubiGrip E [name], [name] ([id number]) Home Health Wound #1 Left,Lateral Malleolus Good Continue Home Health Visits Good Home Health Nurse may visit PRN to address patientos wound care needs. Good FACE TO FACE ENCOUNTER: MEDICARE and MEDICAID PATIENTS: I certify that this patient is under my care and that I had a face-to-face encounter that meets the physician face-to-face encounter requirements with this patient on this date. The encounter with the patient was in whole or in part for the following MEDICAL CONDITION: (primary reason for Home Healthcare) MEDICAL NECESSITY: I certify, that based on my findings, NURSING services are a medically necessary home health service. HOME BOUND STATUS: I certify that my clinical findings support that this patient is homebound (i.e., Due to illness or injury, pt requires aid of supportive devices such as crutches, cane, wheelchairs, walkers, the use of special transportation or  the assistance of another person to leave their place of residence. There is a normal inability to leave the home and doing so requires considerable and taxing effort. Other absences are for medical reasons / religious services and are infrequent or of short duration when for other reasons). Good If current dressing causes regression in wound condition, may D/C ordered dressing product/s and apply Normal Saline Moist Dressing daily until next Wound Healing Center / Other MD appointment. Notify Wound Healing Center of regression in wound condition at [phone]. Good Please direct any NON-WOUND related issues/requests for orders to patient's Primary Care Physician Electronic Signature(s) Signed: 06/25/2019 4:09:36 PM By: Curtis Sitesorthy, Joanna Signed: 06/25/2019 4:51:26 PM By: Lenda KelpStone III, Hoyt PA-C Entered By: Curtis Sitesorthy, Joanna on 06/25/2019 13:40:10 [name], Tanicka T. ([id number]) -------------------------------------------------------------------------------- Problem List Details Patient Name: [name], Kenna T. Date of Service: 06/25/2019 12:45 PM Medical Record Number: [id number] Patient Account Number: [id number] Date of Birth/Sex: 11/15/1925 (85 y.Good. F) Treating RN: Curtis Sitesorthy, Joanna Primary Care Provider: Barbette ReichmannHande, Vishwanath Other Clinician: Referring Provider: Barbette ReichmannHande, Vishwanath Treating Provider/Extender: Linwood DibblesSTONE III, HOYT Weeks in Treatment: 10 Active Problems ICD-10 Evaluated Encounter Code Description Active Date Today Diagnosis I73.89 Other specified peripheral vascular diseases 04/16/2019 No Yes L97.322 Non-pressure chronic ulcer of left ankle with fat layer 04/16/2019 No Yes exposed I48.20 Chronic atrial fibrillation, unspecified 04/16/2019 No Yes I10 Essential (primary) hypertension 04/16/2019 No Yes Inactive Problems Resolved Problems Electronic Signature(s) Signed: 06/25/2019 4:51:26 PM By: Lenda KelpStone III, Hoyt PA-C Entered By: Lenda KelpStone III, Hoyt on 06/25/2019 13:16:12 [name], Vashon T.  ([id number]) -------------------------------------------------------------------------------- Progress Note Details Patient Name: [name], Irvin T. Date of Service: 06/25/2019 12:45 PM Medical Record Number: [id number] Patient Account Number: [id number] Date of Birth/Sex: 04/17/1925 (85 y.Good. F) Treating RN: Curtis Sitesorthy, Joanna Primary Care Provider: Barbette ReichmannHande, Vishwanath Other Clinician: Referring Provider: Barbette ReichmannHande, Vishwanath Treating Provider/Extender: Linwood DibblesSTONE III, HOYT Weeks in Treatment: 10 Subjective Chief Complaint Information obtained from Patient Left lateral ankle ulcer History of Present Illness (HPI) 04/16/19 patient presents today for initial evaluation our clinic concerning  an issue with her left lateral ankle which has been present for roughly 7 years. She intermittently has had issues with infection unfortunately. She's also been on IV antibiotics back in August/September 2019. She has not had a positive test for osteomyelitis up to this point in her last MRI which was February 2019 was negative. She has had private work on her lower extremities having had a fem-pop bypass of the lower extremity on the left. She subsequently has had angioplasty as well as stenting of this extremity. Nonetheless overall it appears that she's had quite a bit of extensive work. Currently this healed in January 2020 when she was being [facility]. Subsequently it unfortunately reopened she tells me in March and the patient's daughter who was present during the evaluation concurs. The patient is a previous smoker she is on Plavix currently. She has no history of diabetes. We were unable to obtain an ABI due to what appears to be poor flowing to left lower extremity currently. No fevers, chills, nausea, or vomiting noted at this time. 04/30/19 on evaluation today patient actually appears to be doing fairly well in regard to her ulcer in my pinion. In fact I feel like she's showing some signs  of improvement at this time which is excellent news despite the fact that were still waiting the vascular appointment. That's in just one weeks time. She has the appointment vascular on June 3 which is next Wednesday. Overall I feel like that's there's no signs of active infection at this time which is good news. I did review her culture which showed positive for Pseudomonas but again I feel like this is likely secondary to just normal skin contamination especially considering the fact that her wound is doing so well currently. Therefore I'm not going to initiate any different therapy at this point with regard to the culture results if anything changes then I will keep this in mind. 05/07/19 on evaluation today patient appears to be doing well in regard to her left lateral ankle ulcer. She is having a lot of drainage she did see her vascular surgeon and fortunately everything seems to be doing okay in that regard. I do not have that note for review yet we look to not they but unfortunately the note is not present for review as of yet. Nonetheless the patient's daughter is patient with her during the visit today and states that they were advised by the physician and vascular that there really was not anything to intervene with at this point and that all of the testing looks okay and does not appear to change from the prior testing. This is good news and hopefully means that the patient will have no difficulty healing with the appropriate dressing changes and interventions. Fortunately there's no signs of active infection at this time. 05/14/19 on evaluation today patient actually appears to be showing some signs of improvement at this point which is good news. Fortunately there's no signs of active infection at this time. She's been tolerating the dressing changes without complication including the Tubigrip. Overall very pleased with the progress. 05/21/19 on evaluation today patient actually appears to  be showing some signs of improvement which is good news in regard to her lower extremity ulcer. Overall she still has a bit of drainage but the barrier cream does seem to have been beneficial for her. Fortunately there's no signs of active infection at this time which is good news. No fevers, chills, nausea,  or vomiting noted at this time. 06/11/19 on evaluation today patient actually appears to be making some progress in regard to her ulcer on the left lateral malleolus. She is still having some pain but nothing insignificant is what she was previously experiencing. Overall I feel like she's. She is headed in the right direction. 06/25/2019 on evaluation today patient's wound actually does not appear to be doing quite as well as previous. She has a Pustejovsky, Wagner T. ([id number]) small new area which is actually increasing the size of this clustered wound unfortunately. With that being said there is no signs of infection but she still continues to have a lot of drainage. No fever, chills, nausea, vomiting, or diarrhea noted at this point. Patient History Information obtained from Patient. Family History Heart Disease - Father, Hypertension - Father, No family history of Cancer, Diabetes, Hereditary Spherocytosis, Kidney Disease, Lung Disease, Seizures, Stroke, Thyroid Problems, Tuberculosis. Social History Former smoker - ended on 12/02/2018, Marital Status - Widowed, Alcohol Use - Never, Drug Use - No History. Medical History Eyes Denies history of Cataracts, Glaucoma, Optic Neuritis Ear/Nose/Mouth/Throat Denies history of Chronic sinus problems/congestion, Middle ear problems Hematologic/Lymphatic Denies history of Anemia, Hemophilia, Human Immunodeficiency Virus, Lymphedema, Sickle Cell Disease Respiratory Denies history of Aspiration, Asthma, Chronic Obstructive Pulmonary Disease (COPD), Pneumothorax, Sleep Apnea, Tuberculosis Cardiovascular Patient has history of Arrhythmia - A Fib,  Tachycardia, palpitations, Hypertension, Peripheral Venous Disease Endocrine Denies history of Type I Diabetes, Type II Diabetes Genitourinary Denies history of End Stage Renal Disease Integumentary (Skin) Patient has history of History of pressure wounds - Current wound 2013 Denies history of History of Burn Musculoskeletal Denies history of Gout Oncologic Denies history of Received Chemotherapy, Received Radiation Hospitalization/Surgery History - Abdominal hysterectomy. - Angiogram 12/15/12,09/07/14. - Angiogram 05/2016. - Angioplasty / Stenting iliac. - Colonoscopy 2001. - Pulmonary angioplasty 05/15/2017. Medical And Surgical History Notes Musculoskeletal Degenerative joint disease, Degenerative arthritis. osteoporosis Review of Systems (ROS) Constitutional Symptoms (General Health) Denies complaints or symptoms of Fatigue, Fever, Chills, Marked Weight Change. Respiratory Denies complaints or symptoms of Chronic or frequent coughs, Shortness of Breath. Cardiovascular Complains or has symptoms of LE edema. Denies complaints or symptoms of Chest pain. Psychiatric Denies complaints or symptoms of Anxiety, Claustrophobia. ROMANA, DEATON TMarland Kitchen ([id number]) Objective Constitutional Well-nourished and well-hydrated in no acute distress. Vitals Time Taken: 1:07 PM, Height: 59 in, Weight: 98 lbs, BMI: 19.8, Temperature: 97.8 F, Pulse: 70 bpm, Respiratory Rate: 16 breaths/min, Blood Pressure: 116/63 mmHg. Respiratory normal breathing without difficulty. clear to auscultation bilaterally. Cardiovascular regular rate and rhythm with normal S1, S2. Psychiatric this patient is able to make decisions and demonstrates good insight into disease process. Alert and Oriented x 3. pleasant and cooperative. General Notes: On inspection today patient's wound bed actually showed signs of good granulation at this time there was minimal slough noted on the surface of the wound I was able to gently  clean this off mechanically with saline and gauze. Fortunately there is no signs of active infection otherwise. She does have some discomfort with this. Integumentary (Hair, Skin) Wound #1 status is Open. Original cause of wound was Gradually Appeared. The wound is located on the Left,Lateral Malleolus. The wound measures 6cm length x 4.3cm width x 0.2cm depth; 20.263cm^2 area and 4.053cm^3 volume. There is Fat Layer (Subcutaneous Tissue) Exposed exposed. There is no tunneling or undermining noted. There is a medium amount of serous drainage noted. The wound margin is flat and intact. There is large (67-100%) red, hyper - granulation  within the wound bed. There is a small (1-33%) amount of necrotic tissue within the wound bed including Adherent Slough. Assessment Active Problems ICD-10 Other specified peripheral vascular diseases Non-pressure chronic ulcer of left ankle with fat layer exposed Chronic atrial fibrillation, unspecified Essential (primary) hypertension Plan Wound Cleansing: ELETHA, CULBERTSON T. ([id number]) Wound #1 Left,Lateral Malleolus: Clean wound with Normal Saline. Anesthetic (add to Medication List): Wound #1 Left,Lateral Malleolus: Topical Lidocaine 4% cream applied to wound bed prior to debridement (In Clinic Only). Skin Barriers/Peri-Wound Care: Wound #1 Left,Lateral Malleolus: Barrier cream - to peri wound Primary Wound Dressing: Wound #1 Left,Lateral Malleolus: Hydrafera Blue Ready Transfer - Please order the ready transfer specifically for patient Secondary Dressing: Wound #1 Left,Lateral Malleolus: Conform/Kerlix XtraSorb - PLEASE PROVIDE XTRASRB FOR PATIENT R/T MACERATION Dressing Change Frequency: Wound #1 Left,Lateral Malleolus: Change Dressing Monday, Wednesday, Friday Follow-up Appointments: Wound #1 Left,Lateral Malleolus: Return Appointment in 2 weeks. Edema Control: Wound #1 Left,Lateral Malleolus: Other: - TubiGrip E Home Health: Wound #1  Left,Lateral Malleolus: Continue Home Health Visits Home Health Nurse may visit PRN to address patient s wound care needs. FACE TO FACE ENCOUNTER: MEDICARE and MEDICAID PATIENTS: I certify that this patient is under my care and that I had a face-to-face encounter that meets the physician face-to-face encounter requirements with this patient on this date. The encounter with the patient was in whole or in part for the following MEDICAL CONDITION: (primary reason for Home Healthcare) MEDICAL NECESSITY: I certify, that based on my findings, NURSING services are a medically necessary home health service. HOME BOUND STATUS: I certify that my clinical findings support that this patient is homebound (i.e., Due to illness or injury, pt requires aid of supportive devices such as crutches, cane, wheelchairs, walkers, the use of special transportation or the assistance of another person to leave their place of residence. There is a normal inability to leave the home and doing so requires considerable and taxing effort. Other absences are for medical reasons / religious services and are infrequent or of short duration when for other reasons). If current dressing causes regression in wound condition, may D/C ordered dressing product/s and apply Normal Saline Moist Dressing daily until next Wound Healing Center / Other MD appointment. Notify Wound Healing Center of regression in wound condition at [phone]. Please direct any NON-WOUND related issues/requests for orders to patient's Primary Care Physician 1. I am in a recommend that we go ahead and switch to a different type of dressing I would recommend Hydrofera Blue for the time being. Since she is not able to use the Tubigrip all the way up due to the vascular mass in the upper portion of her lower leg I am going to suggest that we just switch to stretch netting to hold this in place. 2. With regard to elevation I did discuss with the patient and her  daughter today that the more the patient can elevate her legs the better she really has not been doing a whole lot of that today and I think if she can do more that will definitely help with the drainage. I would recommend that we see her back for reevaluation in 2 weeks time. Both the patient and daughter are in agreement with the plan. If anything changes or worsens in the meantime they will contact the office and let me know. Electronic Signature(s) Signed: 06/25/2019 1:44:43 PM By: Liam Rogers, [place] TMarland Kitchen ([id number]) Entered By: Lenda Kelp on 06/25/2019 13:44:43 Danielle Oppenheim ([id number]) --------------------------------------------------------------------------------  ROS/PFSH Details Patient Name: MARIEA, MCMARTIN. Date of Service: 06/25/2019 12:45 PM Medical Record Number: [id number] Patient Account Number: [id number] Date of Birth/Sex: Mar 13, 1925 (49 y.Good. F) Treating RN: Curtis Sites Primary Care Provider: Barbette Reichmann Other Clinician: Referring Provider: Barbette Reichmann Treating Provider/Extender: Linwood Dibbles, HOYT Weeks in Treatment: 10 Information Obtained From Patient Constitutional Symptoms (General Health) Complaints and Symptoms: Negative for: Fatigue; Fever; Chills; Marked Weight Change Respiratory Complaints and Symptoms: Negative for: Chronic or frequent coughs; Shortness of Breath Medical History: Negative for: Aspiration; Asthma; Chronic Obstructive Pulmonary Disease (COPD); Pneumothorax; Sleep Apnea; Tuberculosis Cardiovascular Complaints and Symptoms: Positive for: LE edema Negative for: Chest pain Medical History: Positive for: Arrhythmia - A Fib, Tachycardia, palpitations; Hypertension; Peripheral Venous Disease Psychiatric Complaints and Symptoms: Negative for: Anxiety; Claustrophobia Eyes Medical History: Negative for: Cataracts; Glaucoma; Optic Neuritis Ear/Nose/Mouth/Throat Medical History: Negative for: Chronic  sinus problems/congestion; Middle ear problems Hematologic/Lymphatic Medical History: Negative for: Anemia; Hemophilia; Human Immunodeficiency Virus; Lymphedema; Sickle Cell Disease Endocrine Medical History: Negative for: Type I Diabetes; Type II Diabetes LUCRETIA, PENDLEY ([id number]) Genitourinary Medical History: Negative for: End Stage Renal Disease Integumentary (Skin) Medical History: Positive for: History of pressure wounds - Current wound 2013 Negative for: History of Burn Musculoskeletal Medical History: Negative for: Gout Past Medical History Notes: Degenerative joint disease, Degenerative arthritis. osteoporosis Oncologic Medical History: Negative for: Received Chemotherapy; Received Radiation Immunizations Pneumococcal Vaccine: Received Pneumococcal Vaccination: Yes Implantable Devices No devices added Hospitalization / Surgery History Type of Hospitalization/Surgery Abdominal hysterectomy Angiogram 12/15/12,09/07/14 Angiogram 05/2016 Angioplasty / Stenting iliac Colonoscopy 2001 Pulmonary angioplasty 05/15/2017 Family and Social History Cancer: No; Diabetes: No; Heart Disease: Yes - Father; Hereditary Spherocytosis: No; Hypertension: Yes - Father; Kidney Disease: No; Lung Disease: No; Seizures: No; Stroke: No; Thyroid Problems: No; Tuberculosis: No; Former smoker - ended on 12/02/2018; Marital Status - Widowed; Alcohol Use: Never; Drug Use: No History; Financial Concerns: No; Food, Clothing or Shelter Needs: No; Support System Lacking: No; Transportation Concerns: No Physician Affirmation I have reviewed and agree with the above information. Electronic Signature(s) Signed: 06/25/2019 4:09:36 PM By: Curtis Sites Signed: 06/25/2019 4:51:26 PM By: Lenda Kelp PA-C Entered By: Lenda Kelp on 06/25/2019 13:43:18 Danielle Oppenheim ([id number]) -------------------------------------------------------------------------------- SuperBill Details Patient Name:  Danielle Coco T. Date of Service: 06/25/2019 Medical Record Number: [id number] Patient Account Number: [id number] Date of Birth/Sex: 01-Jan-1925 (15 y.Good. F) Treating RN: Curtis Sites Primary Care Provider: Barbette Reichmann Other Clinician: Referring Provider: Barbette Reichmann Treating Provider/Extender: Linwood Dibbles, HOYT Weeks in Treatment: 10 Diagnosis Coding ICD-10 Codes Code Description I73.89 Other specified peripheral vascular diseases L97.322 Non-pressure chronic ulcer of left ankle with fat layer exposed I48.20 Chronic atrial fibrillation, unspecified I10 Essential (primary) hypertension Facility Procedures CPT4 Code: 62952841 Description: 99213 - WOUND CARE VISIT-LEV 3 EST PT Modifier: Quantity: 1 Physician Procedures CPT4 Code: 3244010 Description: 99214 - WC PHYS LEVEL 4 - EST PT ICD-10 Diagnosis Description I73.89 Other specified peripheral vascular diseases L97.322 Non-pressure chronic ulcer of left ankle with fat layer ex I48.20 Chronic atrial fibrillation, unspecified I10  Essential (primary) hypertension Modifier: posed Quantity: 1 Electronic Signature(s) Signed: 06/25/2019 1:45:11 PM By: Lenda Kelp PA-C Entered By: Lenda Kelp on 06/25/2019 13:45:11

## 2019-07-09 ENCOUNTER — Ambulatory Visit: Payer: Medicare Other | Admitting: Physician Assistant

## 2019-10-26 ENCOUNTER — Other Ambulatory Visit: Payer: Self-pay

## 2019-10-26 ENCOUNTER — Emergency Department: Payer: Medicare Other

## 2019-10-26 ENCOUNTER — Encounter: Payer: Self-pay | Admitting: Emergency Medicine

## 2019-10-26 ENCOUNTER — Emergency Department
Admission: EM | Admit: 2019-10-26 | Discharge: 2019-10-26 | Disposition: A | Discharge status: Home / Self Care | Payer: Medicare Other | Attending: Emergency Medicine | Admitting: Emergency Medicine

## 2019-10-26 DIAGNOSIS — R0602 Shortness of breath: Secondary | ICD-10-CM | POA: Insufficient documentation

## 2019-10-26 DIAGNOSIS — Z5321 Procedure and treatment not carried out due to patient leaving prior to being seen by health care provider: Secondary | ICD-10-CM | POA: Diagnosis not present

## 2019-10-26 LAB — COMPREHENSIVE METABOLIC PANEL
ALT: 12 U/L (ref 0–44)
AST: 25 U/L (ref 15–41)
Albumin: 4.1 g/dL (ref 3.5–5.0)
Alkaline Phosphatase: 84 U/L (ref 38–126)
Anion gap: 10 (ref 5–15)
BUN: 14 mg/dL (ref 8–23)
CO2: 27 mmol/L (ref 22–32)
Calcium: 9.1 mg/dL (ref 8.9–10.3)
Chloride: 100 mmol/L (ref 98–111)
Creatinine, Ser: 0.99 mg/dL (ref 0.44–1.00)
GFR calc Af Amer: 57 mL/min — ABNORMAL LOW (ref >60)
GFR calc non Af Amer: 49 mL/min — ABNORMAL LOW (ref >60)
Glucose, Bld: 117 mg/dL — ABNORMAL HIGH (ref 70–99)
Potassium: 4 mmol/L (ref 3.5–5.1)
Sodium: 137 mmol/L (ref 135–145)
Total Bilirubin: 1.4 mg/dL — ABNORMAL HIGH (ref 0.3–1.2)
Total Protein: 7.5 g/dL (ref 6.5–8.1)

## 2019-10-26 LAB — CBC
HCT: 46.4 % — ABNORMAL HIGH (ref 36.0–46.0)
Hemoglobin: 15.1 g/dL — ABNORMAL HIGH (ref 12.0–15.0)
MCH: 30.6 pg (ref 26.0–34.0)
MCHC: 32.5 g/dL (ref 30.0–36.0)
MCV: 94.1 fL (ref 80.0–100.0)
Platelets: 185 10*3/uL (ref 150–400)
RBC: 4.93 MIL/uL (ref 3.87–5.11)
RDW: 15 % (ref 11.5–15.5)
WBC: 6.2 10*3/uL (ref 4.0–10.5)
nRBC: 0 % (ref 0.0–0.2)

## 2019-10-26 NOTE — ED Notes (Signed)
Red and blue tubes drawn as well and sent to lab.

## 2019-10-26 NOTE — ED Triage Notes (Signed)
Pt here with c/o shob that began today, is on 2L at home and still feeling like she was congested/needing more air, had htn today for home nurse, appears in NAD but primary Dr sent her here for evaluation.

## 2019-10-26 NOTE — ED Notes (Signed)
Pt called back to exam room. Pt daughter states she is wanting to leave and will follow up with pcp tomorrow. Pt educated of risks of leaving and pt and daughter verbalized understanding.

## 2020-02-20 ENCOUNTER — Emergency Department
Admission: EM | Admit: 2020-02-20 | Discharge: 2020-03-02 | Disposition: E | Payer: Medicare PPO | Attending: Emergency Medicine | Admitting: Emergency Medicine

## 2020-02-20 ENCOUNTER — Other Ambulatory Visit: Payer: Self-pay

## 2020-02-20 DIAGNOSIS — I5032 Chronic diastolic (congestive) heart failure: Secondary | ICD-10-CM | POA: Diagnosis not present

## 2020-02-20 DIAGNOSIS — Z66 Do not resuscitate: Secondary | ICD-10-CM | POA: Diagnosis not present

## 2020-02-20 DIAGNOSIS — R0902 Hypoxemia: Secondary | ICD-10-CM

## 2020-02-20 DIAGNOSIS — Z87891 Personal history of nicotine dependence: Secondary | ICD-10-CM | POA: Diagnosis not present

## 2020-02-20 DIAGNOSIS — I110 Hypertensive heart disease with heart failure: Secondary | ICD-10-CM | POA: Diagnosis not present

## 2020-02-20 DIAGNOSIS — K920 Hematemesis: Secondary | ICD-10-CM | POA: Diagnosis not present

## 2020-02-20 DIAGNOSIS — J690 Pneumonitis due to inhalation of food and vomit: Secondary | ICD-10-CM | POA: Diagnosis not present

## 2020-02-20 MED ORDER — GLYCOPYRROLATE 0.2 MG/ML IJ SOLN
0.2000 mg | INTRAMUSCULAR | Status: DC | PRN
  Filled 2020-02-20: qty 1

## 2020-02-20 MED ORDER — HALOPERIDOL LACTATE 5 MG/ML IJ SOLN
0.5000 mg | INTRAMUSCULAR | Status: DC | PRN
  Administered 2020-02-20: 0.5 mg via INTRAVENOUS

## 2020-02-20 MED ORDER — HALOPERIDOL LACTATE 5 MG/ML IJ SOLN
INTRAMUSCULAR | Status: AC
  Filled 2020-02-20: qty 1

## 2020-02-20 MED ORDER — HALOPERIDOL 0.5 MG PO TABS
0.5000 mg | ORAL_TABLET | ORAL | Status: DC | PRN
  Filled 2020-02-20: qty 1

## 2020-02-20 MED ORDER — HALOPERIDOL LACTATE 2 MG/ML PO CONC
0.5000 mg | ORAL | Status: DC | PRN
  Filled 2020-02-20: qty 0.3

## 2020-02-20 MED ORDER — MORPHINE SULFATE (CONCENTRATE) 10 MG/0.5ML PO SOLN: 10.0000 mg | ORAL | Status: DC | PRN

## 2020-02-20 MED ORDER — ACETAMINOPHEN 325 MG PO TABS: 650.0000 mg | ORAL_TABLET | Freq: Four times a day (QID) | ORAL | Status: DC | PRN

## 2020-02-20 MED ORDER — GLYCOPYRROLATE 1 MG PO TABS
1.0000 mg | ORAL_TABLET | ORAL | Status: DC | PRN
  Filled 2020-02-20: qty 1

## 2020-02-20 MED ORDER — MORPHINE SULFATE (PF) 4 MG/ML IV SOLN
INTRAVENOUS | Status: AC
  Filled 2020-02-20: qty 1

## 2020-02-20 MED ORDER — ONDANSETRON HCL 4 MG/2ML IJ SOLN
4.0000 mg | Freq: Four times a day (QID) | INTRAMUSCULAR | Status: DC | PRN
  Administered 2020-02-20: 4 mg via INTRAVENOUS
  Filled 2020-02-20: qty 2

## 2020-02-20 MED ORDER — MORPHINE SULFATE (PF) 4 MG/ML IV SOLN
4.0000 mg | Freq: Once | INTRAVENOUS | Status: AC
  Administered 2020-02-20: 4 mg via INTRAVENOUS

## 2020-02-20 MED ORDER — GLYCOPYRROLATE 0.2 MG/ML IJ SOLN
0.2000 mg | INTRAMUSCULAR | Status: DC | PRN
  Administered 2020-02-20: 0.2 mg via INTRAVENOUS
  Filled 2020-02-20 (×3): qty 1

## 2020-02-20 MED ORDER — ACETAMINOPHEN 650 MG RE SUPP: 650.0000 mg | Freq: Four times a day (QID) | RECTAL | Status: DC | PRN

## 2020-02-20 MED ORDER — ONDANSETRON 4 MG PO TBDP: 4.0000 mg | ORAL_TABLET | Freq: Four times a day (QID) | ORAL | Status: DC | PRN

## 2020-03-02 NOTE — ED Notes (Signed)
2 gold colored rings with colored stones removed from right hand. 1 gold colored ring with diamond stone and matching band removed from left hand. 2 thin gold bands removed from left hand as well. 6 rings total placed in specimen cup and given to pt's daughter

## 2020-03-02 NOTE — ED Notes (Signed)
Funeral home form given to [facility] RN

## 2020-03-02 NOTE — ED Notes (Signed)
Pt cleaned up and family at bedside.

## 2020-03-02 NOTE — ED Notes (Signed)
Per conversation with daughter, pt DNR at this time. Pt to be made comfortable. Family on the way to see patient.

## 2020-03-02 NOTE — ED Triage Notes (Signed)
Pt comes EMS for fall with bloody emesis. Dark bloody emesis. Pt possibly aspiration. Hx of afib. Hypoxic. Not known diabetic. Pt moaning but not really responding to stimulus.

## 2020-03-02 NOTE — ED Notes (Signed)
Provided pt warm blanket per family's request. PT's 3 children at bedside, needs denied by family at this time

## 2020-03-02 NOTE — ED Notes (Signed)
Pt switched to [place] in mouth per family request.

## 2020-03-02 NOTE — ED Notes (Signed)
Pt's daughter gave permission for this RN to throw pt's pants and socks in the trash

## 2020-03-02 NOTE — Plan of Care (Signed)
Palliative Care Telephone Note  Call received from Dr. Mayford Knife requesting Hospice Services. Family want to take patient home with comfort measures only. I have contacted [facility]ia[facility] and [facility] covering ED at [facility].  Recommend sending patient home with:  1. Roxanol 20mg /ml Take 1/2-78ml Sublingual q43min prn for pain, shortness of breath or distress. Dispense: 30 ml  2. Haldol 2mg /ml Take 78ml q4 hours prn for agitation dispense: 97ml  DME per TOC and Hospice.  0m, DO Palliative Medicine [phone]  Care Coordination-No charge.

## 2020-03-02 NOTE — ED Notes (Signed)
Dr. Mayford Knife at bedside. Time of death announced at 1:16pm

## 2020-03-02 NOTE — ED Provider Notes (Signed)
Time of death called at 1:18 PM with family at the bedside.   Emily Filbert, MD 2020/03/14 1318

## 2020-03-02 NOTE — ED Provider Notes (Signed)
Family continues to want comfort measures only, we have placed palliative care orders.  Family may decide to take her home and let her pass away at home.   Emily Filbert, MD 20-Mar-2020 [phone]

## 2020-03-02 NOTE — ED Notes (Signed)
[facility] contacted [name]) and patient is released to the funeral home. [phone]

## 2020-03-02 NOTE — ED Provider Notes (Signed)
[facility] Emergency Department Provider Note       Time seen: ----------------------------------------- 9:24 AM on Feb 25, 2020 -----------------------------------------  Level V caveat: History/ROS limited by altered mental status I have reviewed the triage vital signs and the nursing notes.  HISTORY   Chief Complaint GI Bleeding   HPI [name] is a 86 y.o. female with a history of atrial fibrillation, GERD, hypertension, peripheral vascular disease who presents to the ED for hematemesis.  Patient comes by EMS for a fall with bloody emesis.  She was noted to be covered in black and bloody material and possibly aspirated.  She was hypoxic on arrival with altered mental status and tachypnea.  Reportedly she is a DNR.  Past Medical History:  Diagnosis Date  . Atrial fibrillation (HCC)   . Degenerative arthritis   . Degenerative joint disease   . Foot ulcer (HCC)    Chronic left ankle ulcer  . GERD (gastroesophageal reflux disease)   . Hypertension   . Osteoporosis   . PAD (peripheral artery disease) (HCC)    s/p bypass and left leg stents  . Peripheral neuropathy     Patient Active Problem List   Diagnosis Date Noted  . Protein-calorie malnutrition, severe 10/28/2018  . Acute on chronic heart failure (HCC) 10/27/2018  . Degenerative arthritis 07/21/2018  . GERD (gastroesophageal reflux disease) 07/21/2018  . Hypertension 07/21/2018  . Cellulitis of left lower extremity 06/20/2018  . Flexion contracture of joint of lower leg, left 02/27/2017  . Gait instability 02/27/2017  . Traumatic seroma of left lower leg (HCC) 02/27/2017  . Chronic diastolic heart failure (HCC) 11/06/2015  . Bradycardia 11/06/2015  . Atrial fibrillation (HCC) 11/06/2015  . Lump of skin of lower extremity 11/06/2015  . Seasonal allergies 11/06/2015  . Pressure ulcer 10/24/2015  . Irritable bowel syndrome 06/09/2015  . Osteoporosis 06/09/2015  . Ischemic leg ulcer  (HCC) 09/13/2014  . Cellulitis and abscess of foot, except toes 05/25/2014  . Critical lower limb ischemia 05/25/2014  . Pain in limb 12/14/2013  . Ulcer of calf (HCC) 11/10/2013    Past Surgical History:  Procedure Laterality Date  . ABDOMINAL HYSTERECTOMY    . Femoral bypass surgery     left leg  . STENT PLACEMENT ILIAC ([facility] HX)     Left leg  . TONSILLECTOMY  age 3    Allergies Alendronate, Doxycycline, Minocycline, Oxycodone, and Sulfa antibiotics  Social History Social History   Tobacco Use  . Smoking status: Former Games developer  . Smokeless tobacco: Never Used  . Tobacco comment: Quit 10 years ago  Substance Use Topics  . Alcohol use: No    Alcohol/week: 0.0 standard drinks  . Drug use: Not on file    Review of Systems Unknown, reported fall, bloody emesis, altered mental status  All systems negative/normal/unremarkable except as stated in the HPI  ____________________________________________   PHYSICAL EXAM:  VITAL SIGNS: ED Triage Vitals  Enc Vitals Group     BP Feb 25, 2020 0921 (!) 115/94     Pulse Rate 25-Feb-2020 0918 (!) 135     Resp 02-25-2020 0918 (!) 30     Temp --      Temp src --      SpO2 02/25/20 0923 (!) 82 %     Weight 2020/02/25 0917 90 lb (40.8 kg)     Height 02-25-2020 0917 4\' 11"  (1.499 m)     Head Circumference --      Peak Flow --  Pain Score 08-Mar-2020 0917 0     Pain Loc --      Pain Edu? --      Excl. in [place]? --     Constitutional: Patient is not alert, moaning, covered in black appearing emesis.  Severe distress on arrival Eyes: Conjunctivae are very pale ENT      Head: Normocephalic and atraumatic.      Nose: No congestion/rhinnorhea.      Mouth/Throat: Mucous membranes are moist. Cardiovascular: Rapid rate, regular rhythm. No murmurs, rubs, or gallops. Respiratory: Tachypnea with rhonchi bilaterally Gastrointestinal: Soft and nontender. Musculoskeletal: Limited range of motion of the extremities, flexion contracture and mass  around the left calf Neurologic: Patient is moaning, does not follow commands, GCS around 10, eyes are open spontaneously with incomprehensible sounds and she does withdraw from pain at this point Skin: Pallor is noted Psychiatric: Unable to assess. ____________________________________________  ED COURSE:  As part of my medical decision making, I reviewed the following data within the [place] History obtained from family if available, nursing notes, old chart and ekg, as well as notes from prior ED visits. Patient presented for likely hematemesis with aspiration, patient appears to be near death, I will consult the family.  Reportedly she is a full code   Procedures  [name] was evaluated in Emergency Department on 03-08-20 for the symptoms described in the history of present illness. She was evaluated in the context of the global COVID-19 pandemic, which necessitated consideration that the patient might be at risk for infection with the SARS-CoV-2 virus that causes COVID-19. Institutional protocols and algorithms that pertain to the evaluation of patients at risk for COVID-19 are in a state of rapid change based on information released by regulatory bodies including the CDC and federal and state organizations. These policies and algorithms were followed during the patient's care in the ED.  ____________________________________________   DIFFERENTIAL DIAGNOSIS   Massive hematemesis, anemia, aspiration pneumonia, hypoxia, acidemia, shock  FINAL ASSESSMENT AND PLAN  Hematemesis, aspiration pneumonia, hypoxia   Plan: The patient had presented for hematemesis with certain aspiration and resulting hypoxia with altered mental status.  Patient appears near death on arrival, family states she is a DO NOT RESUSCITATE and they wish for comfort measures.  I have ordered for morphine for comfort as she is in distress at this point.   Laurence Aly, MD    Note:  This note was generated in part or whole with voice recognition software. Voice recognition is usually quite accurate but there are transcription errors that can and very often do occur. I apologize for any typographical errors that were not detected and corrected.     Earleen Newport, MD Mar 08, 2020 [phone]

## 2020-03-02 DEATH — deceased

## 2020-11-11 IMAGING — CT CT CHEST W/O CM
2 of 4 series · 12 of 36 positions shown, 15 images · non-contrast
Comparison: Portable chest x-ray of 06/23/2017

CLINICAL DATA: Fell at home several days ago with right-sided chest
wall and right upper quadrant pain, some shortness of breath

EXAM:
CT CHEST, ABDOMEN AND PELVIS WITHOUT CONTRAST
TECHNIQUE: Multidetector CT imaging of the chest, abdomen and pelvis was
performed following the standard protocol without IV contrast.

[Series 2: cap wo st · axial · 0.61mm/px · z∈[-836,-386]mm · 9 of 110 slices shown, 12 images]
[im 10/110  mediastinal]
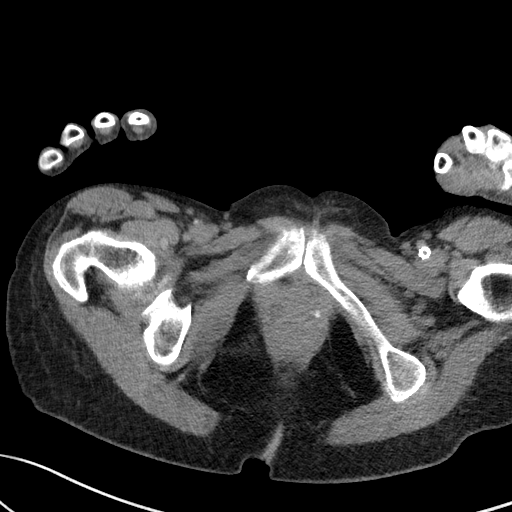
[im 10/110  lung]
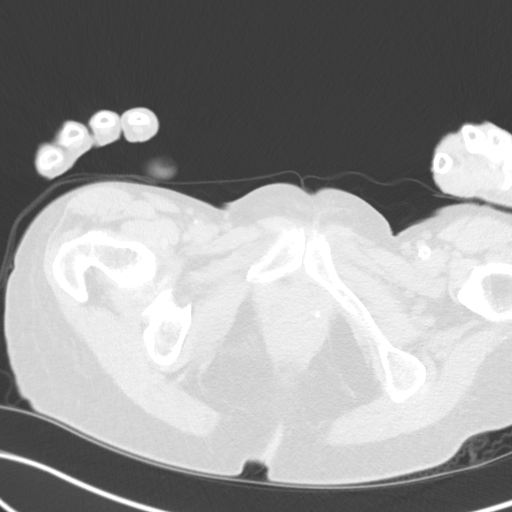
[im 19/110  lung]
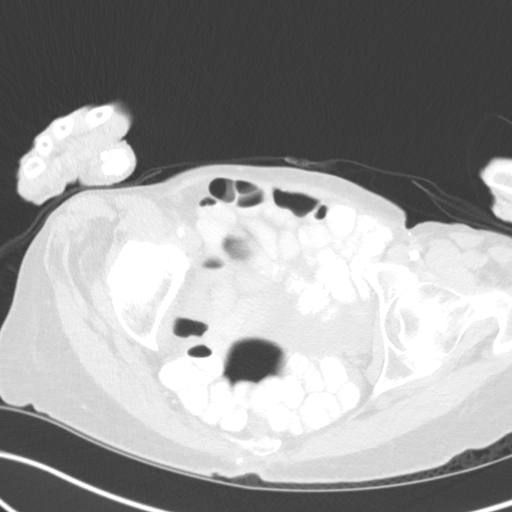
[im 37/110  lung]
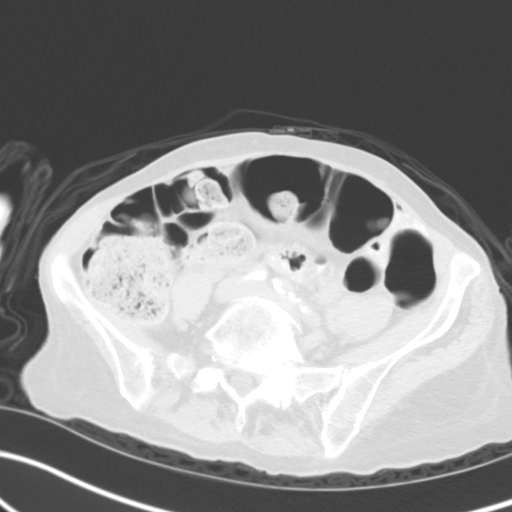
[im 46/110  lung]
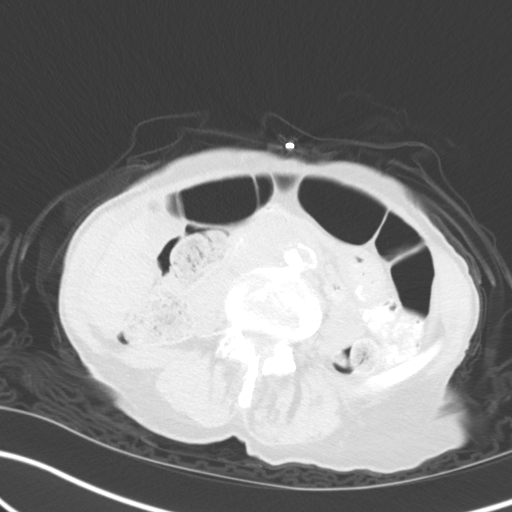
[im 55/110  mediastinal]
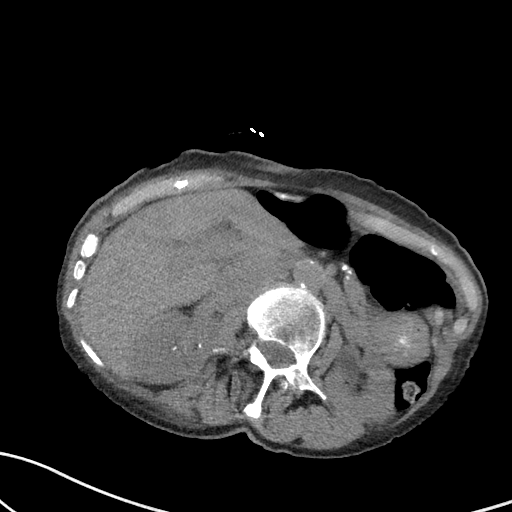
[im 55/110  lung]
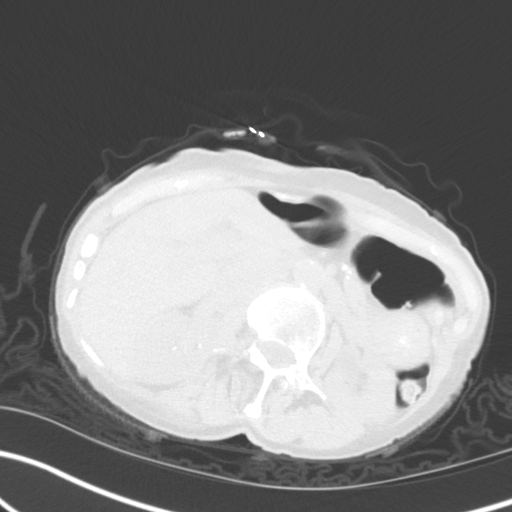
[im 64/110  lung]
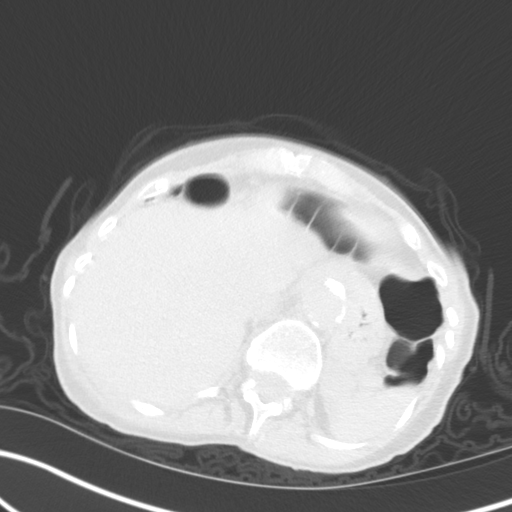
[im 73/110  lung]
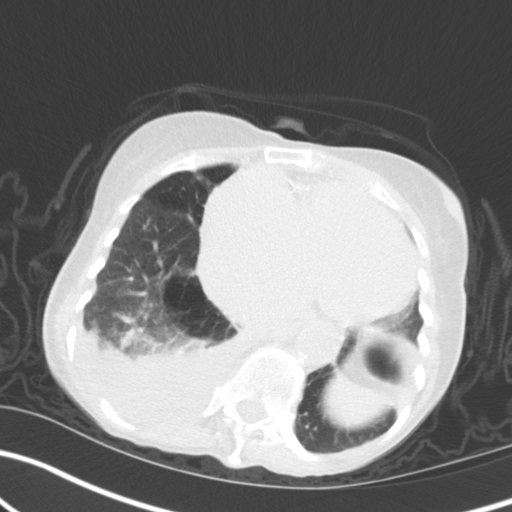
[im 91/110  lung]
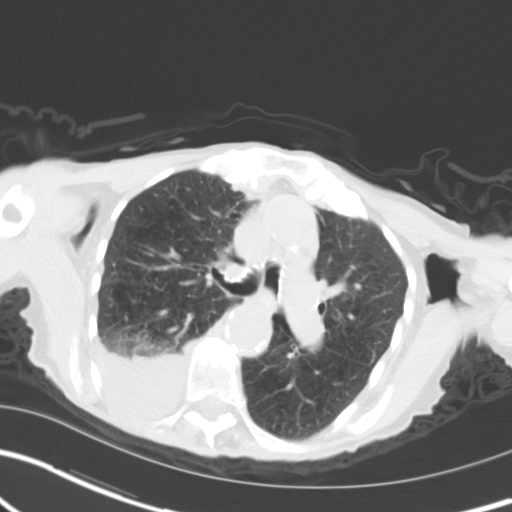
[im 100/110  mediastinal]
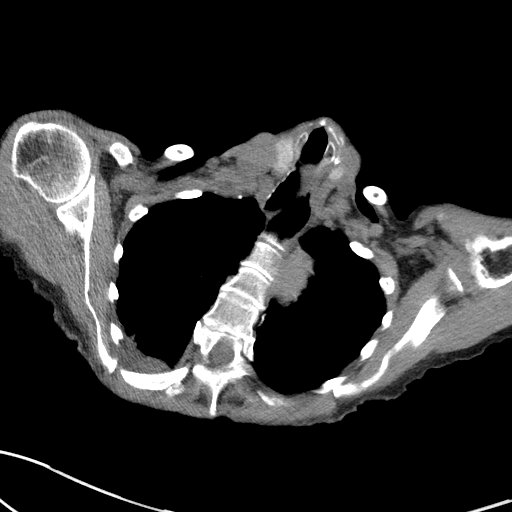
[im 100/110  lung]
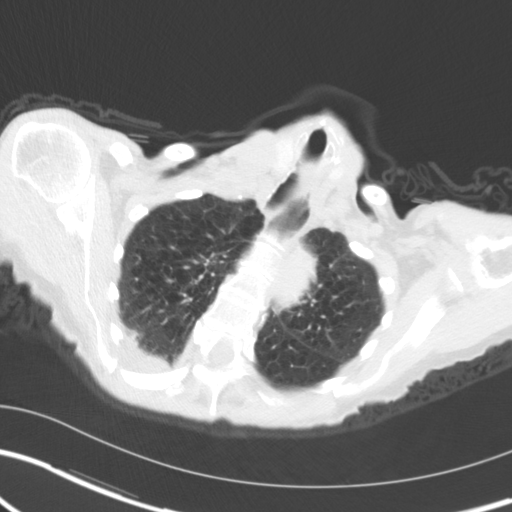

[Series 6: coronal · coronal · 0.64mm/px · 3 of 130 slices shown]
[im 26/130  lung]
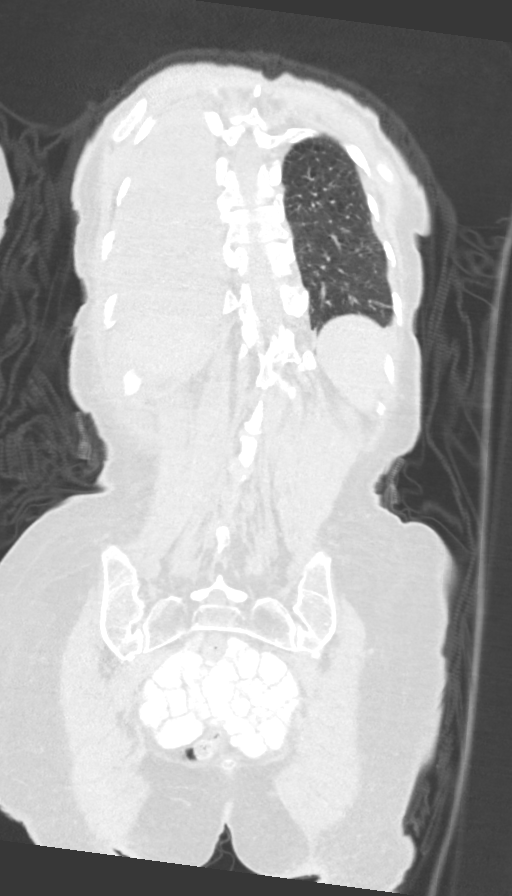
[im 52/130  lung]
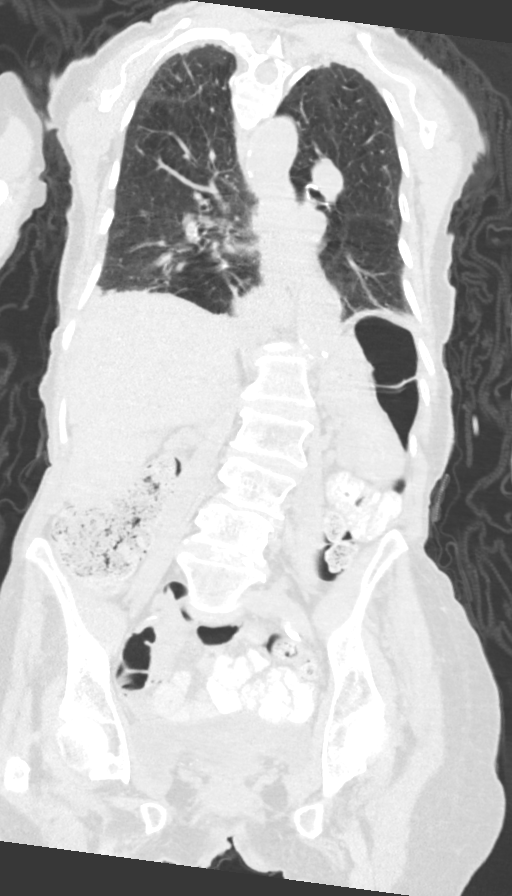
[im 78/130  lung]
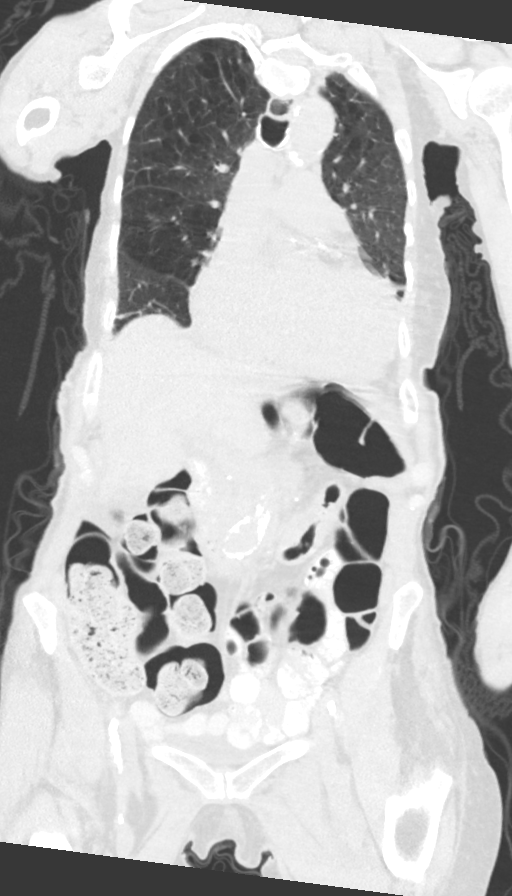

[12 of 36 positions shown; findings below may reference images not displayed]

FINDINGS: CT CHEST FINDINGS

Cardiovascular: There is moderate cardiomegaly present. No
pericardial effusion is seen. Coronary artery calcifications are
noted primarily within the distribution of the left anterior
descending coronary artery. Moderate thoracic aortic atherosclerosis
is present. The pulmonary artery segments are somewhat prominent
which may indicate a degree of pulmonary arterial hypertension.

Mediastinum/Nodes: No mediastinal or hilar adenopathy is seen.
Thyroid gland is unremarkable. No hiatal hernia is noted.

Lungs/Pleura: The lungs are hyperaerated with probable centrilobular
emphysema. No pneumonia is seen and no pneumothorax is noted.
However, there is a moderate size right pleural effusion present
with with associated basilar atelectasis.

Musculoskeletal: On bone window images are fractures involving the
right sixth, seventh, eighth, ninth, and tenth ribs laterally.

CT ABDOMEN PELVIS FINDINGS

Hepatobiliary: The liver is unremarkable in the unenhanced state. No
calcified gallstones are seen.

Pancreas: The pancreas is difficult to visualize with very little
fat present but no mass or ductal dilatation is noted.

Spleen: The spleen is unremarkable and intact.

Adrenals/Urinary Tract: The adrenal glands are unremarkable. There
is slight fullness of both pelvocaliceal system some of which may be
due to parapelvic cysts, but mild hydronephrosis cannot be excluded
on this study without contrast media. There are small renal
calcifications present which may represent calculi or renovascular
calcifications. The ureters are difficult to visualize but no
dilatation is seen. The urinary bladder is moderately urine
distended with no abnormality noted.

Stomach/Bowel: The stomach is largely decompressed. No small bowel
dilatation or edema is seen. Much of the colon is air-filled and
there is a moderate amount of feces present throughout the colon. No
abnormality is noted within the right lower quadrant.

Vascular/Lymphatic: Significant abdominal aortic atherosclerosis is
present. The distal abdominal aorta measures 2.6 cm in maximum
diameter. No adenopathy is seen.

Reproductive: The uterus is not definitely seen and may be atrophic
or possibly previously resected but that history is not given. No
free fluid is seen within the pelvis. No adnexal lesion is seen.

Other: No abdominal wall hernia is seen.

Musculoskeletal: There is a thoracolumbar kyphoscoliosis present
with associated degenerative change. No compression deformity is
seen.
IMPRESSION: 1. Moderate sized right pleural effusion with adjacent fractures
involving the anterolateral right sixth, seventh, eighth, ninth, and
tenth ribs. No pneumothorax.
2. Slight fullness of the pelvocaliceal systems may be due to
parapelvic renal cysts. Mild hydronephrosis cannot be excluded.
3. Significant thoracic and abdominal aortic atherosclerosis and
diffuse coronary artery disease.
4. Thoracolumbar kyphoscoliosis with associated degenerative change.
5. Centrilobular emphysema.

## 2020-11-13 IMAGING — DX DG CHEST 1V
1 series · 1 of 1 positions shown · non-contrast
Comparison: Chest CT 10/27/2018, chest radiograph 06/23/2018.

CLINICAL DATA: [AGE] female status post recent fall at home.
Right lower rib fractures with right pleural effusion.

EXAM:
CHEST  1 VIEW

[chest ap]
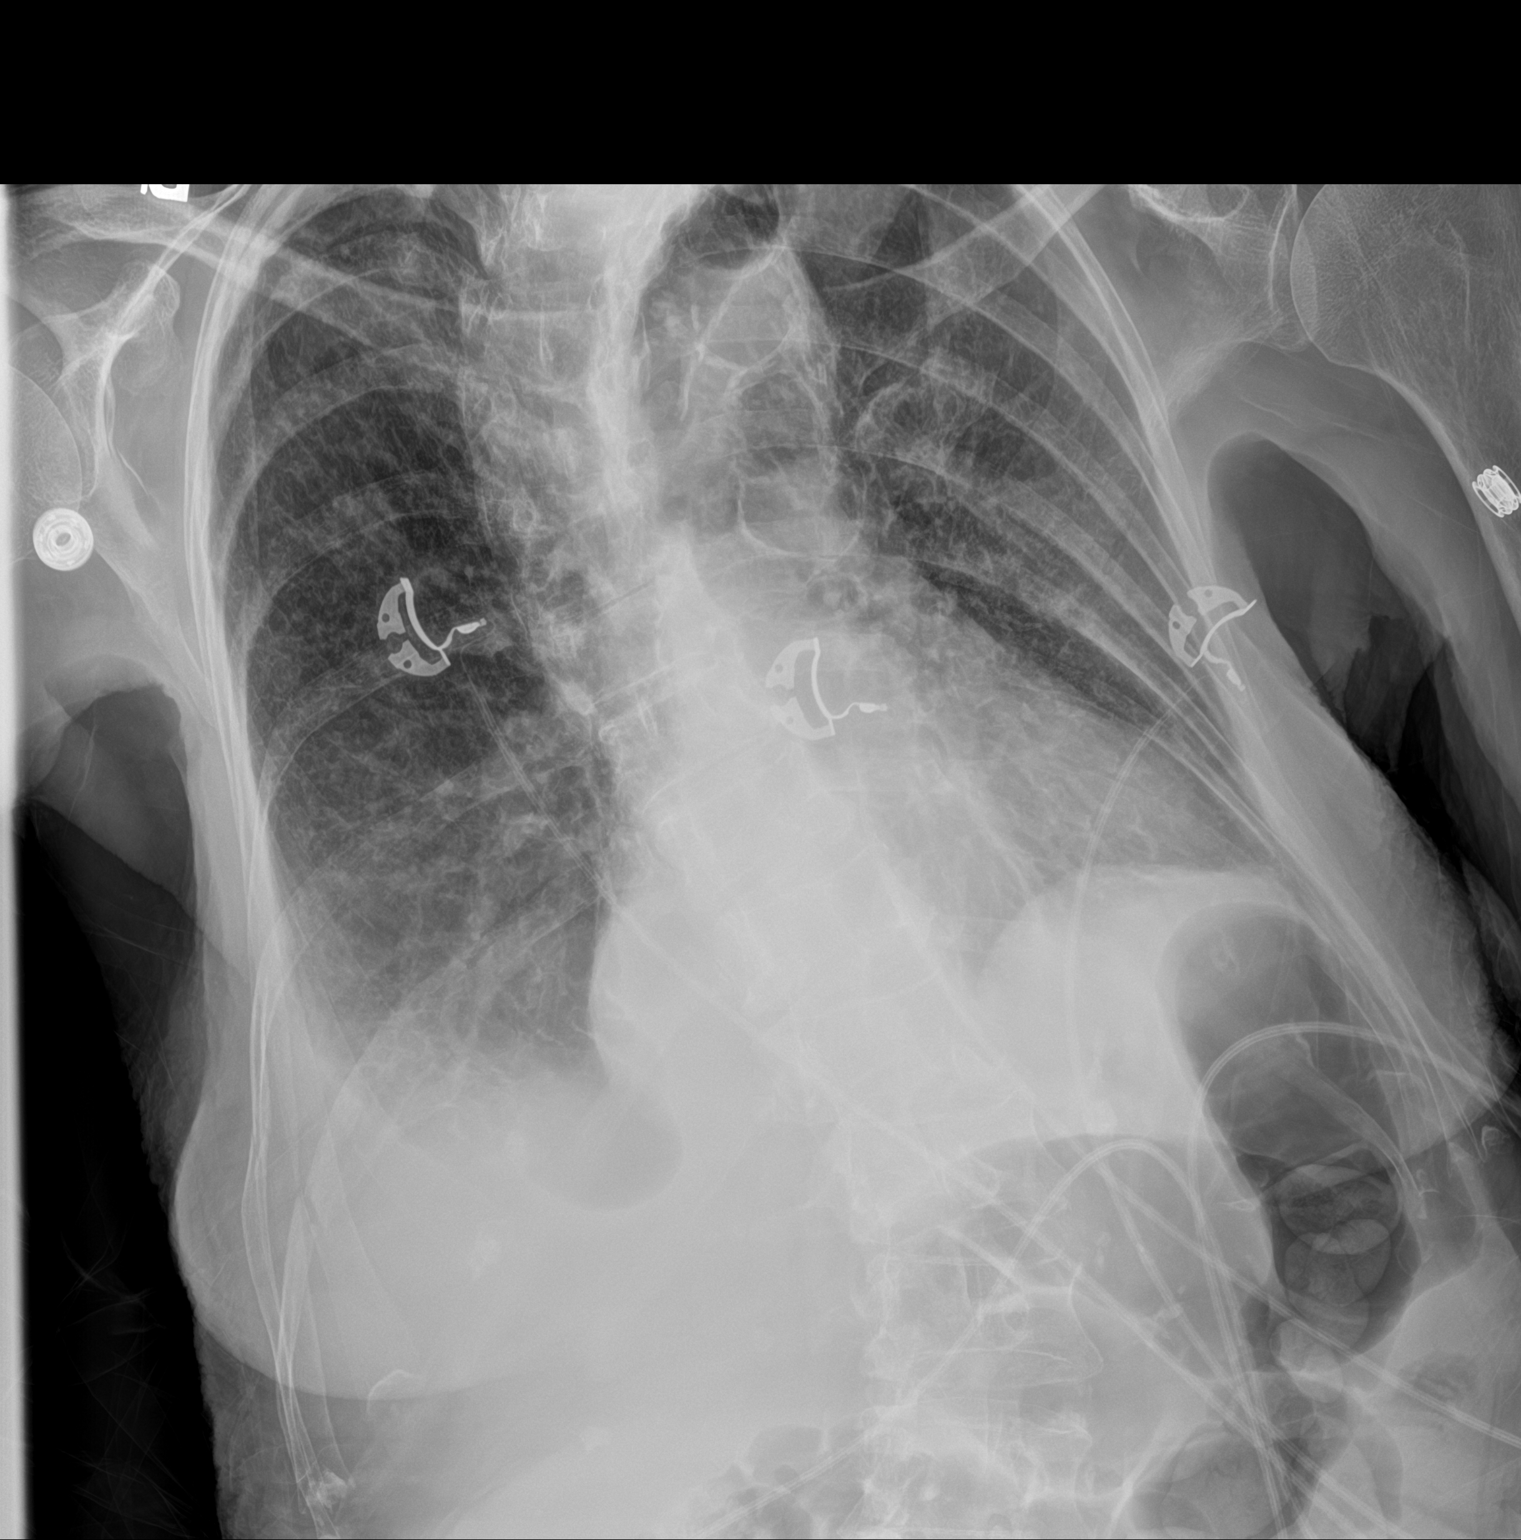

[1 of 1 positions shown; findings below may reference images not displayed]

FINDINGS: Portable AP semi upright view at 0404 hours. Somewhat large lung
volumes again noted. Small right pleural effusion appears stable
from the CT 2 days ago. Right lower rib fractures were better
demonstrated by CT. No pneumothorax. Stable cardiomegaly and
mediastinal contours. Chronic pulmonary interstitial opacity common
no overt edema. No confluent opacity in the left lung. Calcified
aortic atherosclerosis. Negative visible bowel gas pattern.
IMPRESSION: 1. Stable small right pleural effusion. No pneumothorax.
2. Right lower rib fractures better demonstrated by CT.
3. No new cardiopulmonary abnormality.
